# Patient Record
Sex: Female | Born: 1950 | Race: White | Hispanic: No | Marital: Married | Smoking: Never smoker
Health system: Southern US, Community
[De-identification: ages and names within clinical notes are randomized; demographics above are authoritative.]

## PROBLEM LIST (undated history)

## (undated) DIAGNOSIS — J189 Pneumonia, unspecified organism: Secondary | ICD-10-CM

## (undated) DIAGNOSIS — M8580 Other specified disorders of bone density and structure, unspecified site: Secondary | ICD-10-CM

## (undated) DIAGNOSIS — Z8719 Personal history of other diseases of the digestive system: Secondary | ICD-10-CM

## (undated) DIAGNOSIS — G43909 Migraine, unspecified, not intractable, without status migrainosus: Secondary | ICD-10-CM

## (undated) DIAGNOSIS — R569 Unspecified convulsions: Secondary | ICD-10-CM

## (undated) DIAGNOSIS — E785 Hyperlipidemia, unspecified: Secondary | ICD-10-CM

## (undated) DIAGNOSIS — J479 Bronchiectasis, uncomplicated: Secondary | ICD-10-CM

## (undated) DIAGNOSIS — J449 Chronic obstructive pulmonary disease, unspecified: Secondary | ICD-10-CM

## (undated) DIAGNOSIS — A310 Pulmonary mycobacterial infection: Secondary | ICD-10-CM

## (undated) DIAGNOSIS — T7840XA Allergy, unspecified, initial encounter: Secondary | ICD-10-CM

## (undated) HISTORY — DX: Bronchiectasis, uncomplicated: J47.9

## (undated) HISTORY — DX: Unspecified convulsions: R56.9

## (undated) HISTORY — DX: Personal history of other diseases of the digestive system: Z87.19

## (undated) HISTORY — DX: Migraine, unspecified, not intractable, without status migrainosus: G43.909

## (undated) HISTORY — DX: Pneumonia, unspecified organism: J18.9

## (undated) HISTORY — DX: Chronic obstructive pulmonary disease, unspecified: J44.9

## (undated) HISTORY — DX: Hyperlipidemia, unspecified: E78.5

## (undated) HISTORY — DX: Pulmonary mycobacterial infection: A31.0

## (undated) HISTORY — DX: Allergy, unspecified, initial encounter: T78.40XA

## (undated) HISTORY — PX: EYE SURGERY: SHX253

## (undated) HISTORY — PX: SPINE SURGERY: SHX786

## (undated) HISTORY — PX: BREAST SURGERY: SHX581

## (undated) HISTORY — DX: Other specified disorders of bone density and structure, unspecified site: M85.80

---

## 1981-03-21 HISTORY — PX: CARPAL TUNNEL RELEASE: SHX101

## 1995-03-22 HISTORY — PX: BREAST EXCISIONAL BIOPSY: SUR124

## 2001-05-09 ENCOUNTER — Inpatient Hospital Stay (HOSPITAL_COMMUNITY): Admission: RE | Admit: 2001-05-09 | Discharge: 2001-05-11 | Payer: Self-pay | Admitting: Obstetrics & Gynecology

## 2001-05-09 ENCOUNTER — Encounter (INDEPENDENT_AMBULATORY_CARE_PROVIDER_SITE_OTHER): Payer: Self-pay

## 2002-01-04 ENCOUNTER — Other Ambulatory Visit: Admission: RE | Admit: 2002-01-04 | Discharge: 2002-01-04 | Payer: Self-pay | Admitting: Obstetrics & Gynecology

## 2002-04-26 ENCOUNTER — Encounter: Payer: Self-pay | Admitting: General Surgery

## 2002-04-26 ENCOUNTER — Encounter: Admission: RE | Admit: 2002-04-26 | Discharge: 2002-04-26 | Payer: Self-pay | Admitting: General Surgery

## 2003-03-22 HISTORY — PX: BREAST BIOPSY: SHX20

## 2003-05-02 ENCOUNTER — Other Ambulatory Visit: Admission: RE | Admit: 2003-05-02 | Discharge: 2003-05-02 | Payer: Self-pay | Admitting: Obstetrics & Gynecology

## 2004-02-19 ENCOUNTER — Ambulatory Visit: Payer: Self-pay | Admitting: Family Medicine

## 2004-04-01 ENCOUNTER — Ambulatory Visit: Payer: Self-pay | Admitting: Family Medicine

## 2004-05-27 ENCOUNTER — Ambulatory Visit: Payer: Self-pay

## 2005-02-03 ENCOUNTER — Ambulatory Visit: Payer: Self-pay | Admitting: Family Medicine

## 2005-05-13 ENCOUNTER — Ambulatory Visit: Payer: Self-pay | Admitting: Family Medicine

## 2005-05-17 ENCOUNTER — Ambulatory Visit: Payer: Self-pay | Admitting: Family Medicine

## 2005-06-08 ENCOUNTER — Ambulatory Visit: Payer: Self-pay | Admitting: Family Medicine

## 2005-08-24 ENCOUNTER — Ambulatory Visit: Payer: Self-pay | Admitting: Family Medicine

## 2005-09-15 ENCOUNTER — Ambulatory Visit: Payer: Self-pay | Admitting: Family Medicine

## 2005-09-22 ENCOUNTER — Ambulatory Visit: Payer: Self-pay | Admitting: Family Medicine

## 2005-10-05 ENCOUNTER — Ambulatory Visit: Payer: Self-pay | Admitting: Family Medicine

## 2005-11-03 ENCOUNTER — Ambulatory Visit: Payer: Self-pay

## 2005-11-25 ENCOUNTER — Ambulatory Visit: Payer: Self-pay | Admitting: Family Medicine

## 2006-06-23 ENCOUNTER — Ambulatory Visit: Payer: Self-pay | Admitting: Family Medicine

## 2006-07-28 ENCOUNTER — Telehealth (INDEPENDENT_AMBULATORY_CARE_PROVIDER_SITE_OTHER): Payer: Self-pay | Admitting: *Deleted

## 2006-11-02 ENCOUNTER — Ambulatory Visit: Payer: Self-pay

## 2006-11-30 ENCOUNTER — Ambulatory Visit: Payer: Self-pay | Admitting: Neurosurgery

## 2006-12-13 ENCOUNTER — Telehealth: Payer: Self-pay | Admitting: Family Medicine

## 2006-12-14 ENCOUNTER — Encounter (INDEPENDENT_AMBULATORY_CARE_PROVIDER_SITE_OTHER): Payer: Self-pay | Admitting: *Deleted

## 2007-03-01 DIAGNOSIS — K5730 Diverticulosis of large intestine without perforation or abscess without bleeding: Secondary | ICD-10-CM | POA: Insufficient documentation

## 2007-03-01 DIAGNOSIS — R569 Unspecified convulsions: Secondary | ICD-10-CM | POA: Insufficient documentation

## 2007-03-01 DIAGNOSIS — Z87898 Personal history of other specified conditions: Secondary | ICD-10-CM | POA: Insufficient documentation

## 2007-03-01 DIAGNOSIS — J309 Allergic rhinitis, unspecified: Secondary | ICD-10-CM | POA: Insufficient documentation

## 2007-03-02 ENCOUNTER — Ambulatory Visit: Payer: Self-pay | Admitting: Family Medicine

## 2007-03-02 DIAGNOSIS — J019 Acute sinusitis, unspecified: Secondary | ICD-10-CM | POA: Insufficient documentation

## 2007-03-02 DIAGNOSIS — M5030 Other cervical disc degeneration, unspecified cervical region: Secondary | ICD-10-CM | POA: Insufficient documentation

## 2007-03-02 DIAGNOSIS — M949 Disorder of cartilage, unspecified: Secondary | ICD-10-CM

## 2007-03-02 DIAGNOSIS — M899 Disorder of bone, unspecified: Secondary | ICD-10-CM | POA: Insufficient documentation

## 2007-03-16 ENCOUNTER — Telehealth: Payer: Self-pay | Admitting: Family Medicine

## 2007-05-02 ENCOUNTER — Telehealth: Payer: Self-pay | Admitting: Family Medicine

## 2007-05-10 ENCOUNTER — Ambulatory Visit: Payer: Self-pay | Admitting: Family Medicine

## 2007-05-10 DIAGNOSIS — J441 Chronic obstructive pulmonary disease with (acute) exacerbation: Secondary | ICD-10-CM | POA: Insufficient documentation

## 2007-05-10 DIAGNOSIS — J45901 Unspecified asthma with (acute) exacerbation: Secondary | ICD-10-CM

## 2007-05-14 ENCOUNTER — Telehealth: Payer: Self-pay | Admitting: Family Medicine

## 2007-06-13 ENCOUNTER — Telehealth: Payer: Self-pay | Admitting: Family Medicine

## 2007-06-18 ENCOUNTER — Telehealth: Payer: Self-pay | Admitting: Family Medicine

## 2007-06-21 ENCOUNTER — Telehealth: Payer: Self-pay | Admitting: Family Medicine

## 2007-09-05 ENCOUNTER — Telehealth: Payer: Self-pay | Admitting: Family Medicine

## 2007-11-29 ENCOUNTER — Ambulatory Visit: Payer: Self-pay | Admitting: Obstetrics & Gynecology

## 2008-05-01 ENCOUNTER — Ambulatory Visit: Payer: Self-pay

## 2008-07-24 ENCOUNTER — Ambulatory Visit: Payer: Self-pay

## 2008-12-25 ENCOUNTER — Ambulatory Visit: Payer: Self-pay

## 2009-03-19 ENCOUNTER — Other Ambulatory Visit: Payer: Self-pay | Admitting: Internal Medicine

## 2010-01-07 ENCOUNTER — Ambulatory Visit: Payer: Self-pay | Admitting: Obstetrics & Gynecology

## 2010-01-28 ENCOUNTER — Ambulatory Visit: Payer: Self-pay

## 2010-01-29 ENCOUNTER — Encounter: Admission: RE | Admit: 2010-01-29 | Discharge: 2010-01-29 | Payer: Self-pay | Admitting: Neurosurgery

## 2010-02-25 ENCOUNTER — Other Ambulatory Visit: Payer: Self-pay | Admitting: Internal Medicine

## 2010-03-05 ENCOUNTER — Ambulatory Visit: Payer: Self-pay | Admitting: Internal Medicine

## 2010-07-02 ENCOUNTER — Other Ambulatory Visit: Payer: Self-pay | Admitting: Neurosurgery

## 2010-07-02 DIAGNOSIS — M542 Cervicalgia: Secondary | ICD-10-CM

## 2010-07-02 DIAGNOSIS — IMO0002 Reserved for concepts with insufficient information to code with codable children: Secondary | ICD-10-CM

## 2010-07-09 ENCOUNTER — Other Ambulatory Visit: Payer: Self-pay

## 2010-07-20 ENCOUNTER — Ambulatory Visit
Admission: RE | Admit: 2010-07-20 | Discharge: 2010-07-20 | Disposition: A | Discharge status: Home / Self Care | Payer: BC Managed Care – PPO | Source: Ambulatory Visit | Attending: Neurosurgery | Admitting: Neurosurgery

## 2010-07-20 DIAGNOSIS — M542 Cervicalgia: Secondary | ICD-10-CM

## 2010-07-20 DIAGNOSIS — IMO0002 Reserved for concepts with insufficient information to code with codable children: Secondary | ICD-10-CM

## 2010-08-06 NOTE — Op Note (Signed)
[facility]  Patient:    Barbara Odom, MODE Visit Number: [id number] MRN: [id number]          Service Type: Attending:  Genia Del, M.D. Dictated by:   Genia Del, M.D. Proc. 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  INTERVENTION:                 Uterine myomectomy with cauterization of pelvic endometriosis.  SURGEON:                      Genia Del, M.D.  ASSISTANT:                    Cordelia Pen A. Rosalio Macadamia, M.D.  ANESTHESIOLOGIST:             Belva Agee, M.D.  PROCEDURE:                    Under general anesthesia the patient is in dorsal decubitus position.  She is prepped with Betadine on the abdominal, suprapubic, vulvar, and vaginal areas.  A bladder catheter is inserted and the patient is draped as usual.  We make a Pfannenstiel incision with a scalpel. We then open the aponeurosis transversely with electrocautery and Mayo scissors.  We release the aponeurosis from the recti muscle on the midline. We open the parietoperitoneum longitudinally with the Metzenbaum scissors.  An abdominal, pelvic exploration is done.  The liver surfaces are regular and smooth.  The kidneys are normal.  No periaortic lymph node is palpable.  No pathology is felt in the abdominal area.  In the pelvic area a large anterior myoma is present pushing the uterus in a retroverted position.  Both tubes are normal.  Both ovaries are normal except for a small endometrioma on the right side and superficial endometriosis on the left side.  Small lesions of endometriosis are also present superficially at the level of the right cornua and on the posterior aspect  of the uterus.  After retracting the bowels upward with laps and putting in place the Balfour retractor with the bladder blade, we inject the anterior aspect of the uterus with Vasopressin 20 and 30.  We then follow a vertical line from mid anterior to fundal area creating a vertical hysterotomy going through the myometrium down to the myoma. Inferiorly we are far away from the bladder.  We insert the corkscrew at the level of the myoma and started dissecting with the finger and Metzenbaum scissors to free the myoma.  At the deepest area the myoma is adherent to the endometrium.  We find a good plane freeing the myoma from the endometrium. The myoma is removed in one piece and sent to pathology after taking a picture.  We then evaluate the integrity of the endometrium.  It was perforated at the location which was adherent to the myoma.  This was closed with a running locked suture with Vicryl 2-0.  We then fix the endometrium  in its normal anatomic position.  Before closing the endometrium, the integrity of the uterine cavity and the communication to both tubes was verified and adequate.  We then made a few figure-of-eight stitches on the inside of the myometrium to reduce the space there and hopefully reduce the risk of hematoma formation.  We then close the myometrium with figure-of-eight with Vicryl 0. Hemostasis was adequate at that level.  We then made a baseball stitch with Monocryl 2-0 to close the serosa.  Hemostasis was adequate at that level. Cauterization of endometriosis as described at the beginning of this protocol was achieved.  Irrigation and suction of the pelvic cavity was done.  The laps were removed as well as the retractor.  We then close the aponeurosis by two half running suture of Vicryl 0.  Hemostasis was completed at the level of the adipose tissue.  Marcaine 0.25% plain a total of 21 cc was injected subcutaneously.  About half of it was injected at the beginning of  the surgery and the rest at the end of the surgery.  We then close the skin with subcuticular suture with Monocryl 4-0.  The estimated blood loss was 100 cc. The count of sponges and instruments was complete x2.  No complications occurred.  The patient was transferred to recovery room in good status. Dictated by:   Genia Del, M.D. Attending:  Genia Del, M.D. DD:  05/09/01 TD:  05/09/01 Job: [id number]

## 2010-08-06 NOTE — Discharge Summary (Signed)
[facility]  Patient:    Barbara Odom, Barbara Odom Visit Number: [id number] MRN: [id number]          Service Type: GYN Location: 910A [id number] Attending Physician:  Genia Del Dictated by:   Genia Del, M.D. Admit Date:  05/09/2001 Discharge Date: 05/11/2001                             Discharge Summary  ADMITTING DIAGNOSIS:          Symptomatic uterine myoma.  DISCHARGE DIAGNOSES:          1. Symptomatic uterine myoma.                               2. Intramural uterine myoma.                               3. Mild pelvic endometriosis.  OPERATION:                    Uterine myomectomy with cauterization of                               pelvic endometriosis on May 09, 2001.  HOSPITAL COURSE:              The patient was admitted May 09, 2001 for uterine myomectomy by laparotomy. She had an anterior intramural myoma causing pressure and pain in the suprapubic area and menorrhagia. The surgery was performed May 09, 2001. A Pfannenstiel incision was done. An intramural myoma was present measuring about 6 to 7 cm. It was excised by a vertical incision using vasopressin. Both tubes were normal. The ovary on the right side was showing a small endometrioma which was opened and cauterized. Superficial endometriosis was cauterized on the left ovary. She also had a superficial endometriotic lesion on the right cornua and on the posterior aspect of the uterus which were all cauterized. Estimated blood loss was 100 cc. No complication occurred.  On postoperative day #1, the patient was hemodynamically stable but feeling quite nauseated and was vomiting. On exam, no evidence of ileus was present. Her symptoms were therefore attributed most likely to her PCA Dilaudid pump which was stopped.  Her preoperative hemoglobin was 9.8. Her postoperative hemoglobin was 7.6 and a control done about eight hours later was 8.2. The patient was  discharged home on postoperative day #2 in good status. She was discharged home with advice.  DISCHARGE MEDICATIONS:        Iron sulfate, Tylox, and Ibuprofen p.r.n.  DISCHARGE FOLLOWUP:           The patient will follow up at the office in three to four weeks. Dictated by:   Genia Del, M.D. Attending Physician:  Genia Del DD:  05/25/01 TD:  05/28/01 Job: [id number]

## 2010-09-24 ENCOUNTER — Ambulatory Visit: Payer: Self-pay | Admitting: Neurosurgery

## 2010-10-20 HISTORY — PX: CERVICAL DISCECTOMY: SHX98

## 2010-11-13 LAB — HM COLONOSCOPY: HM COLON: NORMAL

## 2010-12-07 ENCOUNTER — Other Ambulatory Visit: Payer: Self-pay | Admitting: *Deleted

## 2010-12-07 NOTE — Telephone Encounter (Signed)
Patient has not been seen by this practice in quite some time.  I checked [facility].  Last entry was 2009.

## 2010-12-07 NOTE — Telephone Encounter (Signed)
That is too long ago - I would deny it - would need to re establish

## 2010-12-16 ENCOUNTER — Other Ambulatory Visit: Payer: Self-pay | Admitting: *Deleted

## 2010-12-20 ENCOUNTER — Other Ambulatory Visit: Payer: Self-pay | Admitting: Internal Medicine

## 2010-12-20 MED ORDER — FLUTICASONE-SALMETEROL 100-50 MCG/DOSE IN AEPB: 1.0000 | INHALATION_SPRAY | Freq: Two times a day (BID) | RESPIRATORY_TRACT | Status: DC

## 2011-01-12 ENCOUNTER — Other Ambulatory Visit: Payer: Self-pay | Admitting: Family Medicine

## 2011-01-12 MED ORDER — RIZATRIPTAN BENZOATE 10 MG PO TABS: 10.0000 mg | ORAL_TABLET | Freq: Once | ORAL | Status: DC | PRN

## 2011-01-12 NOTE — Telephone Encounter (Signed)
Call patient when the medication is sent so she can pay for it . Please send RX to [facility]) fax # [phone].

## 2011-01-21 ENCOUNTER — Other Ambulatory Visit: Payer: Self-pay | Admitting: Internal Medicine

## 2011-01-21 NOTE — Telephone Encounter (Signed)
[phone]   Pt needs refill on phenobarb 60mg  1 day last refill 10/11/10 90 day supply [facility]   Fax #[phone] Pt has appointment for cpx 12/5  Pt only has 12 pills left

## 2011-01-26 MED ORDER — PHENOBARBITAL 60 MG PO TABS: 60.0000 mg | ORAL_TABLET | Freq: Every day | ORAL | Status: DC

## 2011-01-26 NOTE — Telephone Encounter (Signed)
Pt will be out of med before mail order will arrive, can 30 day supply be called into local pharm? [facility] [address]

## 2011-01-28 ENCOUNTER — Telehealth: Payer: Self-pay | Admitting: Internal Medicine

## 2011-01-28 MED ORDER — PHENOBARBITAL 60 MG PO TABS: 60.0000 mg | ORAL_TABLET | Freq: Every day | ORAL | Status: DC

## 2011-01-28 NOTE — Telephone Encounter (Signed)
[facility]

## 2011-01-28 NOTE — Telephone Encounter (Signed)
Patient called in today to let us know she is down to 5 pills of her phenobarb 60mg , she states that she has spoken with Zella Ball on 2 different occasions asking this medication to be called into [facility] their number is [phone].  I did see documentation that the medication had been printed, but not sure if it was called/faxed in to pharmacy.  Patient is requesting 15 pills [facility].  I apologize for the delay, I have asked Maralyn Sago to call into pharmacy for patient.

## 2011-01-28 NOTE — Telephone Encounter (Signed)
I faxed her 90 day Rx to [facility] this morning.

## 2011-01-28 NOTE — Telephone Encounter (Signed)
I advised patient that her medication had been called in.  And that her 90 day supply had been faxed into [facility].

## 2011-02-23 ENCOUNTER — Encounter: Payer: Self-pay | Admitting: Internal Medicine

## 2011-02-23 ENCOUNTER — Ambulatory Visit (INDEPENDENT_AMBULATORY_CARE_PROVIDER_SITE_OTHER): Payer: BC Managed Care – PPO | Admitting: Internal Medicine

## 2011-02-23 DIAGNOSIS — G4700 Insomnia, unspecified: Secondary | ICD-10-CM

## 2011-02-23 DIAGNOSIS — Z124 Encounter for screening for malignant neoplasm of cervix: Secondary | ICD-10-CM | POA: Insufficient documentation

## 2011-02-23 DIAGNOSIS — R197 Diarrhea, unspecified: Secondary | ICD-10-CM

## 2011-02-23 DIAGNOSIS — M545 Low back pain, unspecified: Secondary | ICD-10-CM

## 2011-02-23 DIAGNOSIS — Z1211 Encounter for screening for malignant neoplasm of colon: Secondary | ICD-10-CM | POA: Insufficient documentation

## 2011-02-23 DIAGNOSIS — R569 Unspecified convulsions: Secondary | ICD-10-CM

## 2011-02-23 DIAGNOSIS — J069 Acute upper respiratory infection, unspecified: Secondary | ICD-10-CM

## 2011-02-23 DIAGNOSIS — N390 Urinary tract infection, site not specified: Secondary | ICD-10-CM

## 2011-02-23 DIAGNOSIS — F419 Anxiety disorder, unspecified: Secondary | ICD-10-CM

## 2011-02-23 DIAGNOSIS — M5030 Other cervical disc degeneration, unspecified cervical region: Secondary | ICD-10-CM

## 2011-02-23 MED ORDER — PROPRANOLOL HCL 20 MG PO TABS: 20.0000 mg | ORAL_TABLET | Freq: Three times a day (TID) | ORAL | Status: DC

## 2011-02-23 MED ORDER — ZOLPIDEM TARTRATE 10 MG PO TABS: 10.0000 mg | ORAL_TABLET | Freq: Every evening | ORAL | Status: DC | PRN

## 2011-02-23 MED ORDER — AZITHROMYCIN 500 MG PO TABS: 500.0000 mg | ORAL_TABLET | Freq: Every day | ORAL | Status: AC

## 2011-02-23 MED ORDER — HYOSCYAMINE SULFATE 0.125 MG SL SUBL: 0.1250 mg | SUBLINGUAL_TABLET | Freq: Every day | SUBLINGUAL | Status: DC | PRN

## 2011-02-23 MED ORDER — CIPROFLOXACIN HCL 250 MG PO TABS: 250.0000 mg | ORAL_TABLET | Freq: Two times a day (BID) | ORAL | Status: AC

## 2011-02-23 MED ORDER — METRONIDAZOLE 500 MG PO TABS: 500.0000 mg | ORAL_TABLET | Freq: Three times a day (TID) | ORAL | Status: AC

## 2011-02-23 NOTE — Assessment & Plan Note (Addendum)
S/p successful surgery Summer 2012 by Ernest Botero.    

## 2011-02-26 ENCOUNTER — Encounter: Payer: Self-pay | Admitting: Internal Medicine

## 2011-02-26 DIAGNOSIS — M8580 Other specified disorders of bone density and structure, unspecified site: Secondary | ICD-10-CM | POA: Insufficient documentation

## 2011-02-26 DIAGNOSIS — G43909 Migraine, unspecified, not intractable, without status migrainosus: Secondary | ICD-10-CM | POA: Insufficient documentation

## 2011-02-26 DIAGNOSIS — G40909 Epilepsy, unspecified, not intractable, without status epilepticus: Secondary | ICD-10-CM | POA: Insufficient documentation

## 2011-02-26 DIAGNOSIS — M810 Age-related osteoporosis without current pathological fracture: Secondary | ICD-10-CM | POA: Insufficient documentation

## 2011-02-26 DIAGNOSIS — M545 Low back pain, unspecified: Secondary | ICD-10-CM | POA: Insufficient documentation

## 2011-02-26 DIAGNOSIS — Z8719 Personal history of other diseases of the digestive system: Secondary | ICD-10-CM | POA: Insufficient documentation

## 2011-02-26 NOTE — Assessment & Plan Note (Signed)
She continues to take low dose phenarbital for history of seizure disorder as an adolescent.

## 2011-02-26 NOTE — Assessment & Plan Note (Signed)
Her pain is moderate and radiates to buttocks but not below.  Exam is normal.  She is requesting a Medrol dose pack since she has had no relief with analgesics.

## 2011-02-26 NOTE — Progress Notes (Signed)
  Subjective:    Patient ID: Barbara Odom, female    DOB: May 22, 1950, 60 y.o.   MRN: [id number]  HPI  Ms. Moder is a healthy 60 yo white female with a history of migraine headaches and cervicalgia with bilateral radiculopathy who presents for follow up of medical conditions.  She underwent cervical diskectomy in August by Hilda Lias with complete resolution of pain and radiculopathy.  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  She has been pain free until a few days ago when she developed low back pain after doing some heavy labor in the yard,  She has had persistent and decreased ROM for the past week. No radiculopathy.  Past Medical History  Diagnosis Date  . 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 seasonal rhinitis   . Migraine headache   . History of diverticulitis of colon   . Osteopenia     Current Outpatient Prescriptions on File Prior to Visit  Medication Sig Dispense Refill  . Fluticasone-Salmeterol (ADVAIR DISKUS) 100-50 MCG/DOSE AEPB Inhale 1 puff into the lungs 2 (two) times daily.  60 each  3  . PHENObarbital (LUMINAL) 60 MG tablet Take 1 tablet (60 mg total) by mouth daily.  30 tablet  0  . rizatriptan (MAXALT) 10 MG tablet Take 1 tablet (10 mg total) by mouth once as needed for migraine. May repeat in 2 hours if needed  30 tablet  2     Review of Systems  Constitutional: Negative for fever, chills and unexpected weight change.  HENT: Negative for hearing loss, ear pain, nosebleeds, congestion, sore throat, facial swelling, rhinorrhea, sneezing, mouth sores, trouble swallowing, neck pain, neck stiffness, voice change, postnasal drip, sinus pressure, tinnitus and ear discharge.   Eyes: Negative for pain, discharge, redness and visual disturbance.  Respiratory: Negative for cough, chest tightness, shortness of breath, wheezing and stridor.   Cardiovascular: Negative for chest pain, palpitations and leg swelling.  Musculoskeletal:  Negative for myalgias and arthralgias.  Skin: Negative for color change and rash.  Neurological: Negative for dizziness, weakness, light-headedness and headaches.  Hematological: Negative for adenopathy.       Objective:   Physical Exam  Constitutional: She is oriented to person, place, and time. She appears well-developed and well-nourished.  HENT:  Mouth/Throat: Oropharynx is clear and moist.  Eyes: EOM are normal. Pupils are equal, round, and reactive to light. No scleral icterus.  Neck: Normal range of motion. Neck supple. No JVD present. No thyromegaly present.  Cardiovascular: Normal rate, regular rhythm, normal heart sounds and intact distal pulses.   Pulmonary/Chest: Effort normal and breath sounds normal.  Abdominal: Soft. Bowel sounds are normal. She exhibits no mass. There is no tenderness.  Musculoskeletal: Normal range of motion. She exhibits no edema.  Lymphadenopathy:    She has no cervical adenopathy.  Neurological: She is alert and oriented to person, place, and time.  Skin: Skin is warm and dry.  Psychiatric: She has a normal mood and affect.          Assessment & Plan:

## 2011-02-28 ENCOUNTER — Telehealth: Payer: Self-pay | Admitting: Internal Medicine

## 2011-02-28 ENCOUNTER — Other Ambulatory Visit: Payer: Self-pay | Admitting: Internal Medicine

## 2011-02-28 DIAGNOSIS — M549 Dorsalgia, unspecified: Secondary | ICD-10-CM

## 2011-02-28 MED ORDER — PREDNISONE (PAK) 10 MG PO TABS: ORAL_TABLET | ORAL | Status: DC

## 2011-02-28 NOTE — Telephone Encounter (Signed)
Patient called and stated at her office visit she thought [facility]  She stated she went over the weekend to pick it up and it wasn't there.  Please advise.

## 2011-03-01 MED ORDER — ZOLPIDEM TARTRATE 5 MG PO TABS: 5.0000 mg | ORAL_TABLET | Freq: Every evening | ORAL | Status: DC | PRN

## 2011-03-01 NOTE — Progress Notes (Signed)
Addended by: Duncan Dull on: 03/01/2011 12:23 PM   Modules accepted: Orders

## 2011-03-07 ENCOUNTER — Other Ambulatory Visit: Payer: Self-pay | Admitting: Internal Medicine

## 2011-03-07 DIAGNOSIS — M549 Dorsalgia, unspecified: Secondary | ICD-10-CM

## 2011-03-07 MED ORDER — PREDNISONE (PAK) 10 MG PO TABS: ORAL_TABLET | ORAL | Status: AC

## 2011-03-17 ENCOUNTER — Encounter: Payer: Self-pay | Admitting: Internal Medicine

## 2011-05-12 ENCOUNTER — Other Ambulatory Visit: Payer: Self-pay | Admitting: Internal Medicine

## 2011-05-12 LAB — COMPREHENSIVE METABOLIC PANEL
Albumin: 4.1 g/dL (ref 3.4–5.0)
Alkaline Phosphatase: 44 U/L — ABNORMAL LOW (ref 50–136)
BUN: 17 mg/dL (ref 7–18)
Bilirubin,Total: 0.4 mg/dL (ref 0.2–1.0)
Calcium, Total: 8.8 mg/dL (ref 8.5–10.1)
Chloride: 103 mmol/L (ref 98–107)
Creatinine: 0.76 mg/dL (ref 0.60–1.30)
EGFR (Non-African Amer.): >60
Osmolality: 285 (ref 275–301)
SGPT (ALT): 21 U/L
Sodium: 143 mmol/L (ref 136–145)

## 2011-05-12 LAB — LIPID PANEL
HDL Cholesterol: 81 mg/dL — ABNORMAL HIGH (ref 40–60)
Ldl Cholesterol, Calc: 103 mg/dL — ABNORMAL HIGH (ref 0–100)
Triglycerides: 40 mg/dL (ref 0–200)

## 2011-05-16 ENCOUNTER — Telehealth: Payer: Self-pay | Admitting: Internal Medicine

## 2011-05-16 NOTE — Telephone Encounter (Signed)
Patient notified of results.

## 2011-05-16 NOTE — Telephone Encounter (Signed)
Cholesterol is fine,  Potassium is tiny bit low at 3.4 .  Recommend increase her sweet potato and orange juice for a few days.

## 2011-05-30 ENCOUNTER — Encounter: Payer: Self-pay | Admitting: Internal Medicine

## 2011-06-10 ENCOUNTER — Encounter: Payer: Self-pay | Admitting: Internal Medicine

## 2011-07-01 ENCOUNTER — Telehealth: Payer: Self-pay | Admitting: Internal Medicine

## 2011-07-01 ENCOUNTER — Other Ambulatory Visit: Payer: Self-pay | Admitting: Internal Medicine

## 2011-07-01 DIAGNOSIS — B3731 Acute candidiasis of vulva and vagina: Secondary | ICD-10-CM

## 2011-07-01 DIAGNOSIS — B373 Candidiasis of vulva and vagina: Secondary | ICD-10-CM

## 2011-07-01 DIAGNOSIS — J40 Bronchitis, not specified as acute or chronic: Secondary | ICD-10-CM

## 2011-07-01 MED ORDER — PREDNISONE (PAK) 10 MG PO TABS: ORAL_TABLET | ORAL | Status: DC

## 2011-07-01 MED ORDER — FLUCONAZOLE 150 MG PO TABS: 150.0000 mg | ORAL_TABLET | Freq: Every day | ORAL | Status: AC

## 2011-07-01 NOTE — Telephone Encounter (Signed)
Taken care of

## 2011-07-01 NOTE — Telephone Encounter (Signed)
Triage Record Num: [id number] Operator: Lesli Albee Patient Name: Barbara Odom Call Date & Time: 07/01/2011 10:27:49AM Patient Phone: [phone] PCP: Duncan Dull Patient Gender: Female PCP Fax : [phone] Patient DOB: Aug 12, 1950 Practice Name: [facility] Reason for Call: Caller: [name]/Patient; PCP: Duncan Dull; CB#: [phone]; ; ; Call regarding Getting Yeast Infection From Antibiotics, Can Diflucan Be Called In?; Pt started with a zpac yesterday for bronchitis . Pt had a script on hand by Dr. Darrick Huntsman to start the next time she got bronchitis. Pt started it yesterday. She always gets a yeast infection so wanted to go ahead and be proactive and start the Diflucan now/or have it on hand. 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 Rn sent request to the office. [facility] [phone]. OFFICE PLEASE NOTE REQUEST FOR MEDICATION FOR PT Protocol(s) Used: Medication Questions - Adult Recommended Outcome per Protocol: Speak with Provider or Pharmacist within 24 hours Reason for Outcome: Requests refill of prescribed medication with valid refills; lack of medications does not put patient at clinical risk Care Advice: ~ 04/    Per Dr. Darrick Huntsman this has been taken care of.

## 2011-07-01 NOTE — Telephone Encounter (Signed)
Caller: Barbara Odom/Patient; PCP: Duncan Dull; CB#: [phone]; ; ; Call regarding Getting Yeast Infection From Antibiotics, Can Diflucan Be Called In?;  Pt started with a zpac yesterday for bronchitis . Pt had a script on hand  by Dr. Darrick Huntsman to start the next time she got bronchitis. Pt started it yesterday. She always gets a yeast infection so wanted to go ahead and be proactive and start the Diflucan now/or have it on hand. 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 Rn sent request to the office. [facility] [phone].

## 2011-07-08 ENCOUNTER — Ambulatory Visit: Payer: Self-pay | Admitting: Internal Medicine

## 2011-07-08 ENCOUNTER — Ambulatory Visit (INDEPENDENT_AMBULATORY_CARE_PROVIDER_SITE_OTHER): Payer: BC Managed Care – PPO | Admitting: Internal Medicine

## 2011-07-08 ENCOUNTER — Encounter: Payer: Self-pay | Admitting: Internal Medicine

## 2011-07-08 VITALS — BP 136/70 | HR 92 | Temp 98.9°F | Resp 18 | Wt 134.0 lb

## 2011-07-08 DIAGNOSIS — R059 Cough, unspecified: Secondary | ICD-10-CM

## 2011-07-08 DIAGNOSIS — J209 Acute bronchitis, unspecified: Secondary | ICD-10-CM

## 2011-07-08 DIAGNOSIS — R05 Cough: Secondary | ICD-10-CM

## 2011-07-08 MED ORDER — HYDROCOD POLST-CHLORPHEN POLST 10-8 MG/5ML PO LQCR: 5.0000 mL | Freq: Two times a day (BID) | ORAL | Status: DC | PRN

## 2011-07-08 MED ORDER — DOXYCYCLINE HYCLATE 100 MG PO TABS: 100.0000 mg | ORAL_TABLET | Freq: Two times a day (BID) | ORAL | Status: DC

## 2011-07-08 NOTE — Progress Notes (Signed)
Patient ID: Barbara Odom, female   DOB: 1950-10-23, 61 y.o.   MRN: [id number]    Patient Active Problem List  Diagnoses  . SINUSITIS- ACUTE-NOS  . ACUTE BRONCHITIS  . ALLERGIC RHINITIS  . DIVERTICULOSIS, COLON  . DISC DISEASE, CERVICAL  . OSTEOPENIA  . SEIZURE DISORDER  . MIGRAINES, HX OF  . Screening for colon cancer  . Screening for cervical cancer  . grand mal  . Migraine headache  . History of diverticulitis of colon  . Osteopenia  . Lumbago without sciatica    Subjective:  CC:   Chief Complaint  Patient presents with  . Bronchitis    x 3 weeks    HPI:   Barbara Odom a 61 y.o. female who presents with persistent cough.  She has had 3 weeks of coughing followed by wheezing and production of green sputum at which time she started started azithromycin.  She has had  had 6 doses thus far with no improvement in symptoms, and finished a prednisone taper 3 days ago .  Chest tightness and discomfort in the right upper lobe.  She continues to have purulent sputum.  No sick contacts.     Past Medical History  Diagnosis Date  . 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 seasonal rhinitis   . Migraine headache   . History of diverticulitis of colon   . Osteopenia     Past Surgical History  Procedure Date  . Carpal tunnel release 1983    bilateral   . Breast surgery     lumpectomy, benign   . Cervical discectomy August 2012    Botero         The following portions of the patient's history were reviewed and updated as appropriate: Allergies, current medications, and problem list.    Review of Systems:   12 Pt  review of systems was negative except those addressed in the HPI,     History   Social History  . Marital Status: Married    Spouse Name: N/A    Number of Children: N/A  . Years of Education: N/A   Occupational History  . Not on file.   Social History Main Topics  . Smoking status: Never Smoker   . Smokeless  tobacco: Never Used  . Alcohol Use: Yes  . Drug Use: No  . Sexually Active: Not on file   Other Topics Concern  . Not on file   Social History Narrative  . No narrative on file    Objective:  BP 136/70  Pulse 92  Temp(Src) 98.9 F (37.2 C) (Oral)  Resp 18  Wt 134 lb (60.782 kg)  SpO2 99%  General appearance: alert, cooperative and appears stated age Ears: normal TM's and external ear canals both ears Throat: lips, mucosa, and tongue normal; teeth and gums normal Neck: no adenopathy, no carotid bruit, supple, symmetrical, trachea midline and thyroid not enlarged, symmetric, no tenderness/mass/nodules Back: symmetric, no curvature. ROM normal. No CVA tenderness. Lungs: clear to auscultation bilaterally Heart: regular rate and rhythm, S1, S2 normal, no murmur, click, rub or gallop Abdomen: soft, non-tender; bowel sounds normal; no masses,  no organomegaly Pulses: 2+ and symmetric Skin: Skin color, texture, turgor normal. No rashes or lesions Lymph nodes: Cervical, supraclavicular, and axillary nodes normal.  Assessment and Plan:  ACUTE BRONCHITIS Given her persistent symptoms and RUL discomfort, will send for chest x ray to rule out pneumonia.  Rx doxycycline, Symbicort  for wheezing and chest tightness.     Updated Medication List Outpatient Encounter Prescriptions as of 07/08/2011  Medication Sig Dispense Refill  . b complex vitamins tablet Take 1 tablet by mouth daily.        Marland Kitchen BLACK COHOSH PO Take by mouth.        . cetirizine (ZYRTEC) 10 MG tablet Take 10 mg by mouth daily.        . Cholecalciferol (VITAMIN D3) 1000 UNITS CAPS Take by mouth.      . citalopram (CELEXA) 10 MG tablet Take 10 mg by mouth daily.        . cyclobenzaprine (FLEXERIL) 5 MG tablet Take 5 mg by mouth 3 (three) times daily as needed.        . Fluticasone-Salmeterol (ADVAIR DISKUS) 100-50 MCG/DOSE AEPB Inhale 1 puff into the lungs 2 (two) times daily.  60 each  3  . hyoscyamine (LEVSIN SL) 0.125  MG SL tablet Place 1 tablet (0.125 mg total) under the tongue daily as needed.  90 tablet  2  . Multiple Vitamin (MULTIVITAMIN) tablet Take 1 tablet by mouth daily.        Marland Kitchen PHENObarbital (LUMINAL) 60 MG tablet Take 1 tablet (60 mg total) by mouth daily.  30 tablet  0  . propranolol (INDERAL) 20 MG tablet Take 1 tablet (20 mg total) by mouth 3 (three) times daily.  10 tablet  0  . rizatriptan (MAXALT) 10 MG tablet Take 1 tablet (10 mg total) by mouth once as needed for migraine. May repeat in 2 hours if needed  30 tablet  2  . zolpidem (AMBIEN) 5 MG tablet Take 1 tablet (5 mg total) by mouth at bedtime as needed.  30 tablet  1  . chlorpheniramine-HYDROcodone (TUSSIONEX) 10-8 MG/5ML LQCR Take 5 mLs by mouth every 12 (twelve) hours as needed.  200 mL  1  . doxycycline (VIBRA-TABS) 100 MG tablet Take 1 tablet (100 mg total) by mouth 2 (two) times daily.  14 tablet  0  . DISCONTD: predniSONE (STERAPRED UNI-PAK) 10 MG tablet 6 tablets on Day 1 , then reduce by 1 tablet daily until gone  21 tablet  0     Orders Placed This Encounter  Procedures  . DG Chest 2 View    No Follow-up on file.

## 2011-07-08 NOTE — Patient Instructions (Signed)
Doxycycline 100 mg twice daily for 7 days  Change advair to symbicort 2 puffs twice daily for 1 month  Use saline lavage twice daily

## 2011-07-10 ENCOUNTER — Encounter: Payer: Self-pay | Admitting: Internal Medicine

## 2011-07-10 NOTE — Assessment & Plan Note (Addendum)
Given her persistent symptoms and RUL discomfort, will send for chest x ray to rule out pneumonia.  Rx doxycycline, Symbicort for wheezing and chest tightness.

## 2011-07-14 ENCOUNTER — Encounter: Payer: Self-pay | Admitting: Internal Medicine

## 2011-07-15 ENCOUNTER — Ambulatory Visit (INDEPENDENT_AMBULATORY_CARE_PROVIDER_SITE_OTHER): Payer: BC Managed Care – PPO | Admitting: Internal Medicine

## 2011-07-15 ENCOUNTER — Encounter: Payer: Self-pay | Admitting: Internal Medicine

## 2011-07-15 VITALS — BP 137/81 | HR 92 | Ht 66.5 in | Wt 131.0 lb

## 2011-07-15 DIAGNOSIS — J40 Bronchitis, not specified as acute or chronic: Secondary | ICD-10-CM

## 2011-07-15 MED ORDER — DOXYCYCLINE HYCLATE 100 MG PO TABS: 100.0000 mg | ORAL_TABLET | Freq: Two times a day (BID) | ORAL | Status: AC

## 2011-07-15 MED ORDER — PREDNISONE (PAK) 10 MG PO TABS: ORAL_TABLET | ORAL | Status: AC

## 2011-07-15 NOTE — Assessment & Plan Note (Signed)
Symptoms have improved somewhat, however exam remarkable for prolonged expiratory phase with diffuse expiratory wheezes. Will add prednisone taper and continue doxycycline. Will continue albuterol as needed and scheduled Symbicort. Followup in 2 weeks.

## 2011-07-15 NOTE — Progress Notes (Signed)
Subjective:    Patient ID: Barbara Odom, female    DOB: 1951/01/22, 61 y.o.   MRN: [id number]  HPI 61 year old female presents to followup recent episode of bronchitis. She was treated with doxycycline, inhaled albuterol, and inhaled Symbicort. She reports gradual improvement in her shortness of breath and cough. She continues to have some shortness of breath with minimal exertion. She finds this to be exacerbated by exposure to outdoor allergens. She is not a smoker. Notably, she had chest x-ray after her last visit which showed hyperinflation but no infiltrate. She denies any recent fever or chills.  Outpatient Encounter Prescriptions as of 07/15/2011  Medication Sig Dispense Refill  . budesonide-formoterol (SYMBICORT) 160-4.5 MCG/ACT inhaler Inhale 2 puffs into the lungs 2 (two) times daily.      Marland Kitchen b complex vitamins tablet Take 1 tablet by mouth daily.        Marland Kitchen BLACK COHOSH PO Take by mouth.        . cetirizine (ZYRTEC) 10 MG tablet Take 10 mg by mouth daily.        . chlorpheniramine-HYDROcodone (TUSSIONEX) 10-8 MG/5ML LQCR Take 5 mLs by mouth every 12 (twelve) hours as needed.  200 mL  1  . Cholecalciferol (VITAMIN D3) 1000 UNITS CAPS Take by mouth.      . citalopram (CELEXA) 10 MG tablet Take 10 mg by mouth daily.        Marland Kitchen doxycycline (VIBRA-TABS) 100 MG tablet Take 1 tablet (100 mg total) by mouth 2 (two) times daily.  14 tablet  0  . Fluticasone-Salmeterol (ADVAIR DISKUS) 100-50 MCG/DOSE AEPB Inhale 1 puff into the lungs 2 (two) times daily.  60 each  3  . hyoscyamine (LEVSIN SL) 0.125 MG SL tablet Place 1 tablet (0.125 mg total) under the tongue daily as needed.  90 tablet  2  . Multiple Vitamin (MULTIVITAMIN) tablet Take 1 tablet by mouth daily.        Marland Kitchen PHENObarbital (LUMINAL) 60 MG tablet Take 1 tablet (60 mg total) by mouth daily.  30 tablet  0  . predniSONE (STERAPRED UNI-PAK) 10 MG tablet Take 60mg  day 1 then taper by 10mg  daily  21 tablet  0  . rizatriptan (MAXALT) 10 MG  tablet Take 1 tablet (10 mg total) by mouth once as needed for migraine. May repeat in 2 hours if needed  30 tablet  2  . zolpidem (AMBIEN) 5 MG tablet Take 1 tablet (5 mg total) by mouth at bedtime as needed.  30 tablet  1   BP 137/81  Pulse 92  Ht 5' 6.5" (1.689 m)  Wt 131 lb (59.421 kg)  BMI 20.83 kg/m2  SpO2 97%  Review of Systems  Constitutional: Negative for fever, chills, appetite change, fatigue and unexpected weight change.  HENT: Negative for ear pain, congestion, sore throat, trouble swallowing, neck pain, voice change and sinus pressure.   Eyes: Negative for visual disturbance.  Respiratory: Positive for cough, shortness of breath and wheezing. Negative for stridor.   Cardiovascular: Negative for chest pain, palpitations and leg swelling.  Gastrointestinal: Negative for nausea, vomiting, abdominal pain, diarrhea, constipation, blood in stool, abdominal distention and anal bleeding.  Genitourinary: Negative for dysuria and flank pain.  Musculoskeletal: Negative for myalgias, arthralgias and gait problem.  Skin: Negative for color change and rash.  Neurological: Negative for dizziness and headaches.  Hematological: Negative for adenopathy. Does not bruise/bleed easily.  Psychiatric/Behavioral: Negative for suicidal ideas, sleep disturbance and dysphoric mood. The patient is not  nervous/anxious.        Objective:   Physical Exam  Constitutional: She is oriented to person, place, and time. She appears well-developed and well-nourished. No distress.  HENT:  Head: Normocephalic and atraumatic.  Right Ear: External ear normal.  Left Ear: External ear normal.  Nose: Nose normal.  Mouth/Throat: Oropharynx is clear and moist. No oropharyngeal exudate.  Eyes: Conjunctivae are normal. Pupils are equal, round, and reactive to light. Right eye exhibits no discharge. Left eye exhibits no discharge. No scleral icterus.  Neck: Normal range of motion. Neck supple. No tracheal deviation  present. No thyromegaly present.  Cardiovascular: Normal rate, regular rhythm, normal heart sounds and intact distal pulses.  Exam reveals no gallop and no friction rub.   No murmur heard. Pulmonary/Chest: Effort normal. No accessory muscle usage. Not tachypneic. No respiratory distress. She has decreased breath sounds (prolonged expiration). She has wheezes. She has no rales. She exhibits no tenderness.  Musculoskeletal: Normal range of motion. She exhibits no edema and no tenderness.  Lymphadenopathy:    She has no cervical adenopathy.  Neurological: She is alert and oriented to person, place, and time. No cranial nerve deficit. She exhibits normal muscle tone. Coordination normal.  Skin: Skin is warm and dry. No rash noted. She is not diaphoretic. No erythema. No pallor.  Psychiatric: She has a normal mood and affect. Her behavior is normal. Judgment and thought content normal.          Assessment & Plan:

## 2011-08-03 ENCOUNTER — Encounter: Payer: Self-pay | Admitting: Internal Medicine

## 2011-08-03 ENCOUNTER — Ambulatory Visit (INDEPENDENT_AMBULATORY_CARE_PROVIDER_SITE_OTHER): Payer: BC Managed Care – PPO | Admitting: Internal Medicine

## 2011-08-03 VITALS — BP 122/60 | HR 90 | Temp 98.3°F | Resp 16 | Wt 133.2 lb

## 2011-08-03 DIAGNOSIS — H6981 Other specified disorders of Eustachian tube, right ear: Secondary | ICD-10-CM

## 2011-08-03 DIAGNOSIS — R569 Unspecified convulsions: Secondary | ICD-10-CM

## 2011-08-03 DIAGNOSIS — H6980 Other specified disorders of Eustachian tube, unspecified ear: Secondary | ICD-10-CM

## 2011-08-03 MED ORDER — CICLESONIDE 50 MCG/ACT NA SUSP: 2.0000 | Freq: Every day | NASAL | Status: DC

## 2011-08-03 MED ORDER — PHENOBARBITAL 60 MG PO TABS: 60.0000 mg | ORAL_TABLET | Freq: Every day | ORAL | Status: DC

## 2011-08-03 MED ORDER — RIZATRIPTAN BENZOATE 10 MG PO TABS: 10.0000 mg | ORAL_TABLET | Freq: Once | ORAL | Status: DC | PRN

## 2011-08-03 MED ORDER — AMOXICILLIN-POT CLAVULANATE 875-125 MG PO TABS: 1.0000 | ORAL_TABLET | Freq: Two times a day (BID) | ORAL | Status: AC

## 2011-08-05 ENCOUNTER — Encounter: Payer: Self-pay | Admitting: Internal Medicine

## 2011-08-05 NOTE — Progress Notes (Addendum)
Patient ID: Barbara Odom, female   DOB: 1951-01-21, 61 y.o.   MRN: [id number] Patient Active Problem List  Diagnoses  . ALLERGIC RHINITIS  . DIVERTICULOSIS, COLON  . DISC DISEASE, CERVICAL  . OSTEOPENIA  . Screening for colon cancer  . Screening for cervical cancer  . Seizure disorder  . Migraine headache  . History of diverticulitis of colon  . Lumbago without sciatica  . Bronchitis  . Eustachian tube dysfunction, right    Subjective:  CC:   No chief complaint on file.   HPI:   Barbara Odom a 61 y.o. female who presents for her 2nd follow up visit for  recent prolonged episode of bronchitis.  She was initially treated with doxycycline for 7 days on April 26, after self treating with azithromycin and steroid taper with failure to resolve.   CXR was normal except for hyperinflation.  She saw Dr. Dan Humphreys 2 weeks ago for persistent productive cough.  Dr. Dan Humphreys continued  the doxycycline and inhaled bronchodilator/steroids and added a prednisone taper   Her wheezing and cough have resolved.  Her right ear however has had a feeling of fullness with occasional pain.  She denies fevers,vertigo, swollen LN, sore throat and malaise.     Past Medical History  Diagnosis Date  . 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 seasonal rhinitis   . Migraine headache   . History of diverticulitis of colon   . Osteopenia     Past Surgical History  Procedure Date  . Carpal tunnel release 1983    bilateral   . Breast surgery     lumpectomy, benign   . Cervical discectomy August 2012    [place]         The following portions of the patient's history were reviewed and updated as appropriate: Allergies, current medications, and problem list.    Review of Systems:   12 Pt  review of systems was negative except those addressed in the HPI,     History   Social History  . Marital Status: Married    Spouse Name: N/A    Number of Children: N/A  . Years of  Education: N/A   Occupational History  . Not on file.   Social History Main Topics  . Smoking status: Never Smoker   . Smokeless tobacco: Never Used  . Alcohol Use: Yes  . Drug Use: No  . Sexually Active: Not on file   Other Topics Concern  . Not on file   Social History Narrative  . No narrative on file    Objective:  BP 122/60  Pulse 90  Temp(Src) 98.3 F (36.8 C) (Oral)  Resp 16  Wt 133 lb 4 oz (60.442 kg)  SpO2 97%  General appearance: alert, cooperative and appears stated age Ears: normal Left TM, Riht TM slightly injected,  Not bulging.  No effusion . Throat: lips, mucosa, and tongue normal; teeth and gums normal Neck: no adenopathy, no carotid bruit, supple, symmetrical, trachea midline and thyroid not enlarged, symmetric, no tenderness/mass/nodules Back: symmetric, no curvature. ROM normal. No CVA tenderness. Lungs: clear to auscultation bilaterally Heart: regular rate and rhythm, S1, S2 normal, no murmur, click, rub or gallop Abdomen: soft, non-tender; bowel sounds normal; no masses,  no organomegaly Pulses: 2+ and symmetric Skin: Skin color, texture, turgor normal. No rashes or lesions Lymph nodes: Cervical, supraclavicular, and axillary nodes normal.  Assessment and Plan:  Eustachian tube dysfunction, right Her right  TM is no erythematous but is full.  No serous effusion noted.  Will treat with steroid inhaler and oral decongestants.    Seizure disorder Since adolescence.  Has been seizure free on low dose phenobarbital with no adverse effects.     Updated Medication List Outpatient Encounter Prescriptions as of 08/03/2011  Medication Sig Dispense Refill  . b complex vitamins tablet Take 1 tablet by mouth daily.        Marland Kitchen BLACK COHOSH PO Take by mouth.        . budesonide-formoterol (SYMBICORT) 160-4.5 MCG/ACT inhaler Inhale 2 puffs into the lungs 2 (two) times daily.      . cetirizine (ZYRTEC) 10 MG tablet Take 10 mg by mouth daily.        .  Cholecalciferol (VITAMIN D3) 1000 UNITS CAPS Take by mouth.      . citalopram (CELEXA) 10 MG tablet Take 10 mg by mouth daily.        . hyoscyamine (LEVSIN SL) 0.125 MG SL tablet Place 1 tablet (0.125 mg total) under the tongue daily as needed.  90 tablet  2  . Multiple Vitamin (MULTIVITAMIN) tablet Take 1 tablet by mouth daily.        Marland Kitchen PHENObarbital (LUMINAL) 60 MG tablet Take 1 tablet (60 mg total) by mouth daily.  90 tablet  3  . rizatriptan (MAXALT) 10 MG tablet Take 1 tablet (10 mg total) by mouth once as needed for migraine. May repeat in 2 hours if needed  30 tablet  2  . zolpidem (AMBIEN) 5 MG tablet Take 1 tablet (5 mg total) by mouth at bedtime as needed.  30 tablet  1  . DISCONTD: PHENObarbital (LUMINAL) 60 MG tablet Take 1 tablet (60 mg total) by mouth daily.  30 tablet  0  . DISCONTD: rizatriptan (MAXALT) 10 MG tablet Take 1 tablet (10 mg total) by mouth once as needed for migraine. May repeat in 2 hours if needed  30 tablet  2  . amoxicillin-clavulanate (AUGMENTIN) 875-125 MG per tablet Take 1 tablet by mouth 2 (two) times daily.  14 tablet  0  . ciclesonide (OMNARIS) 50 MCG/ACT nasal spray Place 2 sprays into both nostrils daily.  12.5 g  0  . DISCONTD: chlorpheniramine-HYDROcodone (TUSSIONEX) 10-8 MG/5ML LQCR Take 5 mLs by mouth every 12 (twelve) hours as needed.  200 mL  1  . DISCONTD: Fluticasone-Salmeterol (ADVAIR DISKUS) 100-50 MCG/DOSE AEPB Inhale 1 puff into the lungs 2 (two) times daily.  60 each  3     No orders of the defined types were placed in this encounter.    No Follow-up on file.

## 2011-08-07 ENCOUNTER — Encounter: Payer: Self-pay | Admitting: Internal Medicine

## 2011-08-07 DIAGNOSIS — H6981 Other specified disorders of Eustachian tube, right ear: Secondary | ICD-10-CM | POA: Insufficient documentation

## 2011-08-07 NOTE — Assessment & Plan Note (Signed)
Since adolescence.  Has been seizure free on low dose phenobarbital with no adverse effects.  

## 2011-08-07 NOTE — Assessment & Plan Note (Signed)
Her right TM is no erythematous but is full.  No serous effusion noted.  Will treat with steroid inhaler and oral decongestants.

## 2011-08-31 ENCOUNTER — Ambulatory Visit: Payer: Self-pay | Admitting: Internal Medicine

## 2011-08-31 ENCOUNTER — Telehealth: Payer: Self-pay | Admitting: Internal Medicine

## 2011-08-31 DIAGNOSIS — H6691 Otitis media, unspecified, right ear: Secondary | ICD-10-CM

## 2011-08-31 DIAGNOSIS — J40 Bronchitis, not specified as acute or chronic: Secondary | ICD-10-CM

## 2011-08-31 MED ORDER — BUDESONIDE-FORMOTEROL FUMARATE 160-4.5 MCG/ACT IN AERO: 2.0000 | INHALATION_SPRAY | Freq: Two times a day (BID) | RESPIRATORY_TRACT | Status: DC

## 2011-08-31 MED ORDER — CICLESONIDE 50 MCG/ACT NA SUSP: 2.0000 | Freq: Every day | NASAL | Status: DC

## 2011-08-31 NOTE — Progress Notes (Deleted)
Patient ID: Barbara Odom, female   DOB: 11/16/1950, 60 y.o.   MRN: [id number] 2nd Follow up visit for  recent prolonged episode of bronchitis.  She was initially treated with doxycycline for 7 days on April 26, after self treating with azithromycin and steroid taper with failure to resolve.   CXR was normal except for hyperinflation.  She saw Dr. Charo 2 weeks ago for persistent productive cough.  Dr. Bugge continued  the doxycycline and inhaled bronchodilator/steroids and added a prednisone taper   Her wheezing and cough have resolved.  Her right ear however has had a feeling of fullness with occasional pain.  She denies fevers,vertigo, wollen LN, sore throat and malaise.   

## 2011-08-31 NOTE — Telephone Encounter (Signed)
Caller: Barbara Odom/Patient; PCP: Duncan Dull; CB#: [phone]; ; Call regarding Already Spoke With Morrie Sheldon in Office and is going to [facility] to be seen; Calling back to Request (new) RX be called now after use of 2 Recent Medication Samples;  Would like RX for Symbicort inhaler and Omnaris [facility] [address] [phone].  Info noted and sent to MD for adult requesting new RX per Med Question Call Guideline.

## 2011-08-31 NOTE — Telephone Encounter (Signed)
That last note should have been routed to you, sorry

## 2011-08-31 NOTE — Progress Notes (Deleted)
Patient ID: Barbara Odom, female   DOB: 1950/07/20, 61 y.o.   MRN: [id number] 2nd Follow up visit for  recent prolonged episode of bronchitis.  She was initially treated with doxycycline for 7 days on April 26, after self treating with azithromycin and steroid taper with failure to resolve.   CXR was normal except for hyperinflation.  She saw Dr. Dan Humphreys 2 weeks ago for persistent productive cough.  Dr. Dan Humphreys continued  the doxycycline and inhaled bronchodilator/steroids and added a prednisone taper   Her wheezing and cough have resolved.  Her right ear however has had a feeling of fullness with occasional pain.  She denies fevers,vertigo, wollen LN, sore throat and malaise.

## 2011-08-31 NOTE — Telephone Encounter (Signed)
Refills authorized and e prescribed.  I think she needs to see ENT n,  Not another urgent Care,  Because her right ear has been bothering her for over a month now.  i will put the referral in EPic

## 2011-09-02 NOTE — Telephone Encounter (Signed)
Patient has been notified. ENT appt has already been scheduled.

## 2011-09-12 ENCOUNTER — Encounter: Payer: Self-pay | Admitting: Internal Medicine

## 2011-12-01 ENCOUNTER — Other Ambulatory Visit: Payer: Self-pay | Admitting: Internal Medicine

## 2011-12-01 DIAGNOSIS — K5792 Diverticulitis of intestine, part unspecified, without perforation or abscess without bleeding: Secondary | ICD-10-CM | POA: Insufficient documentation

## 2011-12-01 MED ORDER — METRONIDAZOLE 500 MG PO TABS: 500.0000 mg | ORAL_TABLET | Freq: Three times a day (TID) | ORAL | Status: AC

## 2011-12-01 MED ORDER — CIPROFLOXACIN HCL 500 MG PO TABS: 500.0000 mg | ORAL_TABLET | Freq: Two times a day (BID) | ORAL | Status: DC

## 2011-12-01 NOTE — Assessment & Plan Note (Addendum)
Cipro/flagyl and clear liquid diet recommended for patient who is having RLQ pain and diarrhea with history of diverticulosis.  Follow up apt to ensure resolution recommended.

## 2011-12-07 ENCOUNTER — Encounter: Payer: Self-pay | Admitting: Internal Medicine

## 2011-12-07 ENCOUNTER — Ambulatory Visit (INDEPENDENT_AMBULATORY_CARE_PROVIDER_SITE_OTHER): Payer: BC Managed Care – PPO | Admitting: Internal Medicine

## 2011-12-07 VITALS — BP 140/74 | HR 78 | Temp 98.3°F | Resp 16 | Wt 133.2 lb

## 2011-12-07 DIAGNOSIS — K529 Noninfective gastroenteritis and colitis, unspecified: Secondary | ICD-10-CM

## 2011-12-07 DIAGNOSIS — R1013 Epigastric pain: Secondary | ICD-10-CM

## 2011-12-07 DIAGNOSIS — R197 Diarrhea, unspecified: Secondary | ICD-10-CM

## 2011-12-07 DIAGNOSIS — K2990 Gastroduodenitis, unspecified, without bleeding: Secondary | ICD-10-CM

## 2011-12-07 DIAGNOSIS — K2970 Gastritis, unspecified, without bleeding: Secondary | ICD-10-CM

## 2011-12-07 NOTE — Patient Instructions (Addendum)
Continue the flagyl  But stop the cipro.  Substitute dexilant for omeprazole for the gastritis  We are sending your stool for cultures and C dificile testing

## 2011-12-07 NOTE — Progress Notes (Signed)
Patient ID: Barbara Odom, female   DOB: 27-Aug-1950, 61 y.o.   MRN: [id number]  Patient Active Problem List  Diagnosis  . ALLERGIC RHINITIS  . DIVERTICULOSIS, COLON  . DISC DISEASE, CERVICAL  . OSTEOPENIA  . Screening for colon cancer  . Screening for cervical cancer  . Seizure disorder  . Migraine headache  . History of diverticulitis of colon  . Lumbago without sciatica  . Bronchitis  . Eustachian tube dysfunction, right  . Diverticulitis  . Colitis - presumed infectious origin    Subjective:  CC:   Chief Complaint  Patient presents with  . Abdominal Pain  . Diarrhea    HPI:   Barbara Odom a 61 y.o. female who presents Diarrhea x 5 days.  Patient developed LLQ pain accompanied by increased flatulence 10 days ago with constipation.  symtoms were similar to last attack of diverticulitis.  She self started cipro/flagyl88 days ago and changed diet to clear liquids and has now been on both antibioitics since then .  She has had watery  diarrhea for the last 5 days.  Having 10 small nonbloody runny stools daily , green, but  not foul smelling . No fevers or nausea, but has intermittent  Relived by stooling, aggravated by eating.  No nausea but having some  epigastric pain and started taking a PPI. Denies excessive alcohol use,  None since staring flagyl,  And no history of NSAID use,    Past Medical History  Diagnosis Date  . 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 seasonal rhinitis   . Migraine headache   . History of diverticulitis of colon   . Osteopenia     Past Surgical History  Procedure Date  . Carpal tunnel release 1983    bilateral   . Breast surgery     lumpectomy, benign   . Cervical discectomy August 2012    [place]         The following portions of the patient's history were reviewed and updated as appropriate: Allergies, current medications, and problem list.    Review of Systems:   12 Pt  review of systems was  negative except those addressed in the HPI,     History   Social History  . Marital Status: Married    Spouse Name: N/A    Number of Children: N/A  . Years of Education: N/A   Occupational History  . Not on file.   Social History Main Topics  . Smoking status: Never Smoker   . Smokeless tobacco: Never Used  . Alcohol Use: Yes  . Drug Use: No  . Sexually Active: Not on file   Other Topics Concern  . Not on file   Social History Narrative  . No narrative on file    Objective:  BP 140/74  Pulse 78  Temp 98.3 F (36.8 C) (Oral)  Resp 16  Wt 133 lb 4 oz (60.442 kg)  SpO2 97%  General appearance: alert, cooperative and appears stated age.  Nontoxic appearing,  Well hydrated Neck: no adenopathy, no carotid bruit, supple, symmetrical, trachea midline and thyroid not enlarged, symmetric, no tenderness/mass/nodules Back: symmetric, no curvature. ROM normal. No CVA tenderness. Lungs: clear to auscultation bilaterally Heart: regular rate and rhythm, S1, S2 normal, no murmur, click, rub or gallop Abdomen: soft, tender in mid epigastric region and LLQ without guarding; bowel sounds normal; no masses,  no organomegaly Pulses: 2+ and symmetric Skin: Skin color, texture,  turgor normal. No rashes or lesions Rectal: nontender, no blood,  No stool in vault. External hemorrhoid, noninflamed.  Lymph nodes: Cervical, supraclavicular, and axillary nodes normal.  Assessment and Plan:  Colitis - presumed infectious origin Unclear whether current symptoms are due to diverticulitis or community acquired infectious colitis vs C dicile colitis.  Patient works in hospital.  Stool studies/cultures ordered .  Patient has been on clear liquids for over 5 days with no improvement.  Trial of bland diet requested.   Gastritis Fecal testing was attempted but there was no stool in the vault.  Continue PPI   Updated Medication List Outpatient Encounter Prescriptions as of 12/07/2011  Medication  Sig Dispense Refill  . b complex vitamins tablet Take 1 tablet by mouth daily.        Marland Kitchen BLACK COHOSH PO Take by mouth.        . budesonide-formoterol (SYMBICORT) 160-4.5 MCG/ACT inhaler Inhale 2 puffs into the lungs 2 (two) times daily.  1 Inhaler  1  . cetirizine (ZYRTEC) 10 MG tablet Take 10 mg by mouth daily.        . Cholecalciferol (VITAMIN D3) 1000 UNITS CAPS Take by mouth.      . ciclesonide (OMNARIS) 50 MCG/ACT nasal spray Place 2 sprays into both nostrils daily.  12.5 g  0  . ciclesonide (OMNARIS) 50 MCG/ACT nasal spray Place 2 sprays into both nostrils daily.  12.5 g  0  . ciprofloxacin (CIPRO) 500 MG tablet Take 1 tablet (500 mg total) by mouth 2 (two) times daily.  20 tablet  0  . citalopram (CELEXA) 10 MG tablet Take 10 mg by mouth daily.        . hyoscyamine (LEVSIN SL) 0.125 MG SL tablet Place 1 tablet (0.125 mg total) under the tongue daily as needed.  90 tablet  2  . Multiple Vitamin (MULTIVITAMIN) tablet Take 1 tablet by mouth daily.        Marland Kitchen PHENObarbital (LUMINAL) 60 MG tablet Take 1 tablet (60 mg total) by mouth daily.  90 tablet  3  . rizatriptan (MAXALT) 10 MG tablet Take 1 tablet (10 mg total) by mouth once as needed for migraine. May repeat in 2 hours if needed  30 tablet  2  . zolpidem (AMBIEN) 5 MG tablet Take 1 tablet (5 mg total) by mouth at bedtime as needed.  30 tablet  1  . metroNIDAZOLE (FLAGYL) 500 MG tablet Take 1 tablet (500 mg total) by mouth 3 (three) times daily.  30 tablet  0

## 2011-12-09 ENCOUNTER — Encounter: Payer: Self-pay | Admitting: Internal Medicine

## 2011-12-09 DIAGNOSIS — K2970 Gastritis, unspecified, without bleeding: Secondary | ICD-10-CM | POA: Insufficient documentation

## 2011-12-09 DIAGNOSIS — K529 Noninfective gastroenteritis and colitis, unspecified: Secondary | ICD-10-CM | POA: Insufficient documentation

## 2011-12-09 LAB — CLOSTRIDIUM DIFFICILE EIA: CDIFTX: NEGATIVE

## 2011-12-09 NOTE — Assessment & Plan Note (Signed)
Unclear whether current symptoms are due to diverticulitis or community acquired infectious colitis vs C dicile colitis.  Patient works in hospital.  Stool studies/cultures ordered .  Patient has been on clear liquids for over 5 days with no improvement.  Trial of bland diet requested.

## 2011-12-09 NOTE — Assessment & Plan Note (Signed)
Fecal testing was attempted but there was no stool in the vault.  Continue PPI

## 2011-12-28 ENCOUNTER — Other Ambulatory Visit: Payer: Self-pay | Admitting: Internal Medicine

## 2011-12-30 ENCOUNTER — Other Ambulatory Visit: Payer: Self-pay

## 2011-12-30 DIAGNOSIS — J40 Bronchitis, not specified as acute or chronic: Secondary | ICD-10-CM

## 2011-12-30 MED ORDER — CICLESONIDE 50 MCG/ACT NA SUSP: 2.0000 | Freq: Every day | NASAL | Status: DC

## 2011-12-30 NOTE — Telephone Encounter (Signed)
Refill request for omnaris 50 MCG/ ACT nasal spray. Ok to refill?

## 2011-12-30 NOTE — Telephone Encounter (Signed)
Refill sent.

## 2012-02-02 ENCOUNTER — Other Ambulatory Visit: Payer: Self-pay

## 2012-02-02 DIAGNOSIS — G4700 Insomnia, unspecified: Secondary | ICD-10-CM

## 2012-02-09 ENCOUNTER — Other Ambulatory Visit: Payer: Self-pay | Admitting: Internal Medicine

## 2012-02-10 NOTE — Telephone Encounter (Signed)
Refilled script for Zofran per epic

## 2012-02-13 ENCOUNTER — Other Ambulatory Visit: Payer: Self-pay

## 2012-02-13 NOTE — Telephone Encounter (Signed)
Entered in error

## 2012-03-06 ENCOUNTER — Other Ambulatory Visit: Payer: Self-pay | Admitting: Internal Medicine

## 2012-03-06 NOTE — Telephone Encounter (Addendum)
Drug Name- Rizatriptan 10 mg tab Directions- Take 1 10 mg by mouth once as needed for migraine . May repeat in 2 hours if needed Quantity- 90 day

## 2012-03-07 MED ORDER — RIZATRIPTAN BENZOATE 10 MG PO TABS: 10.0000 mg | ORAL_TABLET | Freq: Once | ORAL | Status: DC | PRN

## 2012-03-08 ENCOUNTER — Other Ambulatory Visit: Payer: Self-pay

## 2012-03-08 NOTE — Telephone Encounter (Signed)
maxalt 10 [facility]

## 2012-03-09 ENCOUNTER — Other Ambulatory Visit: Payer: Self-pay | Admitting: Internal Medicine

## 2012-03-09 MED ORDER — PREDNISONE (PAK) 10 MG PO TABS: ORAL_TABLET | ORAL | Status: DC

## 2012-03-17 ENCOUNTER — Other Ambulatory Visit: Payer: Self-pay | Admitting: Internal Medicine

## 2012-03-17 MED ORDER — PHENOBARBITAL 64.8 MG PO TABS: 64.8000 mg | ORAL_TABLET | Freq: Two times a day (BID) | ORAL | Status: DC

## 2012-05-07 ENCOUNTER — Other Ambulatory Visit: Payer: Self-pay | Admitting: Internal Medicine

## 2012-05-07 DIAGNOSIS — J40 Bronchitis, not specified as acute or chronic: Secondary | ICD-10-CM

## 2012-05-07 MED ORDER — PREDNISONE (PAK) 10 MG PO TABS: ORAL_TABLET | ORAL | Status: DC

## 2012-05-07 MED ORDER — LEVOFLOXACIN 500 MG PO TABS: 500.0000 mg | ORAL_TABLET | Freq: Every day | ORAL | Status: DC

## 2012-05-07 NOTE — Assessment & Plan Note (Signed)
With mild otitis,  Started as viral syndrome. Progression despite use of otc meds and symbicort. Levaquin. Sent to pharmacy.

## 2012-05-25 ENCOUNTER — Ambulatory Visit: Payer: BC Managed Care – PPO | Admitting: Internal Medicine

## 2012-06-28 ENCOUNTER — Other Ambulatory Visit: Payer: Self-pay | Admitting: Internal Medicine

## 2012-06-28 ENCOUNTER — Telehealth: Payer: Self-pay | Admitting: *Deleted

## 2012-06-28 MED ORDER — CIPROFLOXACIN HCL 500 MG PO TABS: 500.0000 mg | ORAL_TABLET | Freq: Two times a day (BID) | ORAL | Status: DC

## 2012-06-28 NOTE — Telephone Encounter (Signed)
Refill Request  Ciprofloxacin 500 mg tab  #20 Take one tablet by mouth twice a daily

## 2012-06-29 NOTE — Telephone Encounter (Signed)
Med filled.  

## 2012-08-09 ENCOUNTER — Encounter: Payer: Self-pay | Admitting: Adult Health

## 2012-08-09 ENCOUNTER — Ambulatory Visit (INDEPENDENT_AMBULATORY_CARE_PROVIDER_SITE_OTHER): Payer: BC Managed Care – PPO | Admitting: Adult Health

## 2012-08-09 VITALS — BP 110/62 | HR 75 | Resp 12 | Wt 141.5 lb

## 2012-08-09 DIAGNOSIS — M545 Low back pain, unspecified: Secondary | ICD-10-CM

## 2012-08-09 MED ORDER — HYDROCODONE-ACETAMINOPHEN 5-325 MG PO TABS: 1.0000 | ORAL_TABLET | Freq: Four times a day (QID) | ORAL | Status: DC | PRN

## 2012-08-09 MED ORDER — PREDNISONE 10 MG PO TABS: 10.0000 mg | ORAL_TABLET | Freq: Every day | ORAL | Status: DC

## 2012-08-09 MED ORDER — DIAZEPAM 5 MG PO TABS: 5.0000 mg | ORAL_TABLET | Freq: Every day | ORAL | Status: DC | PRN

## 2012-08-09 NOTE — Patient Instructions (Addendum)
Prednisone taper:   Take 60 mg on the first day (6 tablets) then taper by 1/2 tablet daily until done:   6.0  5.5  5.0  4.5  4.0  3.5  3.0  2.5  2.0  1.5  1.0  0.5

## 2012-08-09 NOTE — Assessment & Plan Note (Signed)
Prednisone taper - 60 mg taper by 5 mg daily until done. Continue TENS unit and stretching as tolerated. Norco q6 hours as needed for pain.

## 2012-08-09 NOTE — Progress Notes (Signed)
Subjective:    Patient ID: Barbara Odom, female    DOB: 08/22/1950, 62 y.o.   MRN: [id number]  HPI  Patient is a 62 y/o female who presents to clinic with c/o low back pain. 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 Patient has been very proactive with conservative management and has been doing very well. She has opted to not have surgery. She exercises to keep her abdominal muscles strong, she stretches and does yoga. She also has a TENS unit at home which she uses when needed.   On April 26 she hurt her back lifting chair while working in her yard. She took a steroid dose pack that she had left over from 2 years ago given to her for poison ivy. She has been using her TENS unit and taking motrin. Her symptoms have slightly improved but she is still in considerable amount of pain. No radiculopathy.   Current Outpatient Prescriptions on File Prior to Visit  Medication Sig Dispense Refill  . b complex vitamins tablet Take 1 tablet by mouth daily.        Marland Kitchen BLACK COHOSH PO Take by mouth.        . budesonide-formoterol (SYMBICORT) 160-4.5 MCG/ACT inhaler Inhale 2 puffs into the lungs 2 (two) times daily.  1 Inhaler  1  . cetirizine (ZYRTEC) 10 MG tablet Take 10 mg by mouth daily.        . Cholecalciferol (VITAMIN D3) 1000 UNITS CAPS Take by mouth.      . citalopram (CELEXA) 10 MG tablet Take 10 mg by mouth daily.        . Multiple Vitamin (MULTIVITAMIN) tablet Take 1 tablet by mouth daily.        Marland Kitchen PHENObarbital (LUMINAL) 60 MG tablet Take 1 tablet (60 mg total) by mouth daily.  90 tablet  3  . hyoscyamine (LEVSIN SL) 0.125 MG SL tablet Place 1 tablet (0.125 mg total) under the tongue daily as needed.  90 tablet  2  . ondansetron (ZOFRAN-ODT) 8 MG disintegrating tablet TAKE 1 TABLET EVERY 6 HOURS AS NEEDED FOR NAUSEA AND VOMITING  30 tablet  2  . rizatriptan (MAXALT) 10 MG tablet Take 1 tablet (10 mg total) by mouth once as  needed for migraine. May repeat in 2 hours if needed  90 tablet  2  . zolpidem (AMBIEN) 5 MG tablet Take 1 tablet (5 mg total) by mouth at bedtime as needed.  30 tablet  1   No current facility-administered medications on file prior to visit.     Review of Systems  Constitutional: Negative.   Respiratory: Negative.   Cardiovascular: Negative.   Gastrointestinal: Negative.   Musculoskeletal: Positive for back pain.  Psychiatric/Behavioral: Negative.     BP 110/62  Pulse 75  Resp 12  Wt 141 lb 8 oz (64.184 kg)  BMI 22.5 kg/m2  SpO2 98%    Objective:   Physical Exam  Constitutional: She is oriented to person, place, and time. She appears well-developed and well-nourished. No distress.  Cardiovascular: Normal rate and regular rhythm.   Pulmonary/Chest: Effort normal. No respiratory distress.  Musculoskeletal:  Guarded gait noted when she first raises to a standing position.  Neurological: She is alert and oriented to person, place, and time.  Skin: Skin is warm and dry.  Psychiatric: She has a normal mood and affect. Her behavior is normal. Judgment and thought content normal.  Assessment & Plan:

## 2012-08-14 ENCOUNTER — Other Ambulatory Visit: Payer: Self-pay | Admitting: *Deleted

## 2012-08-21 ENCOUNTER — Other Ambulatory Visit: Payer: Self-pay | Admitting: *Deleted

## 2012-08-21 NOTE — Telephone Encounter (Signed)
Please Advise

## 2012-08-22 ENCOUNTER — Telehealth: Payer: Self-pay | Admitting: *Deleted

## 2012-08-22 MED ORDER — PHENOBARBITAL 60 MG PO TABS: 60.0000 mg | ORAL_TABLET | Freq: Every day | ORAL | Status: DC

## 2012-08-22 NOTE — Telephone Encounter (Signed)
Ok to refill,  Refill sent  

## 2012-08-22 NOTE — Telephone Encounter (Signed)
Last OV with you 9/13 saw Barbara Rey NP on 08/09/12 for acute visit please advise as to fill on phenobarbital 60 mg. Send to long term pharmacy. [facility].

## 2012-08-22 NOTE — Telephone Encounter (Signed)
Script faxed, patient notified

## 2012-09-13 ENCOUNTER — Ambulatory Visit: Payer: Self-pay | Admitting: Internal Medicine

## 2012-10-07 ENCOUNTER — Other Ambulatory Visit: Payer: Self-pay | Admitting: Internal Medicine

## 2012-10-16 ENCOUNTER — Encounter: Payer: Self-pay | Admitting: Internal Medicine

## 2012-11-20 ENCOUNTER — Other Ambulatory Visit: Payer: Self-pay | Admitting: *Deleted

## 2012-11-20 DIAGNOSIS — G4700 Insomnia, unspecified: Secondary | ICD-10-CM

## 2012-11-20 MED ORDER — ZOLPIDEM TARTRATE 5 MG PO TABS: 5.0000 mg | ORAL_TABLET | Freq: Every evening | ORAL | Status: DC | PRN

## 2012-11-21 LAB — HM MAMMOGRAPHY: HM Mammogram: NORMAL

## 2012-12-07 MED ORDER — ZOLPIDEM TARTRATE 5 MG PO TABS: 5.0000 mg | ORAL_TABLET | Freq: Every evening | ORAL | Status: DC | PRN

## 2012-12-07 NOTE — Telephone Encounter (Signed)
Have printed  New script for patient to fax to pharmacy as soon as signed.

## 2012-12-07 NOTE — Addendum Note (Signed)
Addended by: Dennie Bible on: 12/07/2012 11:51 AM   Modules accepted: Orders

## 2012-12-12 ENCOUNTER — Other Ambulatory Visit: Payer: Self-pay | Admitting: Internal Medicine

## 2013-02-20 ENCOUNTER — Ambulatory Visit (INDEPENDENT_AMBULATORY_CARE_PROVIDER_SITE_OTHER): Payer: BC Managed Care – PPO | Admitting: Internal Medicine

## 2013-02-20 ENCOUNTER — Encounter: Payer: Self-pay | Admitting: Internal Medicine

## 2013-02-20 VITALS — BP 120/64 | HR 86 | Temp 98.8°F | Resp 12 | Ht 66.5 in | Wt 140.2 lb

## 2013-02-20 DIAGNOSIS — M545 Low back pain, unspecified: Secondary | ICD-10-CM

## 2013-02-20 DIAGNOSIS — G4700 Insomnia, unspecified: Secondary | ICD-10-CM

## 2013-02-20 DIAGNOSIS — N390 Urinary tract infection, site not specified: Secondary | ICD-10-CM

## 2013-02-20 DIAGNOSIS — M5030 Other cervical disc degeneration, unspecified cervical region: Secondary | ICD-10-CM

## 2013-02-20 DIAGNOSIS — H6980 Other specified disorders of Eustachian tube, unspecified ear: Secondary | ICD-10-CM

## 2013-02-20 DIAGNOSIS — H6981 Other specified disorders of Eustachian tube, right ear: Secondary | ICD-10-CM

## 2013-02-20 DIAGNOSIS — N3010 Interstitial cystitis (chronic) without hematuria: Secondary | ICD-10-CM

## 2013-02-20 LAB — POCT URINALYSIS DIPSTICK
Bilirubin, UA: NEGATIVE
Leukocytes, UA: NEGATIVE
Nitrite, UA: POSITIVE
pH, UA: 6

## 2013-02-20 MED ORDER — CIPROFLOXACIN HCL 500 MG PO TABS: 500.0000 mg | ORAL_TABLET | Freq: Two times a day (BID) | ORAL | Status: DC

## 2013-02-20 MED ORDER — METRONIDAZOLE 500 MG PO TABS: 500.0000 mg | ORAL_TABLET | Freq: Three times a day (TID) | ORAL | Status: DC

## 2013-02-20 MED ORDER — ZOLPIDEM TARTRATE 10 MG PO TABS: 5.0000 mg | ORAL_TABLET | Freq: Every evening | ORAL | Status: DC | PRN

## 2013-02-20 MED ORDER — CYANOCOBALAMIN 1000 MCG/ML IJ SOLN: 1000.0000 ug | Freq: Once | INTRAMUSCULAR | Status: DC

## 2013-02-20 MED ORDER — PHENOBARBITAL 60 MG PO TABS: 60.0000 mg | ORAL_TABLET | Freq: Every day | ORAL | Status: DC

## 2013-02-20 MED ORDER — SYRINGE (DISPOSABLE) 1 ML MISC: Status: DC

## 2013-02-20 MED ORDER — UROGESIC-BLUE 81.6 MG PO TABS: 1.0000 | ORAL_TABLET | Freq: Three times a day (TID) | ORAL | Status: DC

## 2013-02-20 NOTE — Progress Notes (Signed)
Patient ID: Barbara Odom, female   DOB: 03/06/1951, 62 y.o.   MRN: [id number]   Patient Active Problem List   Diagnosis Date Noted  . Cystitis, interstitial 02/21/2013  . Eustachian tube dysfunction, right 08/07/2011  . Lumbago without sciatica 02/26/2011  . Seizure disorder   . Migraine headache   . History of diverticulitis of colon   . Screening for colon cancer 02/23/2011  . Screening for cervical cancer 02/23/2011  . DISC DISEASE, CERVICAL 03/02/2007  . OSTEOPENIA 03/02/2007  . ALLERGIC RHINITIS 03/01/2007  . DIVERTICULOSIS, COLON 03/01/2007    Subjective:  CC:   Chief Complaint  Patient presents with  . Follow-up    med refill / had blood in urine on november 5   . Urinary Tract Infection    cipro on the 6 started on macrodantin 11/11 x 10 days    HPI:   Barbara Odom a 62 y.o. female who presents for follow up on chronic and acute problems. She has been having altered sensation in the right ear,    Hears a rustling sound,  Nor recent flights or siniusitis but is flying to  [place] in 3 weeks for her annual winter vacation.   Has been having Bilateral hip pain , r > L worse in the AM which improves with stretching and activity  Abut is aggravated by  Daily walking > 3 -4 miles in a row daily  She is up to date on GYN exams.  Saw OB GYN on nov 5th UA showed blood and ketones. .  Treated with cipro x 5 days then changed to macrodantin on nov 11th for 7 days .  Back on nov 21st. With recurrent bladder spasms. Burning sensation in suprapubic area.         Past Medical History  Diagnosis Date  . 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 seasonal rhinitis   . Migraine headache   . History of diverticulitis of colon   . Osteopenia     Past Surgical History  Procedure Laterality Date  . Carpal tunnel release  1983    bilateral   . Breast surgery      lumpectomy, benign   . Cervical discectomy  August 2012    [place]       The  following portions of the patient's history were reviewed and updated as appropriate: Allergies, current medications, and problem list.    Review of Systems:   12 Pt  review of systems was negative except those addressed in the HPI,     History   Social History  . Marital Status: Married    Spouse Name: N/A    Number of Children: N/A  . Years of Education: N/A   Occupational History  . Not on file.   Social History Main Topics  . Smoking status: Never Smoker   . Smokeless tobacco: Never Used  . Alcohol Use: Yes  . Drug Use: No  . Sexual Activity: Yes   Other Topics Concern  . Not on file   Social History Narrative  . No narrative on file    Objective:  Filed Vitals:   02/20/13 1455  BP: 120/64  Pulse: 86  Temp: 98.8 F (37.1 C)  Resp: 12     General appearance: alert, cooperative and appears stated age Ears: right stapes enflamed without bulging or effusion.  normal left TMs Throat: lips, mucosa, and tongue normal; teeth and gums normal Neck: no  adenopathy, no carotid bruit, supple, symmetrical, trachea midline and thyroid not enlarged, symmetric, no tenderness/mass/nodules Back: symmetric, no curvature. ROM normal. No CVA tenderness. Lungs: clear to auscultation bilaterally Heart: regular rate and rhythm, S1, S2 normal, no murmur, click, rub or gallop Abdomen: soft, non-tender; bowel sounds normal; no masses,  no organomegaly Pulses: 2+ and symmetric Skin: Skin color, texture, turgor normal. No rashes or lesions Lymph nodes: Cervical, supraclavicular, and axillary nodes normal.  Assessment and Plan:  Lumbago without sciatica discusse current symptoms which are minimal and aggravated by rest , supine position and yard work  Eustachian tube dysfunction, right Recurrent,  Recommend using sudafed PE and prednisone tapers when necessary,  avoiding abx   Cystitis, interstitial Suspected by history .  Repeat urine and culture today   DISC DISEASE,  CERVICAL S/p successful surgery Summer 2012 by Eliane Decree.      Updated Medication List Outpatient Encounter Prescriptions as of 02/20/2013  Medication Sig  . BLACK COHOSH PO Take by mouth.    . budesonide-formoterol (SYMBICORT) 160-4.5 MCG/ACT inhaler Inhale 2 puffs into the lungs 2 (two) times daily. PLEASE CALL FOR APPT FOR FUTHER REFILLS  . cetirizine (ZYRTEC) 10 MG tablet Take 10 mg by mouth daily.    . Cholecalciferol (VITAMIN D3) 1000 UNITS CAPS Take by mouth.  . citalopram (CELEXA) 10 MG tablet Take 10 mg by mouth daily.    . hyoscyamine (LEVSIN SL) 0.125 MG SL tablet Place 1 tablet (0.125 mg total) under the tongue daily as needed.  . Multiple Vitamin (MULTIVITAMIN) tablet Take 1 tablet by mouth daily.    Marland Kitchen omeprazole (PRILOSEC) 40 MG capsule Take 1 capsule by mouth daily.  . ondansetron (ZOFRAN-ODT) 8 MG disintegrating tablet TAKE 1 TABLET EVERY 6 HOURS AS NEEDED FOR NAUSEA AND VOMITING  . PHENObarbital (LUMINAL) 60 MG tablet Take 1 tablet (60 mg total) by mouth daily.  . rizatriptan (MAXALT) 10 MG tablet Take 1 tablet (10 mg total) by mouth once as needed for migraine. May repeat in 2 hours if needed  . tretinoin (RETIN-A) 0.025 % cream Apply 1 application topically as needed.  . zolpidem (AMBIEN) 10 MG tablet Take 0.5 tablets (5 mg total) by mouth at bedtime as needed.  . [DISCONTINUED] PHENObarbital (LUMINAL) 60 MG tablet Take 1 tablet (60 mg total) by mouth daily.  . [DISCONTINUED] zolpidem (AMBIEN) 5 MG tablet Take 1 tablet (5 mg total) by mouth at bedtime as needed.  Marland Kitchen b complex vitamins tablet Take 1 tablet by mouth daily.    . ciprofloxacin (CIPRO) 500 MG tablet Take 1 tablet (500 mg total) by mouth 2 (two) times daily.  . cyanocobalamin (,VITAMIN B-12,) 1000 MCG/ML injection Inject 1 mL (1,000 mcg total) into the muscle once. I ml IM injection monthly  . diazepam (VALIUM) 5 MG tablet Take 1 tablet (5 mg total) by mouth daily as needed for anxiety.  Marland Kitchen  HYDROcodone-acetaminophen (NORCO/VICODIN) 5-325 MG per tablet Take 1 tablet by mouth every 6 (six) hours as needed for pain.  . Methen-Hyosc-Meth Blue-Na Phos (UROGESIC-BLUE) 81.6 MG TABS Take 1 tablet (81.6 mg total) by mouth 3 (three) times daily.  . metroNIDAZOLE (FLAGYL) 500 MG tablet Take 1 tablet (500 mg total) by mouth 3 (three) times daily.  . predniSONE (DELTASONE) 10 MG tablet Take 1 tablet (10 mg total) by mouth daily.  . Syringe, Disposable, 1 ML MISC For user with B12 injections     Orders Placed This Encounter  Procedures  . Urine Culture  .  HM MAMMOGRAPHY  . Urine Microscopic Only  . HM PAP SMEAR  . POCT urinalysis dipstick    No Follow-up on file.

## 2013-02-21 ENCOUNTER — Encounter: Payer: Self-pay | Admitting: Internal Medicine

## 2013-02-21 DIAGNOSIS — N3010 Interstitial cystitis (chronic) without hematuria: Secondary | ICD-10-CM | POA: Insufficient documentation

## 2013-02-21 LAB — URINE CULTURE
Colony Count: NO GROWTH
Organism ID, Bacteria: NO GROWTH

## 2013-02-21 LAB — URINALYSIS, MICROSCOPIC ONLY

## 2013-02-21 NOTE — Assessment & Plan Note (Signed)
Suspected by history .  Repeat urine and culture today

## 2013-02-21 NOTE — Assessment & Plan Note (Signed)
discusse current symptoms which are minimal and aggravated by rest , supine position and yard work

## 2013-02-21 NOTE — Assessment & Plan Note (Signed)
S/p successful surgery Summer 2012 by Eliane Decree.

## 2013-02-21 NOTE — Assessment & Plan Note (Signed)
Recurrent,  Recommend using sudafed PE and prednisone tapers when necessary,  avoiding abx

## 2013-02-25 ENCOUNTER — Telehealth: Payer: Self-pay | Admitting: Internal Medicine

## 2013-02-25 NOTE — Telephone Encounter (Signed)
Pt left vm.  States she had appt Wednesday, nurse called her Friday.  She is returning call.  ?UA results.

## 2013-02-26 NOTE — Telephone Encounter (Signed)
Left message for to return call to office for lab results.

## 2013-02-27 NOTE — Telephone Encounter (Signed)
Reached patient and she will make an appointment to see urology.

## 2013-05-01 ENCOUNTER — Telehealth: Payer: Self-pay | Admitting: Internal Medicine

## 2013-05-01 NOTE — Telephone Encounter (Signed)
The patient is wanting to be seen by Dr. Derrel Nip on Friday 2.13.15  to follow up from going to employee health at [facility] for her bronchitis and pneumonia. Please advise.

## 2013-05-01 NOTE — Telephone Encounter (Signed)
Patient scheduled for 430 Friday.

## 2013-05-02 ENCOUNTER — Other Ambulatory Visit: Payer: Self-pay | Admitting: *Deleted

## 2013-05-02 MED ORDER — BUDESONIDE-FORMOTEROL FUMARATE 160-4.5 MCG/ACT IN AERO: 2.0000 | INHALATION_SPRAY | Freq: Two times a day (BID) | RESPIRATORY_TRACT | Status: DC

## 2013-05-03 ENCOUNTER — Encounter: Payer: Self-pay | Admitting: Internal Medicine

## 2013-05-03 ENCOUNTER — Ambulatory Visit (INDEPENDENT_AMBULATORY_CARE_PROVIDER_SITE_OTHER): Payer: BC Managed Care – PPO | Admitting: Internal Medicine

## 2013-05-03 VITALS — BP 122/74 | HR 75 | Temp 98.6°F | Resp 16 | Wt 138.0 lb

## 2013-05-03 DIAGNOSIS — J45901 Unspecified asthma with (acute) exacerbation: Principal | ICD-10-CM

## 2013-05-03 DIAGNOSIS — J441 Chronic obstructive pulmonary disease with (acute) exacerbation: Secondary | ICD-10-CM

## 2013-05-03 MED ORDER — PREDNISONE 10 MG PO TABS: 10.0000 mg | ORAL_TABLET | Freq: Every day | ORAL | Status: DC

## 2013-05-03 MED ORDER — PREDNISONE 50 MG PO TABS: ORAL_TABLET | ORAL | Status: DC

## 2013-05-03 MED ORDER — PREDNISONE (PAK) 10 MG PO TABS: ORAL_TABLET | ORAL | Status: DC

## 2013-05-03 NOTE — Progress Notes (Signed)
Patient ID: Barbara Odom, female   DOB: February 03, 1951, 63 y.o.   MRN: [id number]  Patient Active Problem List   Diagnosis Date Noted  . Cystitis, interstitial 02/21/2013  . Eustachian tube dysfunction, right 08/07/2011  . Lumbago without sciatica 02/26/2011  . Seizure disorder   . Migraine headache   . History of diverticulitis of colon   . Screening for colon cancer 02/23/2011  . Screening for cervical cancer 02/23/2011  . [place] DISEASE, CERVICAL 03/02/2007  . OSTEOPENIA 03/02/2007  . ALLERGIC RHINITIS 03/01/2007  . DIVERTICULOSIS, COLON 03/01/2007    Subjective:  CC:   Chief Complaint  Patient presents with  . Acute Visit    1/14 omnicef bronchitits 04/10/13 levaquin 750 mg for 10 days.6 days of medrol    HPI:   Barbara Odom is a 63 y.o. female who presents for  Persistent wheezing since January 14.  Patient reports initial symptoms started after she returned from [place] in early January.  She was treated with  omnicef and steroid taper which she started on jan 14, On  Jan 2t0th had fevers to 22 F  And was evaluated on Jan 21 by Employee Health.  Flu test was negative on jan 21 and she was prescribed levaquin x 10 days and a medrol dose pack.  On Jan 28th she was treated with another dose pack and duo neb in the office, then feb 4th again steroid taper for 7 days  .    Past Medical History  Diagnosis Date  . 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 seasonal rhinitis   . Migraine headache   . History of diverticulitis of colon   . Osteopenia     Past Surgical History  Procedure Laterality Date  . Carpal tunnel release  1983    bilateral   . Breast surgery      lumpectomy, benign   . Cervical discectomy  August 2012    [place]       The following portions of the patient's history were reviewed and updated as appropriate: Allergies, current medications, and problem list.    Review of Systems:   Patient denies headache, fevers,  malaise, unintentional weight loss, skin rash, eye pain, sinus congestion and sinus pain, sore throat, dysphagia,  hemoptysis , cough, dyspnea, wheezing, chest pain, palpitations, orthopnea, edema, abdominal pain, nausea, melena, diarrhea, constipation, flank pain, dysuria, hematuria, urinary  Frequency, nocturia, numbness, tingling, seizures,  Focal weakness, Loss of consciousness,  Tremor, insomnia, depression, anxiety, and suicidal ideation.     History   Social History  . Marital Status: Married    Spouse Name: N/A    Number of Children: N/A  . Years of Education: N/A   Occupational History  . Not on file.   Social History Main Topics  . Smoking status: Never Smoker   . Smokeless tobacco: Never Used  . Alcohol Use: Yes  . Drug Use: No  . Sexual Activity: Yes   Other Topics Concern  . Not on file   Social History Narrative  . No narrative on file    Objective:  Filed Vitals:   05/03/13 1651  BP: 122/74  Pulse: 75  Temp: 98.6 F (37 C)  Resp: 16     General appearance: alert, cooperative and appears stated age Ears: normal TM's and external ear canals both ears Throat: lips, mucosa, and tongue normal; teeth and gums normal Neck: no adenopathy, no carotid bruit, supple, symmetrical, trachea midline  and thyroid not enlarged, symmetric, no tenderness/mass/nodules Back: symmetric, no curvature. ROM normal. No CVA tenderness. Lungs: clear to auscultation bilaterally Heart: regular rate and rhythm, S1, S2 normal, no murmur, click, rub or gallop Abdomen: soft, non-tender; bowel sounds normal; no masses,  no organomegaly Pulses: 2+ and symmetric Skin: Skin color, texture, turgor normal. No rashes or lesions Lymph nodes: Cervical, supraclavicular, and axillary nodes normal.  Assessment and Plan:  Asthma, chronic obstructive, with acute exacerbation Vs hypersensitivity pneumonitis or COP.  Patient has not had a chest x ray during this episoes but has had hyperinflated  lungs on films done prevsiouly .  Will prescribed inhaled steroids and p2 week steroid taper, if no improvement, CXR/CT and pulmonary consult needed.    Updated Medication List Outpatient Encounter Prescriptions as of 05/03/2013  Medication Sig  . BLACK COHOSH PO Take by mouth.    . budesonide-formoterol (SYMBICORT) 160-4.5 MCG/ACT inhaler Inhale 2 puffs into the lungs 2 (two) times daily.  . cetirizine (ZYRTEC) 10 MG tablet Take 10 mg by mouth daily.    . Cholecalciferol (VITAMIN D3) 1000 UNITS CAPS Take by mouth.  . citalopram (CELEXA) 10 MG tablet Take 10 mg by mouth daily.    . cyanocobalamin (,VITAMIN B-12,) 1000 MCG/ML injection Inject 1 mL (1,000 mcg total) into the muscle once. I ml IM injection monthly  . diazepam (VALIUM) 5 MG tablet Take 1 tablet (5 mg total) by mouth daily as needed for anxiety.  Marland Kitchen HYDROcodone-acetaminophen (NORCO/VICODIN) 5-325 MG per tablet Take 1 tablet by mouth every 6 (six) hours as needed for pain.  . hyoscyamine (LEVSIN SL) 0.125 MG SL tablet Place 1 tablet (0.125 mg total) under the tongue daily as needed.  Marland Kitchen ipratropium (ATROVENT HFA) 17 MCG/ACT inhaler Inhale 2 puffs into the lungs every 6 (six) hours.  . Methen-Hyosc-Meth Blue-Na Phos (UROGESIC-BLUE) 81.6 MG TABS Take 1 tablet (81.6 mg total) by mouth 3 (three) times daily.  . Multiple Vitamin (MULTIVITAMIN) tablet Take 1 tablet by mouth daily.    Marland Kitchen omeprazole (PRILOSEC) 40 MG capsule Take 1 capsule by mouth daily.  Marland Kitchen PHENObarbital (LUMINAL) 60 MG tablet Take 1 tablet (60 mg total) by mouth daily.  . predniSONE (DELTASONE) 10 MG tablet Take 1 tablet (10 mg total) by mouth daily.  . Syringe, Disposable, 1 ML MISC For user with B12 injections  . tretinoin (RETIN-A) 0.025 % cream Apply 1 application topically as needed.  . zolpidem (AMBIEN) 10 MG tablet Take 0.5 tablets (5 mg total) by mouth at bedtime as needed.  . [DISCONTINUED] predniSONE (DELTASONE) 10 MG tablet Take 1 tablet (10 mg total) by mouth  daily.  . [DISCONTINUED] predniSONE (DELTASONE) 10 MG tablet Take 1 tablet (10 mg total) by mouth daily.  Marland Kitchen b complex vitamins tablet Take 1 tablet by mouth daily.    . ciprofloxacin (CIPRO) 500 MG tablet Take 1 tablet (500 mg total) by mouth 2 (two) times daily.  . fluconazole (DIFLUCAN) 150 MG tablet   . HYDROcodone-homatropine (HYCODAN) 5-1.5 MG/5ML syrup   . levofloxacin (LEVAQUIN) 750 MG tablet   . metroNIDAZOLE (FLAGYL) 500 MG tablet Take 1 tablet (500 mg total) by mouth 3 (three) times daily.  . ondansetron (ZOFRAN-ODT) 8 MG disintegrating tablet TAKE 1 TABLET EVERY 6 HOURS AS NEEDED FOR NAUSEA AND VOMITING  . predniSONE (DELTASONE) 50 MG tablet 1 tablet daily for 7 days  . predniSONE (STERAPRED UNI-PAK) 10 MG tablet 6 tablets on Day 1 , then reduce by 1 tablet daily until  gone (start Week 2)  . rizatriptan (MAXALT) 10 MG tablet Take 1 tablet (10 mg total) by mouth once as needed for migraine. May repeat in 2 hours if needed

## 2013-05-05 ENCOUNTER — Encounter: Payer: Self-pay | Admitting: Internal Medicine

## 2013-05-05 NOTE — Assessment & Plan Note (Addendum)
Vs hypersensitivity pneumonitis or COP.  Patient has not had a chest x ray during this episoes but has had hyperinflated lungs on films done prevsiouly .  Will prescribed inhaled steroids and p2 week steroid taper, if no improvement, CXR/CT and pulmonary consult needed.

## 2013-05-29 ENCOUNTER — Ambulatory Visit: Payer: BC Managed Care – PPO | Admitting: Internal Medicine

## 2013-06-05 ENCOUNTER — Encounter (INDEPENDENT_AMBULATORY_CARE_PROVIDER_SITE_OTHER): Payer: Self-pay

## 2013-06-05 ENCOUNTER — Ambulatory Visit (INDEPENDENT_AMBULATORY_CARE_PROVIDER_SITE_OTHER): Payer: BC Managed Care – PPO | Admitting: Internal Medicine

## 2013-06-05 ENCOUNTER — Encounter: Payer: Self-pay | Admitting: Internal Medicine

## 2013-06-05 VITALS — BP 132/72 | HR 75 | Temp 98.6°F | Resp 16 | Wt 141.0 lb

## 2013-06-05 DIAGNOSIS — J440 Chronic obstructive pulmonary disease with acute lower respiratory infection: Secondary | ICD-10-CM

## 2013-06-05 MED ORDER — SOLIFENACIN SUCCINATE 5 MG PO TABS: 5.0000 mg | ORAL_TABLET | Freq: Every day | ORAL | Status: DC

## 2013-06-05 MED ORDER — RIZATRIPTAN BENZOATE 10 MG PO TABS: 10.0000 mg | ORAL_TABLET | Freq: Once | ORAL | Status: DC | PRN

## 2013-06-05 MED ORDER — HYOSCYAMINE SULFATE 0.125 MG SL SUBL: 0.1250 mg | SUBLINGUAL_TABLET | Freq: Every day | SUBLINGUAL | Status: DC | PRN

## 2013-06-05 NOTE — Progress Notes (Signed)
Pre-visit discussion using our clinic review tool. No additional management support is needed unless otherwise documented below in the visit note.  

## 2013-06-05 NOTE — Progress Notes (Signed)
Patient ID: Barbara Odom, female   DOB: September 20, 1950, 63 y.o.   MRN: [id number]  \ Patient Active Problem List   Diagnosis Date Noted  . Bronchitis, chronic obstructive w acute bronchitis 06/08/2013  . Cystitis, interstitial 02/21/2013  . Eustachian tube dysfunction, right 08/07/2011  . Lumbago without sciatica 02/26/2011  . Seizure disorder   . Migraine headache   . History of diverticulitis of colon   . Screening for colon cancer 02/23/2011  . Screening for cervical cancer 02/23/2011  . [place] DISEASE, CERVICAL 03/02/2007  . OSTEOPENIA 03/02/2007  . ALLERGIC RHINITIS 03/01/2007  . DIVERTICULOSIS, COLON 03/01/2007    Subjective:  CC:   Chief Complaint  Patient presents with  . Follow-up    HPI:   Barbara Odom is a 63 y.o. female who presents forFollow up on recent prolonged episode of bronchitis with bronchospasm and fevers requiring multiple rounds of antibioticx and prolonged prednisone . Her symptoms have finally resolved.    Follow up on hematuria.  She has had a Urology eval for hematuria.  No cystoscopy done.  KUB was negative  for stones. She is  Using vesicare prn .       Past Medical History  Diagnosis Date  . 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 seasonal rhinitis   . Migraine headache   . History of diverticulitis of colon   . Osteopenia     Past Surgical History  Procedure Laterality Date  . Carpal tunnel release  1983    bilateral   . Breast surgery      lumpectomy, benign   . Cervical discectomy  August 2012    [place]       The following portions of the patient's history were reviewed and updated as appropriate: Allergies, current medications, and problem list.    Review of Systems:   Patient denies headache, fevers, malaise, unintentional weight loss, skin rash, eye pain, sinus congestion and sinus pain, sore throat, dysphagia,  hemoptysis , cough, dyspnea, wheezing, chest pain, palpitations, orthopnea, edema,  abdominal pain, nausea, melena, diarrhea, constipation, flank pain, dysuria, hematuria, urinary  Frequency, nocturia, numbness, tingling, seizures,  Focal weakness, Loss of consciousness,  Tremor, insomnia, depression, anxiety, and suicidal ideation.     History   Social History  . Marital Status: Married    Spouse Name: N/A    Number of Children: N/A  . Years of Education: N/A   Occupational History  . Not on file.   Social History Main Topics  . Smoking status: Never Smoker   . Smokeless tobacco: Never Used  . Alcohol Use: Yes  . Drug Use: No  . Sexual Activity: Yes   Other Topics Concern  . Not on file   Social History Narrative  . No narrative on file    Objective:  Filed Vitals:   06/05/13 1624  BP: 132/72  Pulse: 75  Temp: 98.6 F (37 C)  Resp: 16     General appearance: alert, cooperative and appears stated age Ears: normal TM's and external ear canals both ears Throat: lips, mucosa, and tongue normal; teeth and gums normal Neck: no adenopathy, no carotid bruit, supple, symmetrical, trachea midline and thyroid not enlarged, symmetric, no tenderness/mass/nodules Back: symmetric, no curvature. ROM normal. No CVA tenderness. Lungs: clear to auscultation bilaterally Heart: regular rate and rhythm, S1, S2 normal, no murmur, click, rub or gallop Abdomen: soft, non-tender; bowel sounds normal; no masses,  no organomegaly Pulses: 2+ and  symmetric Skin: Skin color, texture, turgor normal. No rashes or lesions Lymph nodes: Cervical, supraclavicular, and axillary nodes normal.  Assessment and Plan:  Bronchitis, chronic obstructive w acute bronchitis Her symptoms have finally resolved ater a prolonged prednisone taper .  Discussed obtaining PFTS  To evaluate fron chronic airway disease but she wants to defer testing until after her daughter's wedding    Updated Medication List Outpatient Encounter Prescriptions as of 06/05/2013  Medication Sig  . BLACK COHOSH PO  Take by mouth.    . budesonide-formoterol (SYMBICORT) 160-4.5 MCG/ACT inhaler Inhale 2 puffs into the lungs 2 (two) times daily.  . cetirizine (ZYRTEC) 10 MG tablet Take 10 mg by mouth daily.    . Cholecalciferol (VITAMIN D3) 1000 UNITS CAPS Take by mouth.  . citalopram (CELEXA) 10 MG tablet Take 10 mg by mouth daily.    . cyanocobalamin (,VITAMIN B-12,) 1000 MCG/ML injection Inject 1 mL (1,000 mcg total) into the muscle once. I ml IM injection monthly  . diazepam (VALIUM) 5 MG tablet Take 1 tablet (5 mg total) by mouth daily as needed for anxiety.  Marland Kitchen HYDROcodone-acetaminophen (NORCO/VICODIN) 5-325 MG per tablet Take 1 tablet by mouth every 6 (six) hours as needed for pain.  . hyoscyamine (LEVSIN SL) 0.125 MG SL tablet Place 1 tablet (0.125 mg total) under the tongue daily as needed.  Marland Kitchen ipratropium (ATROVENT HFA) 17 MCG/ACT inhaler Inhale 2 puffs into the lungs every 6 (six) hours.  . Multiple Vitamin (MULTIVITAMIN) tablet Take 1 tablet by mouth daily.    Marland Kitchen omeprazole (PRILOSEC) 40 MG capsule Take 1 capsule by mouth daily.  . ondansetron (ZOFRAN-ODT) 8 MG disintegrating tablet TAKE 1 TABLET EVERY 6 HOURS AS NEEDED FOR NAUSEA AND VOMITING  . PHENObarbital (LUMINAL) 60 MG tablet Take 1 tablet (60 mg total) by mouth daily.  . Syringe, Disposable, 1 ML MISC For user with B12 injections  . tretinoin (RETIN-A) 0.025 % cream Apply 1 application topically as needed.  . zolpidem (AMBIEN) 10 MG tablet Take 0.5 tablets (5 mg total) by mouth at bedtime as needed.  . [DISCONTINUED] hyoscyamine (LEVSIN SL) 0.125 MG SL tablet Place 1 tablet (0.125 mg total) under the tongue daily as needed.  Marland Kitchen HYDROcodone-homatropine (HYCODAN) 5-1.5 MG/5ML syrup   . rizatriptan (MAXALT) 10 MG tablet Take 1 tablet (10 mg total) by mouth once as needed for migraine. May repeat in 2 hours if needed  . solifenacin (VESICARE) 5 MG tablet Take 1 tablet (5 mg total) by mouth daily.  . [DISCONTINUED] b complex vitamins tablet Take 1  tablet by mouth daily.    . [DISCONTINUED] ciprofloxacin (CIPRO) 500 MG tablet Take 1 tablet (500 mg total) by mouth 2 (two) times daily.  . [DISCONTINUED] fluconazole (DIFLUCAN) 150 MG tablet   . [DISCONTINUED] levofloxacin (LEVAQUIN) 750 MG tablet   . [DISCONTINUED] Methen-Hyosc-Meth Blue-Na Phos (UROGESIC-BLUE) 81.6 MG TABS Take 1 tablet (81.6 mg total) by mouth 3 (three) times daily.  . [DISCONTINUED] metroNIDAZOLE (FLAGYL) 500 MG tablet Take 1 tablet (500 mg total) by mouth 3 (three) times daily.  . [DISCONTINUED] predniSONE (DELTASONE) 10 MG tablet Take 1 tablet (10 mg total) by mouth daily.  . [DISCONTINUED] predniSONE (DELTASONE) 50 MG tablet 1 tablet daily for 7 days  . [DISCONTINUED] predniSONE (STERAPRED UNI-PAK) 10 MG tablet 6 tablets on Day 1 , then reduce by 1 tablet daily until gone (start Week 2)  . [DISCONTINUED] rizatriptan (MAXALT) 10 MG tablet Take 1 tablet (10 mg total) by mouth once  as needed for migraine. May repeat in 2 hours if needed     No orders of the defined types were placed in this encounter.    No Follow-up on file.

## 2013-06-08 ENCOUNTER — Encounter: Payer: Self-pay | Admitting: Internal Medicine

## 2013-06-08 DIAGNOSIS — J440 Chronic obstructive pulmonary disease with acute lower respiratory infection: Secondary | ICD-10-CM | POA: Insufficient documentation

## 2013-06-08 NOTE — Assessment & Plan Note (Signed)
Her symptoms have finally resolved ater a prolonged prednisone taper .  Discussed obtaining PFTS  To evaluate fron chronic airway disease but she wants to defer testing until after her daughter's wedding

## 2013-07-11 ENCOUNTER — Telehealth: Payer: Self-pay | Admitting: Internal Medicine

## 2013-07-11 MED ORDER — PREDNISONE (PAK) 10 MG PO TABS: ORAL_TABLET | ORAL | Status: DC

## 2013-09-09 ENCOUNTER — Other Ambulatory Visit: Payer: Self-pay | Admitting: *Deleted

## 2013-09-09 MED ORDER — PHENOBARBITAL 60 MG PO TABS: 60.0000 mg | ORAL_TABLET | Freq: Every day | ORAL | Status: DC

## 2013-09-23 ENCOUNTER — Encounter: Payer: Self-pay | Admitting: Internal Medicine

## 2013-09-23 ENCOUNTER — Ambulatory Visit (INDEPENDENT_AMBULATORY_CARE_PROVIDER_SITE_OTHER): Payer: BC Managed Care – PPO | Admitting: Internal Medicine

## 2013-09-23 VITALS — BP 144/78 | HR 85 | Temp 99.0°F | Resp 16 | Wt 138.5 lb

## 2013-09-23 DIAGNOSIS — R0789 Other chest pain: Secondary | ICD-10-CM

## 2013-09-23 DIAGNOSIS — Z1239 Encounter for other screening for malignant neoplasm of breast: Secondary | ICD-10-CM

## 2013-09-23 DIAGNOSIS — G4700 Insomnia, unspecified: Secondary | ICD-10-CM

## 2013-09-23 DIAGNOSIS — R079 Chest pain, unspecified: Secondary | ICD-10-CM

## 2013-09-23 MED ORDER — ZOLPIDEM TARTRATE 10 MG PO TABS: 5.0000 mg | ORAL_TABLET | Freq: Every evening | ORAL | Status: DC | PRN

## 2013-09-23 NOTE — Progress Notes (Signed)
Pre-visit discussion using our clinic review tool. No additional management support is needed unless otherwise documented below in the visit note.  

## 2013-09-23 NOTE — Progress Notes (Signed)
Patient ID: Barbara Odom, female   DOB: [birth year], 63 y.o.   MRN: [id number]  Patient Active Problem List   Diagnosis Date Noted  . Other chest pain 09/25/2013  . Bronchitis, chronic obstructive w acute bronchitis 06/08/2013  . Cystitis, interstitial 02/21/2013  . Eustachian tube dysfunction, right 08/07/2011  . Lumbago without sciatica 02/26/2011  . Seizure disorder   . Migraine headache   . History of diverticulitis of colon   . Screening for colon cancer 02/23/2011  . Screening for cervical cancer 02/23/2011  . [place] DISEASE, CERVICAL 03/02/2007  . OSTEOPENIA 03/02/2007  . ALLERGIC RHINITIS 03/01/2007  . DIVERTICULOSIS, COLON 03/01/2007    Subjective:  CC:   Chief Complaint  Patient presents with  . Chest Pain    Family HX of heart disease, patient having pain left side above breast,    HPI:   Barbara Odom is a 63 y.o. female who presents for Recurrent chest pressure occurring both at rest and with activity.  Last episode was Saturday evening, lasted 20-30 minutes,  Often with nausea.  . Never with diaphoresis or dyspnea. .  No recent travel ,  Surgery or immoblization .  Increased stress at work.,  Does not want cardiology referral at this time    Past Medical History  Diagnosis Date  . 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 seasonal rhinitis   . Migraine headache   . History of diverticulitis of colon   . Osteopenia     Past Surgical History  Procedure Laterality Date  . Carpal tunnel release  1983    bilateral   . Breast surgery      lumpectomy, benign   . Cervical discectomy  August 2012    [place]       The following portions of the patient's history were reviewed and updated as appropriate: Allergies, current medications, and problem list.    Review of Systems:   Patient denies headache, fevers, malaise, unintentional weight loss, skin rash, eye pain, sinus congestion and sinus pain, sore throat, dysphagia,   hemoptysis , cough, dyspnea, wheezing, chest pain, palpitations, orthopnea, edema, abdominal pain, nausea, melena, diarrhea, constipation, flank pain, dysuria, hematuria, urinary  Frequency, nocturia, numbness, tingling, seizures,  Focal weakness, Loss of consciousness,  Tremor, insomnia, depression, anxiety, and suicidal ideation.     History   Social History  . Marital Status: Married    Spouse Name: N/A    Number of Children: N/A  . Years of Education: N/A   Occupational History  . Not on file.   Social History Main Topics  . Smoking status: Never Smoker   . Smokeless tobacco: Never Used  . Alcohol Use: Yes  . Drug Use: No  . Sexual Activity: Yes   Other Topics Concern  . Not on file   Social History Narrative  . No narrative on file    Objective:  Filed Vitals:   09/23/13 1837  BP: 144/78  Pulse: 85  Temp: 99 F (37.2 C)  Resp: 16     General appearance: alert, cooperative and appears stated age Ears: normal TM's and external ear canals both ears Throat: lips, mucosa, and tongue normal; teeth and gums normal Neck: no adenopathy, no carotid bruit, supple, symmetrical, trachea midline and thyroid not enlarged, symmetric, no tenderness/mass/nodules Back: symmetric, no curvature. ROM normal. No CVA tenderness. Lungs: clear to auscultation bilaterally Heart: regular rate and rhythm, S1, S2 normal, no murmur, click, rub or gallop  Abdomen: soft, non-tender; bowel sounds normal; no masses,  no organomegaly Pulses: 2+ and symmetric Skin: Skin color, texture, turgor normal. No rashes or lesions Lymph nodes: Cervical, supraclavicular, and axillary nodes normal.  Assessment and Plan:  Other chest pain Atypical, but given her FH, I am recommending referral to cardiology for myoview. Baseline EKG is normal   Updated Medication List Outpatient Encounter Prescriptions as of 09/23/2013  Medication Sig  . BLACK COHOSH PO Take by mouth.    . budesonide-formoterol  (SYMBICORT) 160-4.5 MCG/ACT inhaler Inhale 2 puffs into the lungs 2 (two) times daily.  . cetirizine (ZYRTEC) 10 MG tablet Take 10 mg by mouth daily.    . Cholecalciferol (VITAMIN D3) 1000 UNITS CAPS Take by mouth.  . citalopram (CELEXA) 10 MG tablet Take 10 mg by mouth daily.    . cyanocobalamin (,VITAMIN B-12,) 1000 MCG/ML injection Inject 1 mL (1,000 mcg total) into the muscle once. I ml IM injection monthly  . diazepam (VALIUM) 5 MG tablet Take 1 tablet (5 mg total) by mouth daily as needed for anxiety.  Marland Kitchen HYDROcodone-acetaminophen (NORCO/VICODIN) 5-325 MG per tablet Take 1 tablet by mouth every 6 (six) hours as needed for pain.  Marland Kitchen HYDROcodone-homatropine (HYCODAN) 5-1.5 MG/5ML syrup   . hyoscyamine (LEVSIN SL) 0.125 MG SL tablet Place 1 tablet (0.125 mg total) under the tongue daily as needed.  Marland Kitchen ipratropium (ATROVENT HFA) 17 MCG/ACT inhaler Inhale 2 puffs into the lungs every 6 (six) hours.  . Multiple Vitamin (MULTIVITAMIN) tablet Take 1 tablet by mouth daily.    Marland Kitchen omeprazole (PRILOSEC) 40 MG capsule Take 1 capsule by mouth daily.  . ondansetron (ZOFRAN-ODT) 8 MG disintegrating tablet TAKE 1 TABLET EVERY 6 HOURS AS NEEDED FOR NAUSEA AND VOMITING  . PHENObarbital (LUMINAL) 60 MG tablet Take 1 tablet (60 mg total) by mouth daily.  . predniSONE (STERAPRED UNI-PAK) 10 MG tablet 6 tablets on Day 1 , then reduce by 1 tablet daily until gone  . rizatriptan (MAXALT) 10 MG tablet Take 1 tablet (10 mg total) by mouth once as needed for migraine. May repeat in 2 hours if needed  . solifenacin (VESICARE) 5 MG tablet Take 1 tablet (5 mg total) by mouth daily.  . Syringe, Disposable, 1 ML MISC For user with B12 injections  . tretinoin (RETIN-A) 0.025 % cream Apply 1 application topically as needed.  . zolpidem (AMBIEN) 10 MG tablet Take 0.5 tablets (5 mg total) by mouth at bedtime as needed.  . [DISCONTINUED] zolpidem (AMBIEN) 10 MG tablet Take 0.5 tablets (5 mg total) by mouth at bedtime as needed.      Orders Placed This Encounter  Procedures  . MM DIGITAL SCREENING BILATERAL  . EKG 12-Lead    No Follow-up on file.

## 2013-09-23 NOTE — Patient Instructions (Signed)
Your chest pain may be from esophageal spasm or from angina   Try the Levsin the next time you have an episode  Of chest discomfort

## 2013-09-25 ENCOUNTER — Telehealth: Payer: Self-pay | Admitting: Internal Medicine

## 2013-09-25 DIAGNOSIS — R0789 Other chest pain: Secondary | ICD-10-CM | POA: Insufficient documentation

## 2013-09-25 NOTE — Assessment & Plan Note (Signed)
Atypical, but given her FH, I am recommending referral to cardiology for myoview. Baseline EKG is normal

## 2013-10-17 ENCOUNTER — Other Ambulatory Visit: Payer: Self-pay | Admitting: Internal Medicine

## 2013-10-17 MED ORDER — CELECOXIB 200 MG PO CAPS: 200.0000 mg | ORAL_CAPSULE | Freq: Two times a day (BID) | ORAL | Status: DC

## 2013-10-21 ENCOUNTER — Other Ambulatory Visit: Payer: Self-pay | Admitting: Internal Medicine

## 2013-10-21 MED ORDER — PREDNISONE (PAK) 10 MG PO TABS: ORAL_TABLET | ORAL | Status: DC

## 2013-11-13 ENCOUNTER — Ambulatory Visit: Payer: BC Managed Care – PPO

## 2013-11-13 ENCOUNTER — Telehealth: Payer: Self-pay | Admitting: Internal Medicine

## 2013-11-13 MED ORDER — PHENOBARBITAL 60 MG PO TABS: 60.0000 mg | ORAL_TABLET | Freq: Every day | ORAL | Status: DC

## 2013-11-13 NOTE — Telephone Encounter (Signed)
Ok to refill,  printed rx  

## 2013-11-13 NOTE — Telephone Encounter (Signed)
Pt called in and stated left a vm as well but wanted me to send a message back stating she needs a refill on her phenobarbital (LUMINAL) 60mg . She stated she has lost her other prescription and needs another refill.

## 2013-11-13 NOTE — Telephone Encounter (Signed)
Ok to fill 

## 2013-11-14 NOTE — Telephone Encounter (Signed)
Spoke with pt and pharmacist.  Pharmacist states pt has 2-90 day refills remaining on original refill and insurance will pay for refill on 8.30.15.  No need for Rx to be written.  Pt aware

## 2013-12-07 ENCOUNTER — Other Ambulatory Visit: Payer: Self-pay | Admitting: Internal Medicine

## 2013-12-12 ENCOUNTER — Ambulatory Visit
Admission: RE | Admit: 2013-12-12 | Discharge: 2013-12-12 | Disposition: A | Discharge status: Home / Self Care | Payer: BC Managed Care – PPO | Source: Ambulatory Visit | Attending: Internal Medicine | Admitting: Internal Medicine

## 2013-12-12 ENCOUNTER — Ambulatory Visit: Payer: BC Managed Care – PPO

## 2013-12-12 ENCOUNTER — Encounter (INDEPENDENT_AMBULATORY_CARE_PROVIDER_SITE_OTHER): Payer: Self-pay

## 2013-12-12 ENCOUNTER — Encounter: Payer: Self-pay | Admitting: Internal Medicine

## 2013-12-12 DIAGNOSIS — Z1239 Encounter for other screening for malignant neoplasm of breast: Secondary | ICD-10-CM

## 2013-12-23 ENCOUNTER — Other Ambulatory Visit: Payer: Self-pay | Admitting: Neurosurgery

## 2013-12-23 DIAGNOSIS — M5136 Other intervertebral disc degeneration, lumbar region: Secondary | ICD-10-CM

## 2014-01-01 ENCOUNTER — Other Ambulatory Visit: Payer: Self-pay | Admitting: *Deleted

## 2014-01-01 DIAGNOSIS — F419 Anxiety disorder, unspecified: Secondary | ICD-10-CM

## 2014-01-01 MED ORDER — PROPRANOLOL HCL 20 MG PO TABS: 20.0000 mg | ORAL_TABLET | Freq: Three times a day (TID) | ORAL | Status: DC

## 2014-01-01 NOTE — Progress Notes (Signed)
Patient called and ask for refill on propranolol ok to fill last fill 2012, sent Escript

## 2014-01-01 NOTE — Addendum Note (Signed)
Addended by: Crecencio Mc on: 01/01/2014 04:41 PM   Modules accepted: Orders

## 2014-01-03 ENCOUNTER — Ambulatory Visit
Admission: RE | Admit: 2014-01-03 | Discharge: 2014-01-03 | Disposition: A | Discharge status: Home / Self Care | Payer: BC Managed Care – PPO | Source: Ambulatory Visit | Attending: Neurosurgery | Admitting: Neurosurgery

## 2014-01-03 VITALS — BP 138/62 | HR 68

## 2014-01-03 DIAGNOSIS — M5136 Other intervertebral disc degeneration, lumbar region: Secondary | ICD-10-CM

## 2014-01-03 DIAGNOSIS — M545 Low back pain: Secondary | ICD-10-CM

## 2014-01-03 MED ORDER — DIAZEPAM 5 MG PO TABS
10.0000 mg | ORAL_TABLET | Freq: Once | ORAL | Status: AC
  Administered 2014-01-03: 10 mg via ORAL

## 2014-01-03 MED ORDER — IOHEXOL 180 MG/ML  SOLN
20.0000 mL | Freq: Once | INTRAMUSCULAR | Status: AC | PRN
Start: 2014-01-03 — End: 2014-01-03
  Administered 2014-01-03: 20 mL via INTRATHECAL

## 2014-01-03 NOTE — Discharge Instructions (Signed)
Myelogram Discharge Instructions  1. Go home and rest quietly for the next 24 hours.  It is important to lie flat for the next 24 hours.  Get up only to go to the restroom.  You may lie in the bed or on a couch on your back, your stomach, your left side or your right side.  You may have one pillow under your head.  You may have pillows between your knees while you are on your side or under your knees while you are on your back.  2. DO NOT drive today.  Recline the seat as far back as it will go, while still wearing your seat belt, on the way home.  3. You may get up to go to the bathroom as needed.  You may sit up for 10 minutes to eat.  You may resume your normal diet and medications unless otherwise indicated.  Drink lots of extra fluids today and tomorrow.  4. The incidence of headache, nausea, or vomiting is about 5% (one in 20 patients).  If you develop a headache, lie flat and drink plenty of fluids until the headache goes away.  Caffeinated beverages may be helpful.  If you develop severe nausea and vomiting or a headache that does not go away with flat bed rest, call [phone].  5. You may resume normal activities after your 24 hours of bed rest is over; however, do not exert yourself strongly or do any heavy lifting tomorrow. If when you get up you have a headache when standing, go back to bed and force fluids for another 24 hours.  6. Call your physician for a follow-up appointment.  The results of your myelogram will be sent directly to your physician by the following day.  7. If you have any questions or if complications develop after you arrive home, please call [phone].  Discharge instructions have been explained to the patient.  The patient, or the person responsible for the patient, fully understands these instructions.      May resume Celexa and Maxolt/Rizatripten on Oct. 17, 2015, after 8:30 am.

## 2014-01-03 NOTE — Progress Notes (Signed)
Patient states she has been off Celexa and Maxalt for at least the past two days.

## 2014-03-06 ENCOUNTER — Other Ambulatory Visit: Payer: Self-pay | Admitting: *Deleted

## 2014-03-06 MED ORDER — RIZATRIPTAN BENZOATE 10 MG PO TABS: 10.0000 mg | ORAL_TABLET | Freq: Once | ORAL | Status: DC | PRN

## 2014-03-06 NOTE — Telephone Encounter (Signed)
Pt left VM, needing refill Maxalt to [facility]. Rx sent to pharmacy by escript

## 2014-03-11 ENCOUNTER — Other Ambulatory Visit: Payer: Self-pay | Admitting: Internal Medicine

## 2014-03-11 MED ORDER — LACTULOSE 20 GM/30ML PO SOLN: ORAL | Status: DC

## 2014-03-24 ENCOUNTER — Ambulatory Visit (INDEPENDENT_AMBULATORY_CARE_PROVIDER_SITE_OTHER): Payer: BLUE CROSS/BLUE SHIELD | Admitting: Internal Medicine

## 2014-03-24 ENCOUNTER — Encounter: Payer: Self-pay | Admitting: Internal Medicine

## 2014-03-24 VITALS — BP 142/76 | HR 73 | Temp 98.2°F | Resp 14 | Ht 67.0 in | Wt 137.2 lb

## 2014-03-24 DIAGNOSIS — R110 Nausea: Secondary | ICD-10-CM

## 2014-03-24 DIAGNOSIS — K2970 Gastritis, unspecified, without bleeding: Secondary | ICD-10-CM

## 2014-03-24 MED ORDER — ONDANSETRON 8 MG PO TBDP: ORAL_TABLET | ORAL | Status: DC

## 2014-03-24 NOTE — Progress Notes (Signed)
Pre-visit discussion using our clinic review tool. No additional management support is needed unless otherwise documented below in the visit note.  

## 2014-03-24 NOTE — Progress Notes (Signed)
Patient ID: Barbara Odom, female   DOB: 02/24/1951, 64 y.o.   MRN: [id number]   Patient Active Problem List   Diagnosis Date Noted  . Gastritis 03/25/2014  . Other chest pain 09/25/2013  . Bronchitis, chronic obstructive w acute bronchitis 06/08/2013  . Cystitis, interstitial 02/21/2013  . Eustachian tube dysfunction, right 08/07/2011  . Lumbago without sciatica 02/26/2011  . Seizure disorder   . Migraine headache   . History of diverticulitis of colon   . Screening for colon cancer 02/23/2011  . Screening for cervical cancer 02/23/2011  . [place] DISEASE, CERVICAL 03/02/2007  . OSTEOPENIA 03/02/2007  . ALLERGIC RHINITIS 03/01/2007  . DIVERTICULOSIS, COLON 03/01/2007    Subjective:  CC:   Chief Complaint  Patient presents with  . Abdominal Pain    nausea, since 03/09/14, mid upper gastric, patient thinks maybe contributed to taking aleve and advil.    HPI:   Barbara Odom is a 64 y.o. female who presents for evaluation of Gnawing abdominal pain, accompanied by nausea and increased gas and bloating .  She has been taking NSAIDs twice daily for months for management of back pain from herniated disks.  She stopped NSAIDs a few days ago and started taking omeprzole regularly.  She denies black stools and weight loss.      Past Medical History  Diagnosis Date  . 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 seasonal rhinitis   . Migraine headache   . History of diverticulitis of colon   . Osteopenia     Past Surgical History  Procedure Laterality Date  . Carpal tunnel release  1983    bilateral   . Breast surgery      lumpectomy, benign   . Cervical discectomy  August 2012    [place]       The following portions of the patient's history were reviewed and updated as appropriate: Allergies, current medications, and problem list.    Review of Systems:   Patient denies headache, fevers, malaise, unintentional weight loss, skin rash, eye pain,  sinus congestion and sinus pain, sore throat, dysphagia,  hemoptysis , cough, dyspnea, wheezing, chest pain, palpitations, orthopnea, edema, abdominal pain, nausea, melena, diarrhea, constipation, flank pain, dysuria, hematuria, urinary  Frequency, nocturia, numbness, tingling, seizures,  Focal weakness, Loss of consciousness,  Tremor, insomnia, depression, anxiety, and suicidal ideation.     History   Social History  . Marital Status: Married    Spouse Name: N/A    Number of Children: N/A  . Years of Education: N/A   Occupational History  . Not on file.   Social History Main Topics  . Smoking status: Never Smoker   . Smokeless tobacco: Never Used  . Alcohol Use: Yes  . Drug Use: No  . Sexual Activity: Yes   Other Topics Concern  . Not on file   Social History Narrative    Objective:  Filed Vitals:   03/24/14 1405  BP: 142/76  Pulse: 73  Temp: 98.2 F (36.8 C)  Resp: 14     General appearance: alert, cooperative and appears stated age Ears: normal TM's and external ear canals both ears Throat: lips, mucosa, and tongue normal; teeth and gums normal Neck: no adenopathy, no carotid bruit, supple, symmetrical, trachea midline and thyroid not enlarged, symmetric, no tenderness/mass/nodules Back: symmetric, no curvature. ROM normal. No CVA tenderness. Lungs: clear to auscultation bilaterally Heart: regular rate and rhythm, S1, S2 normal, no murmur, click, rub   or gallop Abdomen: soft, non-tender; bowel sounds normal; no masses,  no organomegaly Pulses: 2+ and symmetric Skin: Skin color, texture, turgor normal. No rashes or lesions Lymph nodes: Cervical, supraclavicular, and axillary nodes normal.  Assessment and Plan:  Gastritis NSAID induced  .   Increase omeprazole to twice daily .  H pylori is negative. Home stool test is pending, CBC is normal.  i   Updated Medication List Outpatient Encounter Prescriptions as of 03/24/2014  Medication Sig  . BLACK COHOSH PO  Take by mouth.    . budesonide-formoterol (SYMBICORT) 160-4.5 MCG/ACT inhaler Inhale 2 puffs into the lungs 2 (two) times daily.  . citalopram (CELEXA) 10 MG tablet Take 10 mg by mouth daily.    . cyanocobalamin (,VITAMIN B-12,) 1000 MCG/ML injection Inject 1 mL (1,000 mcg total) into the muscle once. I ml IM injection monthly  . diazepam (VALIUM) 5 MG tablet Take 1 tablet (5 mg total) by mouth daily as needed for anxiety.  Marland Kitchen HYDROcodone-acetaminophen (NORCO/VICODIN) 5-325 MG per tablet Take 1 tablet by mouth every 6 (six) hours as needed for pain.  . hyoscyamine (LEVSIN SL) 0.125 MG SL tablet Place 1 tablet (0.125 mg total) under the tongue daily as needed.  Marland Kitchen ipratropium (ATROVENT HFA) 17 MCG/ACT inhaler Inhale 2 puffs into the lungs every 6 (six) hours.  . Multiple Vitamin (MULTIVITAMIN) tablet Take 1 tablet by mouth daily.    Marland Kitchen omeprazole (PRILOSEC) 40 MG capsule Take 1 capsule by mouth daily.  . ondansetron (ZOFRAN-ODT) 8 MG disintegrating tablet TAKE 1 TABLET EVERY 6 HOURS AS NEEDED FOR NAUSEA AND VOMITING  . PHENObarbital (LUMINAL) 60 MG tablet Take 1 tablet (60 mg total) by mouth daily.  . Probiotic Product (SOLUBLE FIBER/PROBIOTICS PO) Take 1 capsule by mouth daily.  . rizatriptan (MAXALT) 10 MG tablet Take 1 tablet (10 mg total) by mouth once as needed for migraine. May repeat in 2 hours if needed  . Syringe, Disposable, 1 ML MISC For user with B12 injections  . tretinoin (RETIN-A) 0.025 % cream Apply 1 application topically as needed.  . zolpidem (AMBIEN) 10 MG tablet Take 0.5 tablets (5 mg total) by mouth at bedtime as needed.  . [DISCONTINUED] ondansetron (ZOFRAN-ODT) 8 MG disintegrating tablet TAKE 1 TABLET EVERY 6 HOURS AS NEEDED FOR NAUSEA AND VOMITING  . cetirizine (ZYRTEC) 10 MG tablet Take 10 mg by mouth daily.    . Cholecalciferol (VITAMIN D3) 1000 UNITS CAPS Take by mouth.  Marland Kitchen HYDROcodone-homatropine (HYCODAN) 5-1.5 MG/5ML syrup   . Lactulose 20 GM/30ML SOLN 30 ml every 4  hours until constipation is relieved (Patient not taking: Reported on 03/24/2014)  . predniSONE (STERAPRED UNI-PAK) 10 MG tablet 6 tablets on Day 1 , then reduce by 1 tablet daily until gone (Patient not taking: Reported on 03/24/2014)  . predniSONE (STERAPRED UNI-PAK) 10 MG tablet 6 tablets on Day 1 , then reduce by 1 tablet daily until gone (Patient not taking: Reported on 03/24/2014)  . [DISCONTINUED] celecoxib (CELEBREX) 200 MG capsule TAKE ONE CAPSULE BY MOUTH TWICE A DAY (Patient not taking: Reported on 03/24/2014)  . [DISCONTINUED] propranolol (INDERAL) 20 MG tablet Take 1 tablet (20 mg total) by mouth 3 (three) times daily. (Patient not taking: Reported on 03/24/2014)  . [DISCONTINUED] solifenacin (VESICARE) 5 MG tablet Take 1 tablet (5 mg total) by mouth daily. (Patient not taking: Reported on 03/24/2014)     Orders Placed This Encounter  Procedures  . Fecal occult blood, imunochemical  . H. pylori antibody,  IgG  . CBC with Differential  . Comp Met (CMET)    No Follow-up on file.       

## 2014-03-24 NOTE — Patient Instructions (Signed)
You can increase the omeprazole to twice daily  Ok to add mylanta GAS as needed for gas.  Black stools may suggest recent bleeding ulcer and should be reported  NO aleve, Motrin, or alcohol.  Tylenol is OK   Gastritis, Adult Gastritis is soreness and swelling (inflammation) of the lining of the stomach. Gastritis can develop as a sudden onset (acute) or long-term (chronic) condition. If gastritis is not treated, it can lead to stomach bleeding and ulcers. CAUSES  Gastritis occurs when the stomach lining is weak or damaged. Digestive juices from the stomach then inflame the weakened stomach lining. The stomach lining may be weak or damaged due to viral or bacterial infections. One common bacterial infection is the Helicobacter pylori infection. Gastritis can also result from excessive alcohol consumption, taking certain medicines, or having too much acid in the stomach.  SYMPTOMS  In some cases, there are no symptoms. When symptoms are present, they may include:  Pain or a burning sensation in the upper abdomen.  Nausea.  Vomiting.  An uncomfortable feeling of fullness after eating. DIAGNOSIS  Your caregiver may suspect you have gastritis based on your symptoms and a physical exam. To determine the cause of your gastritis, your caregiver may perform the following:  Blood or stool tests to check for the H pylori bacterium.  Gastroscopy. A thin, flexible tube (endoscope) is passed down the esophagus and into the stomach. The endoscope has a light and camera on the end. Your caregiver uses the endoscope to view the inside of the stomach.  Taking a tissue sample (biopsy) from the stomach to examine under a microscope. TREATMENT  Depending on the cause of your gastritis, medicines may be prescribed. If you have a bacterial infection, such as an H pylori infection, antibiotics may be given. If your gastritis is caused by too much acid in the stomach, H2 blockers or antacids may be given. Your  caregiver may recommend that you stop taking aspirin, ibuprofen, or other nonsteroidal anti-inflammatory drugs (NSAIDs). HOME CARE INSTRUCTIONS  Only take over-the-counter or prescription medicines as directed by your caregiver.  If you were given antibiotic medicines, take them as directed. Finish them even if you start to feel better.  Drink enough fluids to keep your urine clear or pale yellow.  Avoid foods and drinks that make your symptoms worse, such as:  Caffeine or alcoholic drinks.  Chocolate.  Peppermint or mint flavorings.  Garlic and onions.  Spicy foods.  Citrus fruits, such as oranges, lemons, or limes.  Tomato-based foods such as sauce, chili, salsa, and pizza.  Fried and fatty foods.  Eat small, frequent meals instead of large meals. SEEK IMMEDIATE MEDICAL CARE IF:   You have black or dark red stools.  You vomit blood or material that looks like coffee grounds.  You are unable to keep fluids down.  Your abdominal pain gets worse.  You have a fever.  You do not feel better after 1 week.  You have any other questions or concerns. MAKE SURE YOU:  Understand these instructions.  Will watch your condition.  Will get help right away if you are not doing well or get worse. Document Released: 03/01/2001 Document Revised: 09/06/2011 Document Reviewed: 04/20/2011 [facility] Patient Information 2015 [place], [place]. This information is not intended to replace advice given to you by your health care provider. Make sure you discuss any questions you have with your health care provider.

## 2014-03-25 ENCOUNTER — Encounter: Payer: Self-pay | Admitting: Internal Medicine

## 2014-03-25 DIAGNOSIS — K2970 Gastritis, unspecified, without bleeding: Secondary | ICD-10-CM | POA: Insufficient documentation

## 2014-03-25 LAB — CBC WITH DIFFERENTIAL/PLATELET
BASOS ABS: 0 10*3/uL (ref 0.0–0.1)
Basophils Relative: 0.4 % (ref 0.0–3.0)
Eosinophils Absolute: 0.1 10*3/uL (ref 0.0–0.7)
Eosinophils Relative: 1.5 % (ref 0.0–5.0)
HCT: 42.6 % (ref 36.0–46.0)
Hemoglobin: 14.1 g/dL (ref 12.0–15.0)
LYMPHS PCT: 33.8 % (ref 12.0–46.0)
Lymphs Abs: 1.9 10*3/uL (ref 0.7–4.0)
MCHC: 33.1 g/dL (ref 30.0–36.0)
MCV: 94.3 fl (ref 78.0–100.0)
MONOS PCT: 5.3 % (ref 3.0–12.0)
Monocytes Absolute: 0.3 10*3/uL (ref 0.1–1.0)
NEUTROS PCT: 59 % (ref 43.0–77.0)
Neutro Abs: 3.3 10*3/uL (ref 1.4–7.7)
PLATELETS: 283 10*3/uL (ref 150.0–400.0)
RBC: 4.52 Mil/uL (ref 3.87–5.11)
RDW: 14.4 % (ref 11.5–15.5)
WBC: 5.6 10*3/uL (ref 4.0–10.5)

## 2014-03-25 LAB — COMPREHENSIVE METABOLIC PANEL
ALBUMIN: 4.6 g/dL (ref 3.5–5.2)
ALT: 18 U/L (ref 0–35)
AST: 27 U/L (ref 0–37)
Alkaline Phosphatase: 44 U/L (ref 39–117)
BUN: 19 mg/dL (ref 6–23)
CO2: 30 mEq/L (ref 19–32)
Calcium: 9.8 mg/dL (ref 8.4–10.5)
Chloride: 100 mEq/L (ref 96–112)
Creatinine, Ser: 0.7 mg/dL (ref 0.4–1.2)
GFR: 98.33 mL/min (ref >60.00)
Glucose, Bld: 82 mg/dL (ref 70–99)
Potassium: 3.8 mEq/L (ref 3.5–5.1)
SODIUM: 139 meq/L (ref 135–145)
Total Bilirubin: 0.7 mg/dL (ref 0.2–1.2)
Total Protein: 8.1 g/dL (ref 6.0–8.3)

## 2014-03-25 LAB — H. PYLORI ANTIBODY, IGG: H Pylori IgG: NEGATIVE

## 2014-03-25 NOTE — Assessment & Plan Note (Addendum)
NSAID induced  .   Increase omeprazole to twice daily .  H pylori is negative. Home stool test is pending, CBC is normal.  i

## 2014-03-26 ENCOUNTER — Other Ambulatory Visit: Payer: Self-pay | Admitting: Internal Medicine

## 2014-03-28 ENCOUNTER — Other Ambulatory Visit (INDEPENDENT_AMBULATORY_CARE_PROVIDER_SITE_OTHER): Payer: BLUE CROSS/BLUE SHIELD

## 2014-03-28 DIAGNOSIS — R110 Nausea: Secondary | ICD-10-CM

## 2014-03-28 LAB — FECAL OCCULT BLOOD, IMMUNOCHEMICAL: Fecal Occult Bld: NEGATIVE

## 2014-03-31 ENCOUNTER — Encounter: Payer: Self-pay | Admitting: *Deleted

## 2014-04-10 ENCOUNTER — Encounter: Payer: Self-pay | Admitting: Neurosurgery

## 2014-04-21 ENCOUNTER — Other Ambulatory Visit: Payer: Self-pay | Admitting: Internal Medicine

## 2014-04-21 ENCOUNTER — Encounter: Payer: Self-pay | Admitting: Neurosurgery

## 2014-04-21 MED ORDER — AZITHROMYCIN 250 MG PO TABS: ORAL_TABLET | ORAL | Status: DC

## 2014-05-20 ENCOUNTER — Encounter
Admit: 2014-05-20 | Disposition: A | Discharge status: Home / Self Care | Payer: Self-pay | Attending: Neurosurgery | Admitting: Neurosurgery

## 2014-05-22 ENCOUNTER — Other Ambulatory Visit: Payer: Self-pay | Admitting: Internal Medicine

## 2014-05-22 DIAGNOSIS — G4700 Insomnia, unspecified: Secondary | ICD-10-CM

## 2014-05-22 MED ORDER — ZOLPIDEM TARTRATE 10 MG PO TABS: 5.0000 mg | ORAL_TABLET | Freq: Every evening | ORAL | Status: DC | PRN

## 2014-05-22 MED ORDER — PHENOBARBITAL 60 MG PO TABS: 60.0000 mg | ORAL_TABLET | Freq: Every day | ORAL | Status: DC

## 2014-05-22 NOTE — Telephone Encounter (Signed)
Patient called requested refill on phenobarbital and Ambien ok to fill?

## 2014-05-22 NOTE — Telephone Encounter (Signed)
Ok to refill,  Authorized in epic 

## 2014-05-22 NOTE — Telephone Encounter (Signed)
Faxed to pharmacy

## 2014-06-03 ENCOUNTER — Other Ambulatory Visit: Payer: Self-pay | Admitting: Internal Medicine

## 2014-06-03 MED ORDER — AZITHROMYCIN 500 MG PO TABS: 500.0000 mg | ORAL_TABLET | Freq: Every day | ORAL | Status: DC

## 2014-06-03 MED ORDER — AZITHROMYCIN 250 MG PO TABS: ORAL_TABLET | ORAL | Status: DC

## 2014-06-20 ENCOUNTER — Encounter
Admit: 2014-06-20 | Disposition: A | Discharge status: Home / Self Care | Payer: Self-pay | Attending: Neurosurgery | Admitting: Neurosurgery

## 2014-07-21 ENCOUNTER — Other Ambulatory Visit: Payer: Self-pay | Admitting: Internal Medicine

## 2014-10-13 ENCOUNTER — Other Ambulatory Visit: Payer: Self-pay | Admitting: Obstetrics & Gynecology

## 2014-10-13 DIAGNOSIS — Z1231 Encounter for screening mammogram for malignant neoplasm of breast: Secondary | ICD-10-CM

## 2014-10-16 ENCOUNTER — Ambulatory Visit
Admission: RE | Admit: 2014-10-16 | Discharge: 2014-10-16 | Disposition: A | Discharge status: Home / Self Care | Payer: BLUE CROSS/BLUE SHIELD | Source: Ambulatory Visit | Attending: Obstetrics & Gynecology | Admitting: Obstetrics & Gynecology

## 2014-10-16 ENCOUNTER — Other Ambulatory Visit: Payer: Self-pay | Admitting: Obstetrics & Gynecology

## 2014-10-16 DIAGNOSIS — Z1231 Encounter for screening mammogram for malignant neoplasm of breast: Secondary | ICD-10-CM

## 2014-11-04 ENCOUNTER — Other Ambulatory Visit: Payer: Self-pay | Admitting: Internal Medicine

## 2014-11-04 MED ORDER — PREDNISONE 10 MG PO TABS: ORAL_TABLET | ORAL | Status: DC

## 2014-11-04 MED ORDER — NYSTATIN 100000 UNIT/ML MT SUSP: 500000.0000 [IU] | Freq: Four times a day (QID) | OROMUCOSAL | Status: DC

## 2014-11-13 ENCOUNTER — Ambulatory Visit (INDEPENDENT_AMBULATORY_CARE_PROVIDER_SITE_OTHER): Payer: BLUE CROSS/BLUE SHIELD | Admitting: Internal Medicine

## 2014-11-13 ENCOUNTER — Encounter: Payer: Self-pay | Admitting: Internal Medicine

## 2014-11-13 ENCOUNTER — Encounter (INDEPENDENT_AMBULATORY_CARE_PROVIDER_SITE_OTHER): Payer: Self-pay

## 2014-11-13 ENCOUNTER — Ambulatory Visit
Admission: RE | Admit: 2014-11-13 | Discharge: 2014-11-13 | Disposition: A | Discharge status: Home / Self Care | Payer: BLUE CROSS/BLUE SHIELD | Source: Ambulatory Visit | Attending: Internal Medicine | Admitting: Internal Medicine

## 2014-11-13 VITALS — BP 126/78 | HR 75 | Temp 98.2°F | Resp 12 | Ht 67.0 in | Wt 139.0 lb

## 2014-11-13 DIAGNOSIS — Z0000 Encounter for general adult medical examination without abnormal findings: Secondary | ICD-10-CM

## 2014-11-13 DIAGNOSIS — R05 Cough: Secondary | ICD-10-CM

## 2014-11-13 DIAGNOSIS — J449 Chronic obstructive pulmonary disease, unspecified: Secondary | ICD-10-CM | POA: Insufficient documentation

## 2014-11-13 DIAGNOSIS — R091 Pleurisy: Secondary | ICD-10-CM

## 2014-11-13 DIAGNOSIS — R059 Cough, unspecified: Secondary | ICD-10-CM

## 2014-11-13 DIAGNOSIS — E785 Hyperlipidemia, unspecified: Secondary | ICD-10-CM

## 2014-11-13 DIAGNOSIS — Z113 Encounter for screening for infections with a predominantly sexual mode of transmission: Secondary | ICD-10-CM | POA: Diagnosis not present

## 2014-11-13 DIAGNOSIS — E559 Vitamin D deficiency, unspecified: Secondary | ICD-10-CM

## 2014-11-13 DIAGNOSIS — R5383 Other fatigue: Secondary | ICD-10-CM

## 2014-11-13 DIAGNOSIS — Z87448 Personal history of other diseases of urinary system: Secondary | ICD-10-CM | POA: Diagnosis not present

## 2014-11-13 DIAGNOSIS — J440 Chronic obstructive pulmonary disease with acute lower respiratory infection: Secondary | ICD-10-CM | POA: Diagnosis not present

## 2014-11-13 MED ORDER — TIOTROPIUM BROMIDE MONOHYDRATE 18 MCG IN CAPS: 18.0000 ug | ORAL_CAPSULE | Freq: Every day | RESPIRATORY_TRACT | Status: DC

## 2014-11-13 MED ORDER — AZITHROMYCIN 250 MG PO TABS: ORAL_TABLET | ORAL | Status: DC

## 2014-11-13 NOTE — Assessment & Plan Note (Addendum)
Recurrent ,  With one round of antibiotics,  Two rounds of steroids.  Using symbicort.  Adding spiriva for persistent productive cough , and chest x ray for abnormal lung sounds LUL and pleurisy in the anterior left upper lobe .  Chest x ray was normal except for hyperaerated lungs.

## 2014-11-13 NOTE — Assessment & Plan Note (Addendum)
With decreased phonation in the LUL .Marland Kitchen  Given recent prolonged episode of bronchitis,  PA lateral chest xray was ordered and normal except for hyper aerated lungs

## 2014-11-13 NOTE — Progress Notes (Addendum)
Patient ID: Barbara Odom, female    DOB: [birth year]  Age: 64 y.o. MRN: [id number]  The patient is here for annual  wellness examination and management of other chronic and acute problems.  Breast and pelvic exam are done regularly by her GYN.  PAP was negative at [facility]  3d mammogram normal at [facility]    Dermatology evaluation was done for evaluation of right thigh lesion possbile basal cellCa biopsy is pending.   Colonoscopy done 2012? By [facility]    The risk factors are reflected in the social history.  The roster of all physicians providing medical care to patient - is listed in the Snapshot section of the chart.    Activities of daily living:  The patient is 100% independent in all ADLs: dressing, toileting, feeding as well as independent mobility  Home safety : The patient has smoke detectors in the home. They wear seatbelts.  There are no firearms at home. There is no violence in the home.   There is no risks for hepatitis, STDs or HIV. There is no   history of blood transfusion. They have no travel history to infectious disease endemic areas of the world.  The patient has seen their dentist in the last six month. They have seen their eye doctor in the last year. They do not  have excessive sun exposure. Discussed the need for sun protection: hats, long sleeves and use of sunscreen if there is significant sun exposure.   Diet: the importance of a healthy diet is discussed. They do have a healthy diet.  The benefits of regular aerobic exercise were discussed. She walks 4 times per week ,  20 minutes.   Depression screen: there are no signs or vegative symptoms of depression- irritability, change in appetite, anhedonia, sadness/tearfullness.    The following portions of the patient's history were reviewed and updated as appropriate: allergies, current medications, past family history, past medical history,  past surgical history, past social history  and problem  list.  Visual acuity was not assessed per patient preference since she has regular follow up with her ophthalmologist. Hearing and body mass index were assessed and reviewed.   During the course of the visit the patient was educated and counseled about appropriate screening and preventive services including : fall prevention , diabetes screening, nutrition counseling, colorectal cancer screening, and recommended immunizations.    CC: The primary encounter diagnosis was Encounter for preventive health examination. Diagnoses of Screen for STD (sexually transmitted disease), Other fatigue, Vitamin D deficiency, Hyperlipidemia, Pleurisy, Cough, Bronchitis, chronic obstructive w acute bronchitis, and History of hematuria were also pertinent to this visit.   Hematuria:  Was referred to [facility] for hematuria.  Did not see Jeffie Pollock was seen initially by other provider who attributed her microscopic hematuria to anxiety  But ordered cysto.gram which was normal.  Bilateral renal cysts were noted .    Was treated in early August  for bronchitis with prolonged steroid taper and antibiotics.  Second round of antibiotics given for persistent symptoms.  Still Coughing up mucous that is light yellow .  Has an area on left side of anterior chest wall that is painful with deep breathing and coughing.     History Barbara Odom has a past medical history of grand mal; seasonal rhinitis; Migraine headache; History of diverticulitis of colon; and Osteopenia.   She has past surgical history that includes Carpal tunnel release (1983); Breast surgery; Cervical discectomy (August 2012); Breast biopsy (Left,  1997); and Breast biopsy (Right, 2005).   Her family history includes Cancer in her maternal grandfather and maternal grandmother; Heart disease in her mother.She reports that she has never smoked. She has never used smokeless tobacco. She reports that she drinks alcohol. She reports that she does not use illicit  drugs.  Outpatient Prescriptions Prior to Visit  Medication Sig Dispense Refill  . BLACK COHOSH PO Take by mouth.      . Cholecalciferol (VITAMIN D3) 1000 UNITS CAPS Take by mouth.    . cyanocobalamin (,VITAMIN B-12,) 1000 MCG/ML injection Inject 1 mL (1,000 mcg total) into the muscle once. I ml IM injection monthly 10 mL 2  . diazepam (VALIUM) 5 MG tablet Take 1 tablet (5 mg total) by mouth daily as needed for anxiety. 30 tablet 3  . HYDROcodone-acetaminophen (NORCO/VICODIN) 5-325 MG per tablet Take 1 tablet by mouth every 6 (six) hours as needed for pain. 30 tablet 3  . hyoscyamine (LEVSIN SL) 0.125 MG SL tablet Place 1 tablet (0.125 mg total) under the tongue daily as needed. 90 tablet 2  . ipratropium (ATROVENT HFA) 17 MCG/ACT inhaler Inhale 2 puffs into the lungs every 6 (six) hours.    . Multiple Vitamin (MULTIVITAMIN) tablet Take 1 tablet by mouth daily.      Marland Kitchen omeprazole (PRILOSEC) 40 MG capsule Take 1 capsule by mouth daily.    . ondansetron (ZOFRAN-ODT) 8 MG disintegrating tablet TAKE 1 TABLET EVERY 6 HOURS AS NEEDED FOR NAUSEA AND VOMITING 30 tablet 2  . PHENObarbital (LUMINAL) 60 MG tablet Take 1 tablet (60 mg total) by mouth daily. 90 tablet 3  . Probiotic Product (SOLUBLE FIBER/PROBIOTICS PO) Take 1 capsule by mouth daily.    . rizatriptan (MAXALT) 10 MG tablet Take 1 tablet (10 mg total) by mouth once as needed for migraine. May repeat in 2 hours if needed 30 tablet 2  . SYMBICORT 160-4.5 MCG/ACT inhaler INHALE 2 PUFFS INTO THE LUNGS 2 TIMES DAILY. 10.2 Inhaler 5  . Syringe, Disposable, 1 ML MISC For user with B12 injections 25 each 0  . tretinoin (RETIN-A) 0.025 % cream Apply 1 application topically as needed.    . zolpidem (AMBIEN) 10 MG tablet Take 0.5 tablets (5 mg total) by mouth at bedtime as needed. 30 tablet 5  . cetirizine (ZYRTEC) 10 MG tablet Take 10 mg by mouth daily.      . Lactulose 20 GM/30ML SOLN 30 ml every 4 hours until constipation is relieved 236 mL 0  .  citalopram (CELEXA) 10 MG tablet Take 10 mg by mouth daily.      Marland Kitchen azithromycin (ZITHROMAX) 250 MG tablet 2 tablets on Day, 1 tablet daily thereafter 6 tablet 0  . azithromycin (ZITHROMAX) 500 MG tablet Take 1 tablet (500 mg total) by mouth daily. 7 tablet 0  . HYDROcodone-homatropine (HYCODAN) 5-1.5 MG/5ML syrup     . nystatin (MYCOSTATIN) 100000 UNIT/ML suspension Take 5 mLs (500,000 Units total) by mouth 4 (four) times daily. 100 mL 0  . predniSONE (DELTASONE) 10 MG tablet 6 tablets daily for 3 days, then reduce by 1 tablet every 2 days until gone (Patient not taking: Reported on 11/13/2014) 60 tablet 0   No facility-administered medications prior to visit.    Review of Systems   Patient denies headache, fevers, malaise, unintentional weight loss, skin rash, eye pain, sinus congestion and sinus pain, sore throat, dysphagia,  hemoptysis , cough, dyspnea, wheezing, chest pain, palpitations, orthopnea, edema, abdominal pain, nausea, melena, diarrhea,  constipation, flank pain, dysuria, hematuria, urinary  Frequency, nocturia, numbness, tingling, seizures,  Focal weakness, Loss of consciousness,  Tremor, insomnia, depression, anxiety, and suicidal ideation.      Objective:  BP 126/78 mmHg  Pulse 75  Temp(Src) 98.2 F (36.8 C) (Oral)  Resp 12  Ht 5\' 7"  (1.702 m)  Wt 139 lb (63.05 kg)  BMI 21.77 kg/m2  SpO2 97%  Physical Exam   General appearance: alert, cooperative and appears stated age Head: Normocephalic, without obvious abnormality, atraumatic Eyes: conjunctivae/corneas clear. PERRL, EOM's intact. Fundi benign. Ears: normal TM's and external ear canals both ears Nose: Nares normal. Septum midline. Mucosa normal. No drainage or sinus tenderness. Throat: lips, mucosa, and tongue normal; teeth and gums normal Neck: no adenopathy, no carotid bruit, no JVD, supple, symmetrical, trachea midline and thyroid not enlarged, symmetric, no tenderness/mass/nodules Lungs: decreased tactile  fremitus and phonation noted LUL,  Otherwise clear to auscultation bilaterally Heart: regular rate and rhythm, S1, S2 normal, no murmur, click, rub or gallop Abdomen: soft, non-tender; bowel sounds normal; no masses,  no organomegaly Extremities: extremities normal, atraumatic, no cyanosis or edema Pulses: 2+ and symmetric Skin: Skin color, texture, turgor normal. No rashes or lesions Neurologic: Alert and oriented X 3, normal strength and tone. Normal symmetric reflexes. Normal coordination and gait.     Assessment & Plan:   Problem List Items Addressed This Visit      Unprioritized   Bronchitis, chronic obstructive w acute bronchitis    Recurrent ,  With one round of antibiotics,  Two rounds of steroids.  Using symbicort.  Adding spiriva for persistent productive cough , and chest x ray for abnormal lung sounds LUL and pleurisy in the anterior left upper lobe .  Chest x ray was normal except for hyperaerated lungs.      Relevant Medications   tiotropium (SPIRIVA HANDIHALER) 18 MCG inhalation capsule   azithromycin (ZITHROMAX) 250 MG tablet   Pleurisy    With decreased phonation in the LUL .Marland Kitchen  Given recent prolonged episode of bronchitis,  PA lateral chest xray was ordered and normal except for hyper aerated lungs      Relevant Orders   DG Chest 2 View (Completed)   History of hematuria   Encounter for preventive health examination - Primary    Annual comprehensive preventive exam was done as well as an evaluation and management of acute and chronic conditions .  During the course of the visit the patient was educated and counseled about appropriate screening and preventive services including :  diabetes screening, lipid analysis with projected  10 year  risk for CAD  colorectal cancer screening, and recommended immunizations.  Printed recommendations for health maintenance screenings was given.        Other Visit Diagnoses    Screen for STD (sexually transmitted disease)         Relevant Orders    Hepatitis C antibody    HIV antibody    Other fatigue        Relevant Orders    CBC with Differential/Platelet    Comprehensive metabolic panel    TSH    Iron and TIBC    Vitamin D deficiency        Relevant Orders    Vit D  25 hydroxy (rtn osteoporosis monitoring)    Hyperlipidemia        Relevant Orders    Lipid panel    Cough        Relevant Orders  DG Chest 2 View (Completed)       I have discontinued Ms. Scherr's cetirizine, HYDROcodone-homatropine, Lactulose, azithromycin, azithromycin, predniSONE, and nystatin. I am also having her start on tiotropium and azithromycin. Additionally, I am having her maintain her citalopram, multivitamin, BLACK COHOSH PO, Vitamin D3, HYDROcodone-acetaminophen, diazepam, omeprazole, tretinoin, cyanocobalamin, Syringe (Disposable), ipratropium, hyoscyamine, rizatriptan, Probiotic Product (SOLUBLE FIBER/PROBIOTICS PO), ondansetron, PHENObarbital, zolpidem, SYMBICORT, and fluocinonide ointment.  Meds ordered this encounter  Medications  . fluocinonide ointment (LIDEX) 0.05 %    Sig: Apply 1 application topically 2 (two) times daily.  Marland Kitchen tiotropium (SPIRIVA HANDIHALER) 18 MCG inhalation capsule    Sig: Place 1 capsule (18 mcg total) into inhaler and inhale daily.    Dispense:  30 capsule    Refill:  5  . azithromycin (ZITHROMAX) 250 MG tablet    Sig: 2 tablets on Day 1,   Then one tablet daily for 4 days    Dispense:  6 tablet    Refill:  0    Medications Discontinued During This Encounter  Medication Reason  . azithromycin (ZITHROMAX) 250 MG tablet Completed Course  . azithromycin (ZITHROMAX) 734 MG tablet Duplicate  . cetirizine (ZYRTEC) 10 MG tablet Completed Course  . HYDROcodone-homatropine (HYCODAN) 5-1.5 MG/5ML syrup Patient Preference  . Lactulose 20 GM/30ML SOLN Error  . nystatin (MYCOSTATIN) 100000 UNIT/ML suspension Patient Preference  . predniSONE (DELTASONE) 10 MG tablet Completed Course    Follow-up:  No Follow-up on file.   Crecencio Mc, MD

## 2014-11-13 NOTE — Progress Notes (Signed)
Pre-visit discussion using our clinic review tool. No additional management support is needed unless otherwise documented below in the visit note.  

## 2014-11-14 ENCOUNTER — Other Ambulatory Visit: Payer: Self-pay | Admitting: Internal Medicine

## 2014-11-14 ENCOUNTER — Encounter: Payer: Self-pay | Admitting: Internal Medicine

## 2014-11-14 ENCOUNTER — Other Ambulatory Visit: Payer: Self-pay | Admitting: *Deleted

## 2014-11-16 ENCOUNTER — Encounter: Payer: Self-pay | Admitting: Internal Medicine

## 2014-11-16 DIAGNOSIS — Z0000 Encounter for general adult medical examination without abnormal findings: Secondary | ICD-10-CM | POA: Insufficient documentation

## 2014-11-16 DIAGNOSIS — Z87448 Personal history of other diseases of urinary system: Secondary | ICD-10-CM | POA: Insufficient documentation

## 2014-11-16 NOTE — Patient Instructions (Signed)

## 2014-11-16 NOTE — Addendum Note (Signed)
Addended by: Crecencio Mc on: 11/16/2014 09:42 AM   Modules accepted: Miquel Dunn

## 2014-11-16 NOTE — Assessment & Plan Note (Addendum)
Annual comprehensive preventive exam was done as well as an evaluation and management of acute and chronic conditions .  During the course of the visit the patient was educated and counseled about appropriate screening and preventive services including :  diabetes screening, lipid analysis with projected  10 year  risk for CAD  colorectal cancer screening, and recommended immunizations.  Printed recommendations for health maintenance screenings was given.

## 2014-11-21 ENCOUNTER — Other Ambulatory Visit (INDEPENDENT_AMBULATORY_CARE_PROVIDER_SITE_OTHER): Payer: BLUE CROSS/BLUE SHIELD

## 2014-11-21 DIAGNOSIS — Z113 Encounter for screening for infections with a predominantly sexual mode of transmission: Secondary | ICD-10-CM

## 2014-11-21 DIAGNOSIS — E559 Vitamin D deficiency, unspecified: Secondary | ICD-10-CM

## 2014-11-21 DIAGNOSIS — E785 Hyperlipidemia, unspecified: Secondary | ICD-10-CM

## 2014-11-21 DIAGNOSIS — R5383 Other fatigue: Secondary | ICD-10-CM | POA: Diagnosis not present

## 2014-11-21 LAB — CBC WITH DIFFERENTIAL/PLATELET
BASOS ABS: 0 10*3/uL (ref 0.0–0.1)
Basophils Relative: 0.5 % (ref 0.0–3.0)
EOS PCT: 4 % (ref 0.0–5.0)
Eosinophils Absolute: 0.3 10*3/uL (ref 0.0–0.7)
HCT: 39.5 % (ref 36.0–46.0)
HEMOGLOBIN: 13.2 g/dL (ref 12.0–15.0)
LYMPHS ABS: 2.8 10*3/uL (ref 0.7–4.0)
Lymphocytes Relative: 41.6 % (ref 12.0–46.0)
MCHC: 33.4 g/dL (ref 30.0–36.0)
MCV: 96.5 fl (ref 78.0–100.0)
Monocytes Absolute: 0.5 10*3/uL (ref 0.1–1.0)
Monocytes Relative: 7.4 % (ref 3.0–12.0)
NEUTROS PCT: 46.5 % (ref 43.0–77.0)
Neutro Abs: 3.1 10*3/uL (ref 1.4–7.7)
Platelets: 247 10*3/uL (ref 150.0–400.0)
RBC: 4.09 Mil/uL (ref 3.87–5.11)
RDW: 14.5 % (ref 11.5–15.5)
WBC: 6.8 10*3/uL (ref 4.0–10.5)

## 2014-11-21 LAB — COMPREHENSIVE METABOLIC PANEL
ALBUMIN: 3.7 g/dL (ref 3.5–5.2)
ALK PHOS: 35 U/L — AB (ref 39–117)
ALT: 17 U/L (ref 0–35)
AST: 18 U/L (ref 0–37)
BILIRUBIN TOTAL: 0.4 mg/dL (ref 0.2–1.2)
BUN: 21 mg/dL (ref 6–23)
CO2: 34 mEq/L — ABNORMAL HIGH (ref 19–32)
Calcium: 9.4 mg/dL (ref 8.4–10.5)
Chloride: 104 mEq/L (ref 96–112)
Creatinine, Ser: 0.85 mg/dL (ref 0.40–1.20)
GFR: 71.99 mL/min (ref >60.00)
Glucose, Bld: 83 mg/dL (ref 70–99)
Potassium: 4.4 mEq/L (ref 3.5–5.1)
Sodium: 143 mEq/L (ref 135–145)
TOTAL PROTEIN: 6.6 g/dL (ref 6.0–8.3)

## 2014-11-21 LAB — IRON AND TIBC
%SAT: 21 % (ref 11–50)
IRON: 59 ug/dL (ref 45–160)
TIBC: 286 ug/dL (ref 250–450)
UIBC: 227 ug/dL (ref 125–400)

## 2014-11-21 LAB — LIPID PANEL
CHOL/HDL RATIO: 2
Cholesterol: 261 mg/dL — ABNORMAL HIGH (ref 0–200)
HDL: 105 mg/dL (ref >39.00)
LDL Cholesterol: 143 mg/dL — ABNORMAL HIGH (ref 0–99)
NONHDL: 155.64
Triglycerides: 61 mg/dL (ref 0.0–149.0)
VLDL: 12.2 mg/dL (ref 0.0–40.0)

## 2014-11-21 LAB — VITAMIN D 25 HYDROXY (VIT D DEFICIENCY, FRACTURES): VITD: 48.11 ng/mL (ref 30.00–100.00)

## 2014-11-21 LAB — TSH: TSH: 0.78 u[IU]/mL (ref 0.35–4.50)

## 2014-11-22 LAB — HEPATITIS C ANTIBODY: HCV Ab: NEGATIVE

## 2014-11-22 LAB — HIV ANTIBODY (ROUTINE TESTING W REFLEX): HIV 1&2 Ab, 4th Generation: NONREACTIVE

## 2015-01-30 ENCOUNTER — Other Ambulatory Visit: Payer: Self-pay | Admitting: Internal Medicine

## 2015-01-30 MED ORDER — PREDNISONE 10 MG PO TABS: ORAL_TABLET | ORAL | Status: DC

## 2015-02-19 ENCOUNTER — Ambulatory Visit: Payer: Self-pay | Admitting: Physician Assistant

## 2015-02-19 ENCOUNTER — Encounter: Payer: Self-pay | Admitting: Physician Assistant

## 2015-02-19 VITALS — BP 134/80 | HR 80 | Temp 97.9°F

## 2015-02-19 DIAGNOSIS — J018 Other acute sinusitis: Secondary | ICD-10-CM

## 2015-02-19 MED ORDER — AZITHROMYCIN 250 MG PO TABS: ORAL_TABLET | ORAL | Status: DC

## 2015-02-19 NOTE — Progress Notes (Signed)
S: C/o runny nose and congestion for 3 days, no fever, chills, cp/sob, v/d; mucus is yellow and thick, cough is sporadic, c/o of facial and dental pain.   Using otc meds:   O: PE: vitals wnl, nad, perrl eomi, normocephalic, tms dull, nasal mucosa red and swollen, throat injected, neck supple no lymph, lungs c t a, cv rrr, neuro intact  A:  Acute sinusitis   P: zpack, drink fluids, continue regular meds , use otc meds of choice, return if not improving in 5 days, return earlier if worsening

## 2015-02-24 ENCOUNTER — Telehealth: Payer: Self-pay | Admitting: Internal Medicine

## 2015-02-24 NOTE — Telephone Encounter (Signed)
Pt lvm asking for a refill of her Maxalt 10 mg prn.

## 2015-02-25 ENCOUNTER — Other Ambulatory Visit: Payer: Self-pay

## 2015-02-25 MED ORDER — RIZATRIPTAN BENZOATE 5 MG PO TABS: 5.0000 mg | ORAL_TABLET | ORAL | Status: DC | PRN

## 2015-02-25 NOTE — Telephone Encounter (Signed)
Please advise refill? 

## 2015-02-25 NOTE — Telephone Encounter (Signed)
Ok to refill,  Refill sent  

## 2015-02-25 NOTE — Telephone Encounter (Signed)
Sent to provider to review as it hasn't been refilled in a short period.

## 2015-02-25 NOTE — Telephone Encounter (Signed)
Refill sent and completed.

## 2015-02-26 ENCOUNTER — Other Ambulatory Visit: Payer: Self-pay

## 2015-02-26 ENCOUNTER — Other Ambulatory Visit: Payer: Self-pay | Admitting: Internal Medicine

## 2015-02-26 NOTE — Telephone Encounter (Signed)
Patient states it for 10 mg and it was written for 5 mg. Please advise if okay to refill for 10 mg?

## 2015-02-27 MED ORDER — RIZATRIPTAN BENZOATE 10 MG PO TABS: 10.0000 mg | ORAL_TABLET | ORAL | Status: DC | PRN

## 2015-02-27 NOTE — Telephone Encounter (Signed)
Yes,  Changed to 10 mg and sent but first time sent for 30 day supply (10 pills)  , then re sent for  90 day supply as well,(30 pills).

## 2015-03-04 ENCOUNTER — Other Ambulatory Visit: Payer: Self-pay | Admitting: Internal Medicine

## 2015-03-04 MED ORDER — PHENOBARBITAL 60 MG PO TABS: 60.0000 mg | ORAL_TABLET | Freq: Every day | ORAL | Status: DC

## 2015-03-04 NOTE — Telephone Encounter (Signed)
Please advise 

## 2015-03-04 NOTE — Telephone Encounter (Signed)
Ashleigh,    Did you say the patient wanted this sent to local pharmacy? I ',m not sure what to do with the refill request

## 2015-03-05 ENCOUNTER — Other Ambulatory Visit: Payer: Self-pay | Admitting: Nurse Practitioner

## 2015-03-05 MED ORDER — PHENOBARBITAL 60 MG PO TABS: 60.0000 mg | ORAL_TABLET | Freq: Every day | ORAL | Status: DC

## 2015-03-05 NOTE — Telephone Encounter (Signed)
She was wondering if she could get a paper copy?

## 2015-04-14 ENCOUNTER — Other Ambulatory Visit: Payer: Self-pay | Admitting: Internal Medicine

## 2015-04-14 ENCOUNTER — Other Ambulatory Visit: Payer: Self-pay

## 2015-04-14 MED ORDER — HYOSCYAMINE SULFATE 0.125 MG SL SUBL: 0.1250 mg | SUBLINGUAL_TABLET | Freq: Every day | SUBLINGUAL | Status: DC | PRN

## 2015-04-15 ENCOUNTER — Telehealth: Payer: Self-pay | Admitting: Internal Medicine

## 2015-04-15 ENCOUNTER — Other Ambulatory Visit: Payer: Self-pay | Admitting: Internal Medicine

## 2015-04-15 DIAGNOSIS — R350 Frequency of micturition: Secondary | ICD-10-CM

## 2015-04-15 MED ORDER — FLUCONAZOLE 150 MG PO TABS: 150.0000 mg | ORAL_TABLET | Freq: Every day | ORAL | Status: DC

## 2015-04-15 NOTE — Telephone Encounter (Signed)
Patient scheduled for lab tomorrow patient working cannot come in today.

## 2015-04-15 NOTE — Telephone Encounter (Signed)
Patient can come by for urine check.  Does not need an office visit .  Labs ordered.

## 2015-04-16 ENCOUNTER — Encounter: Payer: Self-pay | Admitting: Physician Assistant

## 2015-04-16 ENCOUNTER — Ambulatory Visit: Payer: Self-pay | Admitting: Physician Assistant

## 2015-04-16 ENCOUNTER — Other Ambulatory Visit: Payer: Self-pay

## 2015-04-16 ENCOUNTER — Other Ambulatory Visit (INDEPENDENT_AMBULATORY_CARE_PROVIDER_SITE_OTHER): Payer: BLUE CROSS/BLUE SHIELD

## 2015-04-16 VITALS — BP 174/96 | HR 80 | Temp 97.6°F

## 2015-04-16 DIAGNOSIS — R350 Frequency of micturition: Secondary | ICD-10-CM

## 2015-04-16 DIAGNOSIS — R300 Dysuria: Secondary | ICD-10-CM

## 2015-04-16 DIAGNOSIS — K5732 Diverticulitis of large intestine without perforation or abscess without bleeding: Secondary | ICD-10-CM

## 2015-04-16 DIAGNOSIS — N390 Urinary tract infection, site not specified: Secondary | ICD-10-CM

## 2015-04-16 DIAGNOSIS — R319 Hematuria, unspecified: Secondary | ICD-10-CM

## 2015-04-16 LAB — POCT URINALYSIS DIPSTICK
Bilirubin, UA: NEGATIVE
Glucose, UA: NEGATIVE
Ketones, UA: NEGATIVE
Leukocytes, UA: NEGATIVE
Nitrite, UA: POSITIVE
PROTEIN UA: NEGATIVE
Spec Grav, UA: >=1.03
UROBILINOGEN UA: 0.2
pH, UA: 5.5

## 2015-04-16 MED ORDER — METRONIDAZOLE 500 MG PO TABS: 500.0000 mg | ORAL_TABLET | Freq: Three times a day (TID) | ORAL | Status: DC

## 2015-04-16 MED ORDER — CIPROFLOXACIN HCL 500 MG PO TABS: 500.0000 mg | ORAL_TABLET | Freq: Two times a day (BID) | ORAL | Status: DC

## 2015-04-16 NOTE — Progress Notes (Signed)
S: c/o llq pain, chills but no known fever, feels bad, had loose stools for last 2 weeks, ?if its diverticulitis vs uti, some nausea and decreased appetite, no blood in stool  O: vitals w elevated bp, lungs c t a, cv rrr, abd soft tender in llq and pubic area, bs normal all 4 quads, ua +nitrites and blood  A: uti, llq pain most likely diverticulitis  P: cipro 500mg  bid x 7d, flagyll 500mg  tid x 7d; pt to return if worsening, see pcp or go to ER

## 2015-04-23 ENCOUNTER — Other Ambulatory Visit: Payer: Self-pay | Admitting: Internal Medicine

## 2015-04-23 MED ORDER — DICYCLOMINE HCL 10 MG PO CAPS: 10.0000 mg | ORAL_CAPSULE | Freq: Three times a day (TID) | ORAL | Status: DC

## 2015-04-29 ENCOUNTER — Encounter: Payer: Self-pay | Admitting: Physician Assistant

## 2015-04-29 ENCOUNTER — Ambulatory Visit: Payer: Self-pay | Admitting: Physician Assistant

## 2015-04-29 VITALS — BP 150/90 | Temp 98.5°F

## 2015-04-29 DIAGNOSIS — R300 Dysuria: Secondary | ICD-10-CM

## 2015-04-29 LAB — POCT URINALYSIS DIPSTICK
Bilirubin, UA: NEGATIVE
Glucose, UA: NEGATIVE
Ketones, UA: NEGATIVE
Leukocytes, UA: NEGATIVE
NITRITE UA: NEGATIVE
PROTEIN UA: NEGATIVE
RBC UA: NEGATIVE
SPEC GRAV UA: 1.015
UROBILINOGEN UA: 0.2
pH, UA: 8.5

## 2015-04-29 MED ORDER — CIPROFLOXACIN HCL 500 MG PO TABS: 500.0000 mg | ORAL_TABLET | Freq: Two times a day (BID) | ORAL | Status: DC

## 2015-04-29 NOTE — Progress Notes (Signed)
S: c/o urinary freq and odor, no fever chills, is traveling to [place] and very concerned she will get a uti while out of the country  O: vitals wnl, nad, ua wnl  A: dysuria  P: rx for cipro 500mg  bid x 3 for travel

## 2015-09-08 ENCOUNTER — Other Ambulatory Visit: Payer: Self-pay | Admitting: Nurse Practitioner

## 2015-09-09 NOTE — Telephone Encounter (Signed)
Refill authorized; patient well known to me

## 2015-09-09 NOTE — Telephone Encounter (Signed)
Refill request for Luminal, last seen [id number], last filled [id number].  Please advise.

## 2015-09-29 ENCOUNTER — Other Ambulatory Visit: Payer: Self-pay | Admitting: Obstetrics & Gynecology

## 2015-09-29 DIAGNOSIS — Z1231 Encounter for screening mammogram for malignant neoplasm of breast: Secondary | ICD-10-CM

## 2015-10-07 ENCOUNTER — Telehealth: Payer: Self-pay | Admitting: Internal Medicine

## 2015-10-07 DIAGNOSIS — G4700 Insomnia, unspecified: Secondary | ICD-10-CM

## 2015-10-07 NOTE — Telephone Encounter (Signed)
Has appt in September, please advise refill, thanks

## 2015-10-07 NOTE — Telephone Encounter (Signed)
Pt needs a refill on zolpidem (AMBIEN) 10 MG tablet sent to [facility] [address]... Please advise

## 2015-10-08 MED ORDER — ZOLPIDEM TARTRATE 10 MG PO TABS: 5.0000 mg | ORAL_TABLET | Freq: Every evening | ORAL | Status: DC | PRN

## 2015-10-08 NOTE — Telephone Encounter (Signed)
Refill authorized. Patient well known to md

## 2015-12-14 ENCOUNTER — Ambulatory Visit (INDEPENDENT_AMBULATORY_CARE_PROVIDER_SITE_OTHER): Payer: BLUE CROSS/BLUE SHIELD | Admitting: Internal Medicine

## 2015-12-14 ENCOUNTER — Encounter (INDEPENDENT_AMBULATORY_CARE_PROVIDER_SITE_OTHER): Payer: Self-pay

## 2015-12-14 ENCOUNTER — Encounter: Payer: Self-pay | Admitting: Internal Medicine

## 2015-12-14 VITALS — BP 128/62 | HR 83 | Temp 98.3°F | Resp 12 | Ht 66.0 in | Wt 137.5 lb

## 2015-12-14 DIAGNOSIS — E785 Hyperlipidemia, unspecified: Secondary | ICD-10-CM | POA: Diagnosis not present

## 2015-12-14 DIAGNOSIS — Z0000 Encounter for general adult medical examination without abnormal findings: Secondary | ICD-10-CM

## 2015-12-14 DIAGNOSIS — E559 Vitamin D deficiency, unspecified: Secondary | ICD-10-CM

## 2015-12-14 DIAGNOSIS — Z299 Encounter for prophylactic measures, unspecified: Secondary | ICD-10-CM | POA: Diagnosis not present

## 2015-12-14 DIAGNOSIS — Z79899 Other long term (current) drug therapy: Secondary | ICD-10-CM | POA: Diagnosis not present

## 2015-12-14 DIAGNOSIS — D649 Anemia, unspecified: Secondary | ICD-10-CM

## 2015-12-14 LAB — IRON AND TIBC
%SAT: 28 % (ref 11–50)
IRON: 79 ug/dL (ref 45–160)
TIBC: 281 ug/dL (ref 250–450)
UIBC: 202 ug/dL (ref 125–400)

## 2015-12-14 MED ORDER — PREDNISONE 10 MG PO TABS: ORAL_TABLET | ORAL | 0 refills | Status: DC

## 2015-12-14 MED ORDER — AZITHROMYCIN 250 MG PO TABS: ORAL_TABLET | ORAL | 0 refills | Status: DC

## 2015-12-14 MED ORDER — FLUCONAZOLE 150 MG PO TABS: 150.0000 mg | ORAL_TABLET | Freq: Every day | ORAL | 1 refills | Status: DC

## 2015-12-14 NOTE — Progress Notes (Signed)
Patient ID: Barbara Odom, female    DOB: 10-31-1950  Age: 65 y.o. MRN: [id number]  The patient is here for annual  wellness examination and management of other chronic and acute problems.  PAP  And Mammogram  Done by Damaris Hippo ESI done annually by [facility]  Colonoscopy due 2018 Tdap 2014     The risk factors are reflected in the social history.  The roster of all physicians providing medical care to patient - is listed in the Snapshot section of the chart.  Home safety : The patient has smoke detectors in the home. They wear seatbelts.  There are no firearms at home. There is no violence in the home.   There is no risks for hepatitis, STDs or HIV. There is no   history of blood transfusion. They have no travel history to infectious disease endemic areas of the world.  The patient has seen their dentist in the last six month. They have seen their eye doctor in the last year.   They do not  have excessive sun exposure. Discussed the need for sun protection: hats, long sleeves and use of sunscreen if there is significant sun exposure.   Diet: the importance of a healthy diet is discussed. They do have a healthy diet.  The benefits of regular aerobic exercise were discussed. She walks 4 times per week ,  20 minutes.   Depression screen: there are no signs or vegative symptoms of depression- irritability, change in appetite, anhedonia, sadness/tearfullness.  The following portions of the patient's history were reviewed and updated as appropriate: allergies, current medications, past family history, past medical history,  past surgical history, past social history  and problem list.  Visual acuity was not assessed per patient preference since she has regular follow up with her ophthalmologist. Hearing and body mass index were assessed and reviewed.   During the course of the visit the patient was educated and counseled about appropriate screening and preventive services including :  fall prevention , diabetes screening, nutrition counseling, colorectal cancer screening, and recommended immunizations.    CC: There were no encounter diagnoses.  History Barbara Odom has a past medical history of grand mal; History of diverticulitis of colon; Migraine headache; Osteopenia; and seasonal rhinitis.   She has a past surgical history that includes Carpal tunnel release (1983); Breast surgery; Cervical discectomy (August 2012); Breast biopsy (Left, 1997); and Breast biopsy (Right, 2005).   Her family history includes Cancer in her maternal grandfather and maternal grandmother; Heart disease in her mother.She reports that she has never smoked. She has never used smokeless tobacco. She reports that she drinks alcohol. She reports that she does not use drugs.  Outpatient Medications Prior to Visit  Medication Sig Dispense Refill  . BLACK COHOSH PO Take by mouth.      . Cholecalciferol (VITAMIN D3) 1000 UNITS CAPS Take by mouth.    . cyanocobalamin (,VITAMIN B-12,) 1000 MCG/ML injection Inject 1 mL (1,000 mcg total) into the muscle once. I ml IM injection monthly 10 mL 2  . diazepam (VALIUM) 5 MG tablet TAKE 1 TABLET BY ORAL ROUTE EVERY 8 HOURS AS NEEDED  1  . dicyclomine (BENTYL) 10 MG capsule Take 1 capsule (10 mg total) by mouth 4 (four) times daily -  before meals and at bedtime. 120 capsule 1  . Multiple Vitamin (MULTIVITAMIN) tablet Take 1 tablet by mouth daily.      Marland Kitchen omeprazole (PRILOSEC) 40 MG capsule Take 1 capsule by mouth daily.    Marland Kitchen  ondansetron (ZOFRAN-ODT) 8 MG disintegrating tablet TAKE 1 TABLET EVERY 6 HOURS AS NEEDED FOR NAUSEA AND VOMITING 30 tablet 2  . PHENObarbital (LUMINAL) 60 MG tablet TAKE 1 TABLET BY MOUTH DAILY 90 tablet 1  . Probiotic Product (SOLUBLE FIBER/PROBIOTICS PO) Take 1 capsule by mouth daily.    . rizatriptan (MAXALT) 10 MG tablet Take 1 tablet (10 mg total) by mouth as needed for migraine. May repeat in 2 hours if needed 30 tablet 3  . SYMBICORT  160-4.5 MCG/ACT inhaler INHALE 2 PUFFS INTO THE LUNGS 2 TIMES DAILY. 10.2 Inhaler 5  . tiotropium (SPIRIVA HANDIHALER) 18 MCG inhalation capsule Place 1 capsule (18 mcg total) into inhaler and inhale daily. 30 capsule 5  . tretinoin (RETIN-A) 0.025 % cream Apply 1 application topically as needed.    . zolpidem (AMBIEN) 10 MG tablet Take 0.5 tablets (5 mg total) by mouth at bedtime as needed. 30 tablet 5  . azithromycin (ZITHROMAX Z-PAK) 250 MG tablet 2 pills today then 1 pill a day for 4 days (Patient not taking: Reported on 12/14/2015) 6 each 0  . ciprofloxacin (CIPRO) 500 MG tablet Take 1 tablet (500 mg total) by mouth 2 (two) times daily. (Patient not taking: Reported on 12/14/2015) 6 tablet 0  . fluconazole (DIFLUCAN) 150 MG tablet Take 1 tablet (150 mg total) by mouth daily. (Patient not taking: Reported on 12/14/2015) 2 tablet 0  . fluocinonide ointment (LIDEX) [id number] % Apply 1 application topically 2 (two) times daily.    . hyoscyamine (LEVSIN SL) 0.125 MG SL tablet Place 1 tablet (0.125 mg total) under the tongue daily as needed. (Patient not taking: Reported on 12/14/2015) 90 tablet 3  . ipratropium (ATROVENT HFA) 17 MCG/ACT inhaler Inhale 2 puffs into the lungs every 6 (six) hours.    Marland Kitchen LEVSIN/SL 0.125 MG SL tablet PLACE 1 UNDER THE TONGUE DAILY AS NEEDED (Patient not taking: Reported on 12/14/2015) 90 tablet 0  . metroNIDAZOLE (FLAGYL) 500 MG tablet Take 1 tablet (500 mg total) by mouth 3 (three) times daily. (Patient not taking: Reported on 12/14/2015) 21 tablet 0   No facility-administered medications prior to visit.     Review of Systems   Patient denies headache, fevers, malaise, unintentional weight loss, skin rash, eye pain, sinus congestion and sinus pain, sore throat, dysphagia,  hemoptysis , cough, dyspnea, wheezing, chest pain, palpitations, orthopnea, edema, abdominal pain, nausea, melena, diarrhea, constipation, flank pain, dysuria, hematuria, urinary  Frequency, nocturia, numbness,  tingling, seizures,  Focal weakness, Loss of consciousness,  Tremor, insomnia, depression, anxiety, and suicidal ideation.     Objective:  BP 128/62   Pulse 83   Temp 98.3 F (36.8 C) (Oral)   Resp 12   Ht 5\' 6"  (1.676 m)   Wt 137 lb 8 oz (62.4 kg)   SpO2 97%   BMI 22.19 kg/m   Physical Exam   General appearance: alert, cooperative and appears stated age Ears: normal TM's and external ear canals both ears Throat: lips, mucosa, and tongue normal; teeth and gums normal Neck: no adenopathy, no carotid bruit, supple, symmetrical, trachea midline and thyroid not enlarged, symmetric, no tenderness/mass/nodules Back: symmetric, no curvature. ROM normal. No CVA tenderness. Lungs: clear to auscultation bilaterally Heart: regular rate and rhythm, S1, S2 normal, no murmur, click, rub or gallop Abdomen: soft, non-tender; bowel sounds normal; no masses,  no organomegaly Pulses: 2+ and symmetric Skin: Skin color, texture, turgor normal. No rashes or lesions Lymph nodes: Cervical, supraclavicular, and axillary nodes normal.  Assessment & Plan:   Problem List Items Addressed This Visit    None    Visit Diagnoses   None.     I am having Barbara Odom maintain her multivitamin, BLACK COHOSH PO, Vitamin D3, omeprazole, tretinoin, cyanocobalamin, ipratropium, Probiotic Product (SOLUBLE FIBER/PROBIOTICS PO), ondansetron, SYMBICORT, fluocinonide ointment, tiotropium, azithromycin, rizatriptan, hyoscyamine, LEVSIN/SL, fluconazole, metroNIDAZOLE, diazepam, dicyclomine, ciprofloxacin, PHENObarbital, and zolpidem.  No orders of the defined types were placed in this encounter.   There are no discontinued medications.  Follow-up: No Follow-up on file.   Crecencio Mc, MD

## 2015-12-14 NOTE — Patient Instructions (Signed)

## 2015-12-14 NOTE — Progress Notes (Signed)
Pre-visit discussion using our clinic review tool. No additional management support is needed unless otherwise documented below in the visit note.  

## 2015-12-15 LAB — COMPREHENSIVE METABOLIC PANEL
ALBUMIN: 4 g/dL (ref 3.5–5.2)
ALK PHOS: 41 U/L (ref 39–117)
ALT: 15 U/L (ref 0–35)
AST: 22 U/L (ref 0–37)
BILIRUBIN TOTAL: 0.5 mg/dL (ref 0.2–1.2)
BUN: 11 mg/dL (ref 6–23)
CALCIUM: 9.3 mg/dL (ref 8.4–10.5)
CO2: 31 mEq/L (ref 19–32)
CREATININE: 0.81 mg/dL (ref 0.40–1.20)
Chloride: 103 mEq/L (ref 96–112)
GFR: 75.85 mL/min (ref >60.00)
Glucose, Bld: 89 mg/dL (ref 70–99)
Potassium: 3.4 mEq/L — ABNORMAL LOW (ref 3.5–5.1)
Sodium: 143 mEq/L (ref 135–145)
Total Protein: 7.4 g/dL (ref 6.0–8.3)

## 2015-12-15 LAB — CBC WITH DIFFERENTIAL/PLATELET
BASOS PCT: 0.5 % (ref 0.0–3.0)
Basophils Absolute: 0 10*3/uL (ref 0.0–0.1)
EOS PCT: 6 % — AB (ref 0.0–5.0)
Eosinophils Absolute: 0.3 10*3/uL (ref 0.0–0.7)
HCT: 38.8 % (ref 36.0–46.0)
Hemoglobin: 13.3 g/dL (ref 12.0–15.0)
LYMPHS ABS: 1.9 10*3/uL (ref 0.7–4.0)
Lymphocytes Relative: 32.4 % (ref 12.0–46.0)
MCHC: 34.3 g/dL (ref 30.0–36.0)
MCV: 92.9 fl (ref 78.0–100.0)
MONO ABS: 0.3 10*3/uL (ref 0.1–1.0)
MONOS PCT: 5.8 % (ref 3.0–12.0)
NEUTROS ABS: 3.2 10*3/uL (ref 1.4–7.7)
NEUTROS PCT: 55.3 % (ref 43.0–77.0)
PLATELETS: 243 10*3/uL (ref 150.0–400.0)
RBC: 4.18 Mil/uL (ref 3.87–5.11)
RDW: 14.1 % (ref 11.5–15.5)
WBC: 5.8 10*3/uL (ref 4.0–10.5)

## 2015-12-15 LAB — LIPID PANEL
CHOLESTEROL: 211 mg/dL — AB (ref 0–200)
HDL: 86.9 mg/dL (ref >39.00)
LDL Cholesterol: 113 mg/dL — ABNORMAL HIGH (ref 0–99)
NONHDL: 124.24
Total CHOL/HDL Ratio: 2
Triglycerides: 57 mg/dL (ref 0.0–149.0)
VLDL: 11.4 mg/dL (ref 0.0–40.0)

## 2015-12-15 LAB — FERRITIN: Ferritin: 21.6 ng/mL (ref 10.0–291.0)

## 2015-12-15 LAB — VITAMIN D 25 HYDROXY (VIT D DEFICIENCY, FRACTURES): VITD: 42.67 ng/mL (ref 30.00–100.00)

## 2015-12-15 NOTE — Assessment & Plan Note (Signed)
Annual comprehensive preventive exam was done as well as an evaluation and management of chronic conditions .  During the course of the visit the patient was educated and counseled about appropriate screening and preventive services including :  diabetes screening, lipid analysis with projected  10 year  risk for CAD , nutrition counseling, breast, cervical and colorectal cancer screening, and recommended immunizations.  Printed recommendations for health maintenance screenings was given 

## 2015-12-16 LAB — PHENOBARBITAL LEVEL: PHENOBARBITAL: 12.3 mg/L — AB (ref 15.0–40.0)

## 2015-12-17 ENCOUNTER — Encounter: Payer: Self-pay | Admitting: *Deleted

## 2016-02-14 LAB — HM PAP SMEAR: HM Pap smear: NORMAL

## 2016-02-25 ENCOUNTER — Encounter: Payer: Self-pay | Admitting: Physician Assistant

## 2016-02-25 ENCOUNTER — Other Ambulatory Visit: Payer: Self-pay | Admitting: Physician Assistant

## 2016-02-25 ENCOUNTER — Ambulatory Visit: Payer: Self-pay | Admitting: Physician Assistant

## 2016-02-25 VITALS — BP 138/90 | HR 76 | Temp 98.3°F

## 2016-02-25 DIAGNOSIS — J208 Acute bronchitis due to other specified organisms: Secondary | ICD-10-CM

## 2016-02-25 MED ORDER — PREDNISONE 10 MG (48) PO TBPK: ORAL_TABLET | Freq: Every day | ORAL | 0 refills | Status: DC

## 2016-02-25 MED ORDER — MOMETASONE FUROATE 50 MCG/ACT NA SUSP: 2.0000 | Freq: Every day | NASAL | 12 refills | Status: DC

## 2016-02-25 MED ORDER — AZITHROMYCIN 250 MG PO TABS: ORAL_TABLET | ORAL | 0 refills | Status: DC

## 2016-02-25 NOTE — Progress Notes (Signed)
Barbara Odom

## 2016-02-25 NOTE — Progress Notes (Signed)
S: C/o cough and congestion with wheezing and chest pain, chest is sore from coughing, denies fever, chills, + cough is dry and hacking; keeping pt awake at night;  denies cardiac type chest pain or sob, v/d, abd pain Remainder ros neg  O: vitals wnl, nad, tms clear, throat injected, neck supple no lymph, lungs with wheezing in left upper lung only, cv rrr, neuro intact, cough is dry and barking  A:  Acute bronchitis   P:  rx medication: zpack, sterapred ds 10mg  12 d dose pack, nasonex; , use otc meds, tylenol or motrin as needed for fever/chills, return if not better in 3 -5 days, return earlier if worsening

## 2016-02-29 ENCOUNTER — Other Ambulatory Visit: Payer: Self-pay | Admitting: Internal Medicine

## 2016-03-01 NOTE — Telephone Encounter (Signed)
This was discontinued at patient's last OV on 12/14/15 by Dr. Derrel Nip. Okay to refill?

## 2016-03-04 ENCOUNTER — Ambulatory Visit: Payer: Self-pay | Admitting: Physician Assistant

## 2016-03-04 ENCOUNTER — Encounter: Payer: Self-pay | Admitting: Physician Assistant

## 2016-03-04 VITALS — BP 138/80 | Temp 98.5°F

## 2016-03-04 DIAGNOSIS — J209 Acute bronchitis, unspecified: Secondary | ICD-10-CM

## 2016-03-04 MED ORDER — LEVOFLOXACIN 500 MG PO TABS: 500.0000 mg | ORAL_TABLET | Freq: Every day | ORAL | 0 refills | Status: DC

## 2016-03-04 NOTE — Progress Notes (Signed)
S: C/o cough and congestion with wheezing and chest pain, chest is sore from coughing, denies fever, chills: r cough is dry and hacking; keeping pt awake at night;  denies cardiac type chest pain or sob, v/d, abd pain Remainder ros neg, finished zpack, and is still on steroids, switched her inhaler to the anticholenergic one  O: vitals wnl, nad, tms clear, throat injected, neck supple no lymph, lungs with wheezing in lower lungs b/l, cv rrr, neuro intact  A:  Acute bronchitis   P:  rx medication: levaquin 500mg  qd  use otc meds, tylenol or motrin as needed for fever/chills, return if not better in 3 -5 days, return earlier if worsening

## 2016-03-16 ENCOUNTER — Other Ambulatory Visit: Payer: Self-pay | Admitting: Physician Assistant

## 2016-03-16 MED ORDER — NYSTATIN 100000 UNIT/ML MT SUSP: 5.0000 mL | Freq: Four times a day (QID) | OROMUCOSAL | 2 refills | Status: DC

## 2016-03-16 NOTE — Progress Notes (Unsigned)
Pt called and has thrush in her mouth from the antibiotics and steroids + inhaler, ?if we could call in nystatin Nystatin suspension rx sent to [facility]

## 2016-03-17 ENCOUNTER — Telehealth: Payer: Self-pay

## 2016-03-17 ENCOUNTER — Other Ambulatory Visit: Payer: Self-pay | Admitting: Internal Medicine

## 2016-03-17 NOTE — Telephone Encounter (Signed)
Last filled 12/11/15. Last OV 12/14/15. Has no follow up visit scheduled.

## 2016-03-17 NOTE — Telephone Encounter (Signed)
Rx Luminal faxed to [facility] pharmacy

## 2016-03-28 ENCOUNTER — Other Ambulatory Visit: Payer: Self-pay | Admitting: Internal Medicine

## 2016-04-30 ENCOUNTER — Other Ambulatory Visit: Payer: Self-pay | Admitting: Internal Medicine

## 2016-05-30 ENCOUNTER — Other Ambulatory Visit: Payer: Self-pay

## 2016-05-30 NOTE — Telephone Encounter (Signed)
Pleas advise for refill, last refilled on 02/27/2015 and last OV was 12/14/2015, no follow up on file. thanks

## 2016-05-31 MED ORDER — RIZATRIPTAN BENZOATE 10 MG PO TABS: 10.0000 mg | ORAL_TABLET | ORAL | 3 refills | Status: DC | PRN

## 2016-05-31 NOTE — Telephone Encounter (Signed)
Yes, [facility] correct pharmacy and resend if necessary

## 2016-05-31 NOTE — Telephone Encounter (Signed)
Left a message to return a call to the office, thanks

## 2016-06-01 ENCOUNTER — Other Ambulatory Visit: Payer: Self-pay

## 2016-06-01 MED ORDER — RIZATRIPTAN BENZOATE 10 MG PO TABS: 10.0000 mg | ORAL_TABLET | ORAL | 3 refills | Status: DC | PRN

## 2016-06-01 NOTE — Telephone Encounter (Signed)
Spoke with pt and she stated that her pharmacy is now [facility] order. The pharmacy was changed in chart and medication was resent to correct pharmacy.

## 2016-06-23 ENCOUNTER — Other Ambulatory Visit: Payer: Self-pay | Admitting: Obstetrics & Gynecology

## 2016-06-23 DIAGNOSIS — Z780 Asymptomatic menopausal state: Secondary | ICD-10-CM

## 2016-08-01 ENCOUNTER — Ambulatory Visit
Admission: RE | Admit: 2016-08-01 | Discharge: 2016-08-01 | Disposition: A | Discharge status: Home / Self Care | Payer: BLUE CROSS/BLUE SHIELD | Source: Ambulatory Visit | Attending: Obstetrics & Gynecology | Admitting: Obstetrics & Gynecology

## 2016-08-01 DIAGNOSIS — M85852 Other specified disorders of bone density and structure, left thigh: Secondary | ICD-10-CM | POA: Insufficient documentation

## 2016-08-01 DIAGNOSIS — Z780 Asymptomatic menopausal state: Secondary | ICD-10-CM

## 2016-08-16 ENCOUNTER — Other Ambulatory Visit: Payer: Self-pay | Admitting: Internal Medicine

## 2016-08-16 DIAGNOSIS — G4700 Insomnia, unspecified: Secondary | ICD-10-CM

## 2016-08-16 LAB — HM DEXA SCAN

## 2016-08-17 NOTE — Telephone Encounter (Signed)
Last filled 03/17/16, Last OV 12/14/15. Has no follow up visit scheduled.

## 2016-08-18 ENCOUNTER — Encounter: Payer: Self-pay | Admitting: Internal Medicine

## 2016-08-18 NOTE — Telephone Encounter (Signed)
Printed signed and faxed

## 2016-08-18 NOTE — Telephone Encounter (Signed)
Refill for 30 days only.  OFFICE VISIT NEEDED prior to any more refills 

## 2016-08-23 ENCOUNTER — Telehealth: Payer: Self-pay | Admitting: *Deleted

## 2016-08-23 MED ORDER — PHENOBARBITAL 60 MG PO TABS: ORAL_TABLET | ORAL | 0 refills | Status: DC

## 2016-08-23 NOTE — Telephone Encounter (Signed)
Refilled: 08/18/2016 Last OV: 12/14/2015 Next OV: 12/14/2016

## 2016-08-23 NOTE — Telephone Encounter (Signed)
Medication Refill requested for : phenobarbital -pt requested a week supply, her Rx came up short for one week .  Pharmacy: [facility] [address]  Return Contact : [phone]

## 2016-08-23 NOTE — Telephone Encounter (Signed)
7 day supply sent to local pharmacy

## 2016-08-25 NOTE — Telephone Encounter (Signed)
[facility]

## 2016-08-25 NOTE — Telephone Encounter (Signed)
Spoke with pt and informed her that the rx has been refaxed.

## 2016-08-27 ENCOUNTER — Other Ambulatory Visit: Payer: Self-pay | Admitting: Internal Medicine

## 2016-09-07 ENCOUNTER — Ambulatory Visit: Payer: Self-pay | Admitting: Physician Assistant

## 2016-09-07 VITALS — BP 115/70 | HR 77 | Temp 98.5°F

## 2016-09-07 DIAGNOSIS — K5792 Diverticulitis of intestine, part unspecified, without perforation or abscess without bleeding: Secondary | ICD-10-CM

## 2016-09-07 MED ORDER — CIPROFLOXACIN HCL 500 MG PO TABS: 500.0000 mg | ORAL_TABLET | Freq: Two times a day (BID) | ORAL | 0 refills | Status: DC

## 2016-09-07 MED ORDER — METRONIDAZOLE 500 MG PO TABS: 500.0000 mg | ORAL_TABLET | Freq: Three times a day (TID) | ORAL | 0 refills | Status: DC

## 2016-09-07 NOTE — Progress Notes (Signed)
S: c/o llq pain, states has hx of diverticulitis and has been eating a lot of popcorn lately, 2 days ago pain started and has bloating, belching, and some pencil like stools which is typical of her when she has diverticulitis, no fever/chills, no v/d  O: vitals wnl, nad, lungs c t a, cv rrr, abd soft tender in llq, bs normal, n/v intact  A: acute diverticulitis  P: cipro 500mg  tid, flagyl 500mg  tid, go to ER if worsening

## 2016-10-06 ENCOUNTER — Inpatient Hospital Stay
Admission: EM | Admit: 2016-10-06 | Discharge: 2016-10-08 | DRG: 194 | Disposition: A | Discharge status: Home / Self Care | Payer: BLUE CROSS/BLUE SHIELD | Attending: Internal Medicine | Admitting: Internal Medicine

## 2016-10-06 ENCOUNTER — Emergency Department: Payer: BLUE CROSS/BLUE SHIELD

## 2016-10-06 ENCOUNTER — Encounter: Payer: Self-pay | Admitting: *Deleted

## 2016-10-06 ENCOUNTER — Other Ambulatory Visit: Payer: Self-pay | Admitting: Internal Medicine

## 2016-10-06 DIAGNOSIS — R0789 Other chest pain: Secondary | ICD-10-CM

## 2016-10-06 DIAGNOSIS — J440 Chronic obstructive pulmonary disease with acute lower respiratory infection: Secondary | ICD-10-CM | POA: Diagnosis present

## 2016-10-06 DIAGNOSIS — R079 Chest pain, unspecified: Secondary | ICD-10-CM

## 2016-10-06 DIAGNOSIS — E876 Hypokalemia: Secondary | ICD-10-CM | POA: Diagnosis present

## 2016-10-06 DIAGNOSIS — J189 Pneumonia, unspecified organism: Secondary | ICD-10-CM

## 2016-10-06 DIAGNOSIS — M8580 Other specified disorders of bone density and structure, unspecified site: Secondary | ICD-10-CM | POA: Diagnosis present

## 2016-10-06 DIAGNOSIS — Z79899 Other long term (current) drug therapy: Secondary | ICD-10-CM

## 2016-10-06 DIAGNOSIS — G40409 Other generalized epilepsy and epileptic syndromes, not intractable, without status epilepticus: Secondary | ICD-10-CM | POA: Diagnosis present

## 2016-10-06 DIAGNOSIS — Z880 Allergy status to penicillin: Secondary | ICD-10-CM

## 2016-10-06 DIAGNOSIS — R9431 Abnormal electrocardiogram [ECG] [EKG]: Secondary | ICD-10-CM

## 2016-10-06 DIAGNOSIS — D72829 Elevated white blood cell count, unspecified: Secondary | ICD-10-CM

## 2016-10-06 LAB — BASIC METABOLIC PANEL
ANION GAP: 11 (ref 5–15)
BUN: 14 mg/dL (ref 6–20)
CO2: 26 mmol/L (ref 22–32)
Calcium: 9.5 mg/dL (ref 8.9–10.3)
Chloride: 102 mmol/L (ref 101–111)
Creatinine, Ser: 0.82 mg/dL (ref 0.44–1.00)
GFR calc Af Amer: >60 mL/min (ref >60)
GLUCOSE: 116 mg/dL — AB (ref 65–99)
POTASSIUM: 3.3 mmol/L — AB (ref 3.5–5.1)
Sodium: 139 mmol/L (ref 135–145)

## 2016-10-06 LAB — CBC
HEMATOCRIT: 37.2 % (ref 35.0–47.0)
HEMOGLOBIN: 13.1 g/dL (ref 12.0–16.0)
MCH: 32.2 pg (ref 26.0–34.0)
MCHC: 35.2 g/dL (ref 32.0–36.0)
MCV: 91.5 fL (ref 80.0–100.0)
Platelets: 216 10*3/uL (ref 150–440)
RBC: 4.06 MIL/uL (ref 3.80–5.20)
RDW: 13.3 % (ref 11.5–14.5)
WBC: 11.3 10*3/uL — AB (ref 3.6–11.0)

## 2016-10-06 LAB — TROPONIN I: Troponin I: <0.03 ng/mL (ref <0.03)

## 2016-10-06 MED ORDER — FENTANYL CITRATE (PF) 100 MCG/2ML IJ SOLN
50.0000 ug | Freq: Once | INTRAMUSCULAR | Status: AC
  Administered 2016-10-07: 50 ug via INTRAVENOUS
  Filled 2016-10-06: qty 2

## 2016-10-06 NOTE — ED Provider Notes (Signed)
[facility] Emergency Department Provider Note  Time seen: 11:55 PM  I have reviewed the triage vital signs and the nursing notes.   HISTORY  Chief Complaint Chest Pain    HPI SAE HANDRICH is a 66 y.o. female presents to the emergency department for right-sided chest pain. According to the patient she was doing heavy cleaning at her house today when around 4 PM she developed significant right-sided chest pain shortness of breath. Patient states she took an aspirin (325 mg) try to lay down to see if the pain would go away. She states the pain worsened and she began feeling nauseated as well so she came to the emergency department for evaluation. Patient denies any prior chest pain. Denies any recent illnesses. Patient denies any estrogen therapy or history of blood clots. Patient states significant right-sided chest pain as worse with any type of deep inspiration or cough. Patient states it makes her cough when she tries to take a deep breath.  Past Medical History:  Diagnosis Date  . 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 History of diverticulitis of colon   . Migraine headache   . Osteopenia   . seasonal rhinitis     Patient Active Problem List   Diagnosis Date Noted  . History of hematuria 11/16/2014  . Encounter for preventive health examination 11/16/2014  . Other chest pain 09/25/2013  . Bronchitis, chronic obstructive w acute bronchitis ([place]) 06/08/2013  . Cystitis, interstitial 02/21/2013  . Eustachian tube dysfunction, right 08/07/2011  . Lumbago without sciatica 02/26/2011  . Seizure disorder ([place])   . Migraine headache   . History of diverticulitis of colon   . Screening for colon cancer 02/23/2011  . Screening for cervical cancer 02/23/2011  . [place] DISEASE, CERVICAL 03/02/2007  . OSTEOPENIA 03/02/2007  . ALLERGIC RHINITIS 03/01/2007  . DIVERTICULOSIS, COLON 03/01/2007    Past Surgical History:  Procedure Laterality  Date  . BREAST BIOPSY Left 1997   Negative --- Pt states this procedure was a lumpect.  Marland Kitchen BREAST BIOPSY Right 2005   Negative  . BREAST SURGERY     lumpectomy, benign   . CARPAL TUNNEL RELEASE  1983   bilateral   . CERVICAL DISCECTOMY  August 2012   [place]    Prior to Admission medications   Medication Sig Start Date End Date Taking? Authorizing Provider  Cholecalciferol (VITAMIN D3) 1000 UNITS CAPS Take by mouth.    [provider]  ciprofloxacin (CIPRO) 500 MG tablet Take 1 tablet (500 mg total) by mouth 2 (two) times daily. 09/07/16   Caryn Section Linden Dolin, PA-C  cyanocobalamin (,VITAMIN B-12,) 1000 MCG/ML injection Inject 1 mL (1,000 mcg total) into the muscle once. I ml IM injection monthly 02/20/13   Crecencio Mc, MD  diazepam (VALIUM) 5 MG tablet TAKE 1 TABLET BY ORAL ROUTE EVERY 8 HOURS AS NEEDED 03/12/15   [provider]  dicyclomine (BENTYL) 10 MG capsule Take 1 capsule (10 mg total) by mouth 4 (four) times daily -  before meals and at bedtime. 04/23/15   Crecencio Mc, MD  metroNIDAZOLE (FLAGYL) 500 MG tablet Take 1 tablet (500 mg total) by mouth 3 (three) times daily. 09/07/16   Versie Starks, PA-C  Multiple Vitamin (MULTIVITAMIN) tablet Take 1 tablet by mouth daily.      [provider]  ondansetron (ZOFRAN-ODT) 8 MG disintegrating tablet TAKE 1 TABLET EVERY 6 HOURS AS NEEDED FOR NAUSEA AND VOMITING  03/24/14   Crecencio Mc, MD  PHENObarbital (LUMINAL) 60 MG tablet TAKE 1 TABLET BY MOUTH ONCE PER DAY 08/23/16   Crecencio Mc, MD  Probiotic Product (SOLUBLE FIBER/PROBIOTICS PO) Take 1 capsule by mouth daily.    [provider]  rizatriptan (MAXALT) 10 MG tablet Take 1 tablet (10 mg total) by mouth as needed for migraine. May repeat in 2 hours if needed 06/01/16   Crecencio Mc, MD  SPIRIVA HANDIHALER 18 MCG inhalation capsule PLACE 1 CAPSULE (18 MCG TOTAL) INTO INHALER AND INHALE DAILY. 03/30/16   Crecencio Mc, MD  SPIRIVA HANDIHALER 18 MCG  inhalation capsule PLACE 1 CAPSULE (18 MCG TOTAL) INTO INHALER AND INHALE DAILY. 10/06/16   Crecencio Mc, MD  tretinoin (RETIN-A) 0.025 % cream Apply 1 application topically as needed. 02/10/13   [provider]  zolpidem (AMBIEN) 10 MG tablet TAKE 1/2 TABLET BY MOUTH AT BEDTIME AS NEEDED 08/18/16   Crecencio Mc, MD    Allergies  Allergen Reactions  . Penicillins Other (See Comments)    Reaction occurred as a child; "mom described something that sounds like bronchospasms"    Family History  Problem Relation Age of Onset  . Heart disease Mother        Atrial fibrillation  . Cancer Maternal Grandmother        colon Ca  . Cancer Maternal Grandfather        Colon CA    Social History Social History  Substance Use Topics  . Smoking status: Never Smoker  . Smokeless tobacco: Never Used  . Alcohol use Yes    Review of Systems Constitutional: Low-grade fever in the emergency department Cardiovascular: Right-sided chest pain. Sharp, moderate in severity, worse with inspiration or cough. Respiratory: Positive for shortness of breath. Gastrointestinal: Negative for abdominal pain Genitourinary: Negative for dysuria. Musculoskeletal: Negative for leg pain or swelling. Neurological: Negative for headaches, focal weakness or numbness. All other ROS negative  ____________________________________________   PHYSICAL EXAM:  VITAL SIGNS: ED Triage Vitals  Enc Vitals Group     BP 10/06/16 2024 (!) 151/62     Pulse Rate 10/06/16 2024 (!) 119     Resp 10/06/16 2024 20     Temp 10/06/16 2024 100.2 F (37.9 C)     Temp Source 10/06/16 2024 Oral     SpO2 10/06/16 2024 96 %     Weight 10/06/16 2025 134 lb (60.8 kg)     Height 10/06/16 2025 5\' 7"  (1.702 m)     Head Circumference --      Peak Flow --      Pain Score 10/06/16 2024 10     Pain Loc --      Pain Edu? --      Excl. in [place]? --     Constitutional: Alert and oriented. Well appearing and in no distress. Eyes:  Normal exam ENT   Head: Normocephalic and atraumatic.   Mouth/Throat: Mucous membranes are moist. Cardiovascular: Regular rhythm, rate around 100 bpm. Respiratory: Normal respiratory effort without tachypnea nor retractions. Breath sounds are clear. Mild right-sided chest tenderness with palpation. Gastrointestinal: Soft and nontender. No distention.   Musculoskeletal: Nontender with normal range of motion in all extremities. No lower extremity tenderness or edema. Neurologic:  Normal speech and language. No gross focal neurologic deficits are appreciated. Skin:  Skin is warm, dry and intact.  Psychiatric: Mood and affect are normal. Speech and behavior are normal.   ____________________________________________  EKG  EKG reviewed and interpreted by myself shows sinus tachycardia 118 bpm, narrow QRS, normal axis, normal intervals. Patient does appear to have fairly diffuse ST depression most evident in the inferolateral leads. This is changed from prior.  ____________________________________________    RADIOLOGY  X-rays negative CTA consistent with multifocal pneumonia. Negative for PE ____________________________________________   INITIAL IMPRESSION / ASSESSMENT AND PLAN / ED COURSE  Pertinent labs & imaging results that were available during my care of the patient were reviewed by me and considered in my medical decision making (see chart for details).  Patient presents with sudden onset right-sided chest pain sharp in nature, pleuritic, worse with deep inspiration causing her to cough. Patient is EKG does show ST depressions in the inferolateral leads which is new compared to 2015. No leg pain or swelling. Nontender. No abdominal pain. Patient is in moderate discomfort much worse with deep inspiration. Low-grade fever in the emergency department. Given low-grade fever with tachycardia and pleuritic right-sided chest pain we obtain a CT angiography to evaluate for pulmonary  embolus. We will also obtain a repeat troponin as the patient's first set of labs is Junious Silk within normal limits including a negative troponin.  CT most consistent with multifocal pneumonia. No PE. However given the patient's EKG changes continued right-sided chest pain with multifocal pneumonia we will start on IV antibiotics, blood cultures have been sent. Patient is agreeable to admission to the hospital for further treatment.  ____________________________________________   FINAL CLINICAL IMPRESSION(S) / ED DIAGNOSES  Right-sided chest pain Multifocal pneumonia    Harvest Dark, MD 10/07/16 0116

## 2016-10-06 NOTE — ED Triage Notes (Signed)
Pt reports she has been cleaning all day today, at 430 she had a sudden onset of right sided chest pain, pt reports having chills , pt took 324 ASA, pt very anxious in triage

## 2016-10-07 ENCOUNTER — Encounter: Payer: Self-pay | Admitting: Radiology

## 2016-10-07 ENCOUNTER — Emergency Department: Payer: BLUE CROSS/BLUE SHIELD

## 2016-10-07 DIAGNOSIS — J189 Pneumonia, unspecified organism: Secondary | ICD-10-CM | POA: Diagnosis present

## 2016-10-07 DIAGNOSIS — Z79899 Other long term (current) drug therapy: Secondary | ICD-10-CM | POA: Diagnosis not present

## 2016-10-07 DIAGNOSIS — G40409 Other generalized epilepsy and epileptic syndromes, not intractable, without status epilepticus: Secondary | ICD-10-CM | POA: Diagnosis present

## 2016-10-07 DIAGNOSIS — Z880 Allergy status to penicillin: Secondary | ICD-10-CM | POA: Diagnosis not present

## 2016-10-07 DIAGNOSIS — R9431 Abnormal electrocardiogram [ECG] [EKG]: Secondary | ICD-10-CM | POA: Diagnosis present

## 2016-10-07 DIAGNOSIS — R079 Chest pain, unspecified: Secondary | ICD-10-CM | POA: Diagnosis present

## 2016-10-07 DIAGNOSIS — M8580 Other specified disorders of bone density and structure, unspecified site: Secondary | ICD-10-CM | POA: Diagnosis present

## 2016-10-07 DIAGNOSIS — E876 Hypokalemia: Secondary | ICD-10-CM | POA: Diagnosis present

## 2016-10-07 DIAGNOSIS — J440 Chronic obstructive pulmonary disease with acute lower respiratory infection: Secondary | ICD-10-CM | POA: Diagnosis present

## 2016-10-07 LAB — CBC
HEMATOCRIT: 36.3 % (ref 35.0–47.0)
HEMOGLOBIN: 12.4 g/dL (ref 12.0–16.0)
MCH: 31.8 pg (ref 26.0–34.0)
MCHC: 34.3 g/dL (ref 32.0–36.0)
MCV: 92.7 fL (ref 80.0–100.0)
Platelets: 202 10*3/uL (ref 150–440)
RBC: 3.91 MIL/uL (ref 3.80–5.20)
RDW: 13.2 % (ref 11.5–14.5)
WBC: 12.4 10*3/uL — AB (ref 3.6–11.0)

## 2016-10-07 LAB — TROPONIN I: Troponin I: <0.03 ng/mL (ref <0.03)

## 2016-10-07 LAB — BASIC METABOLIC PANEL
Anion gap: 8 (ref 5–15)
BUN: 13 mg/dL (ref 6–20)
CALCIUM: 8.9 mg/dL (ref 8.9–10.3)
CO2: 26 mmol/L (ref 22–32)
CREATININE: 0.84 mg/dL (ref 0.44–1.00)
Chloride: 102 mmol/L (ref 101–111)
GFR calc non Af Amer: >60 mL/min (ref >60)
Glucose, Bld: 113 mg/dL — ABNORMAL HIGH (ref 65–99)
Potassium: 3.3 mmol/L — ABNORMAL LOW (ref 3.5–5.1)
SODIUM: 136 mmol/L (ref 135–145)

## 2016-10-07 MED ORDER — ACETAMINOPHEN 650 MG RE SUPP: 650.0000 mg | Freq: Four times a day (QID) | RECTAL | Status: DC | PRN

## 2016-10-07 MED ORDER — RISAQUAD PO CAPS
1.0000 | ORAL_CAPSULE | Freq: Every day | ORAL | Status: DC
  Administered 2016-10-07: 1 via ORAL
  Filled 2016-10-07 (×2): qty 1

## 2016-10-07 MED ORDER — GUAIFENESIN ER 600 MG PO TB12
600.0000 mg | ORAL_TABLET | Freq: Two times a day (BID) | ORAL | Status: DC
  Administered 2016-10-07 – 2016-10-08 (×3): 600 mg via ORAL
  Filled 2016-10-07 (×3): qty 1

## 2016-10-07 MED ORDER — ZOLPIDEM TARTRATE 5 MG PO TABS: 5.0000 mg | ORAL_TABLET | Freq: Every evening | ORAL | Status: DC | PRN

## 2016-10-07 MED ORDER — MORPHINE SULFATE (PF) 2 MG/ML IV SOLN
2.0000 mg | INTRAVENOUS | Status: DC | PRN
  Administered 2016-10-07: 2 mg via INTRAVENOUS
  Filled 2016-10-07: qty 1

## 2016-10-07 MED ORDER — POTASSIUM CHLORIDE CRYS ER 20 MEQ PO TBCR
EXTENDED_RELEASE_TABLET | ORAL | Status: AC
  Filled 2016-10-07: qty 2

## 2016-10-07 MED ORDER — SOLUBLE FIBER/PROBIOTICS PO CHEW: CHEWABLE_TABLET | Freq: Every day | ORAL | Status: DC

## 2016-10-07 MED ORDER — PHENOBARBITAL 32.4 MG PO TABS
64.8000 mg | ORAL_TABLET | Freq: Every day | ORAL | Status: DC
  Administered 2016-10-07: 64.8 mg via ORAL
  Filled 2016-10-07: qty 2

## 2016-10-07 MED ORDER — DIAZEPAM 5 MG PO TABS: 5.0000 mg | ORAL_TABLET | Freq: Four times a day (QID) | ORAL | Status: DC | PRN

## 2016-10-07 MED ORDER — OXYCODONE HCL 5 MG PO TABS: 5.0000 mg | ORAL_TABLET | Freq: Four times a day (QID) | ORAL | Status: DC | PRN

## 2016-10-07 MED ORDER — DICYCLOMINE HCL 10 MG PO CAPS: 10.0000 mg | ORAL_CAPSULE | Freq: Three times a day (TID) | ORAL | Status: DC

## 2016-10-07 MED ORDER — SUMATRIPTAN SUCCINATE 50 MG PO TABS
50.0000 mg | ORAL_TABLET | ORAL | Status: DC | PRN
  Filled 2016-10-07: qty 1

## 2016-10-07 MED ORDER — ACETAMINOPHEN 325 MG PO TABS
650.0000 mg | ORAL_TABLET | Freq: Four times a day (QID) | ORAL | Status: DC | PRN
  Administered 2016-10-07 (×2): 650 mg via ORAL
  Filled 2016-10-07 (×2): qty 2

## 2016-10-07 MED ORDER — LEVOFLOXACIN IN D5W 750 MG/150ML IV SOLN
750.0000 mg | Freq: Once | INTRAVENOUS | Status: AC
  Administered 2016-10-07: 750 mg via INTRAVENOUS
  Filled 2016-10-07: qty 150

## 2016-10-07 MED ORDER — ENOXAPARIN SODIUM 40 MG/0.4ML ~~LOC~~ SOLN
40.0000 mg | SUBCUTANEOUS | Status: DC
  Filled 2016-10-07: qty 0.4

## 2016-10-07 MED ORDER — ONDANSETRON HCL 4 MG/2ML IJ SOLN: 4.0000 mg | Freq: Four times a day (QID) | INTRAMUSCULAR | Status: DC | PRN

## 2016-10-07 MED ORDER — IOPAMIDOL (ISOVUE-370) INJECTION 76%
75.0000 mL | Freq: Once | INTRAVENOUS | Status: AC | PRN
  Administered 2016-10-07: 75 mL via INTRAVENOUS

## 2016-10-07 MED ORDER — ONDANSETRON HCL 4 MG PO TABS: 4.0000 mg | ORAL_TABLET | Freq: Four times a day (QID) | ORAL | Status: DC | PRN

## 2016-10-07 MED ORDER — POTASSIUM CHLORIDE CRYS ER 20 MEQ PO TBCR
40.0000 meq | EXTENDED_RELEASE_TABLET | Freq: Once | ORAL | Status: AC
  Administered 2016-10-07: 40 meq via ORAL
  Filled 2016-10-07: qty 2

## 2016-10-07 MED ORDER — SENNOSIDES-DOCUSATE SODIUM 8.6-50 MG PO TABS: 1.0000 | ORAL_TABLET | Freq: Every evening | ORAL | Status: DC | PRN

## 2016-10-07 MED ORDER — DOCUSATE SODIUM 100 MG PO CAPS
200.0000 mg | ORAL_CAPSULE | Freq: Two times a day (BID) | ORAL | Status: DC
Start: 2016-10-07 — End: 2016-10-08
  Administered 2016-10-07: 200 mg via ORAL
  Filled 2016-10-07 (×3): qty 2

## 2016-10-07 MED ORDER — ADULT MULTIVITAMIN W/MINERALS CH
1.0000 | ORAL_TABLET | Freq: Every day | ORAL | Status: DC
  Administered 2016-10-07: 1 via ORAL
  Filled 2016-10-07 (×2): qty 1

## 2016-10-07 MED ORDER — PHENOBARBITAL 60 MG PO TABS
60.0000 mg | ORAL_TABLET | Freq: Every day | ORAL | Status: DC
  Filled 2016-10-07: qty 1

## 2016-10-07 MED ORDER — MORPHINE SULFATE (PF) 2 MG/ML IV SOLN: 2.0000 mg | INTRAVENOUS | Status: DC | PRN

## 2016-10-07 MED ORDER — POTASSIUM CHLORIDE CRYS ER 20 MEQ PO TBCR
40.0000 meq | EXTENDED_RELEASE_TABLET | Freq: Once | ORAL | Status: AC
  Administered 2016-10-07: 40 meq via ORAL

## 2016-10-07 MED ORDER — IBUPROFEN 400 MG PO TABS
400.0000 mg | ORAL_TABLET | Freq: Four times a day (QID) | ORAL | Status: DC | PRN
  Administered 2016-10-07 (×2): 400 mg via ORAL
  Filled 2016-10-07 (×2): qty 1

## 2016-10-07 MED ORDER — TIOTROPIUM BROMIDE MONOHYDRATE 18 MCG IN CAPS
18.0000 ug | ORAL_CAPSULE | Freq: Every day | RESPIRATORY_TRACT | Status: DC
  Administered 2016-10-08: 18 ug via RESPIRATORY_TRACT
  Filled 2016-10-07: qty 5

## 2016-10-07 MED ORDER — LEVOFLOXACIN IN D5W 750 MG/150ML IV SOLN
750.0000 mg | INTRAVENOUS | Status: DC
  Administered 2016-10-08: 01:00:00 750 mg via INTRAVENOUS
  Filled 2016-10-07 (×2): qty 150

## 2016-10-07 NOTE — Care Management (Signed)
No discharge needs identified by members of the care team at present time. Is not requiring oxygen

## 2016-10-07 NOTE — Plan of Care (Signed)
Problem: Pain Managment: Goal: General experience of comfort will improve Outcome: Not Progressing Continues to have rt pleuritic pain, prn medications  Problem: Education: Goal: Knowledge of the prescribed therapeutic regimen will improve Outcome: Not Progressing IV antibiotics

## 2016-10-07 NOTE — Progress Notes (Signed)
[place] at [facility]                                                                                                                                                                                  Patient Demographics   Barbara Odom, is a 66 y.o. female, DOB - January 16, 1951, UXN:[id number]  Admit date - 10/06/2016   Admitting Physician Saundra Shelling, MD  Outpatient Primary MD for the patient is Crecencio Mc, MD   LOS - 0  Subjective: C/o chest pain with deep breath    Review of Systems:   CONSTITUTIONAL: No documented fever. No fatigue, weakness. No weight gain, no weight loss.  EYES: No blurry or double vision.  ENT: No tinnitus. No postnasal drip. No redness of the oropharynx.  RESPIRATORY: No cough, no wheeze, no hemoptysis. No dyspnea. Positive chest pain with deep breathing CARDIOVASCULAR: No chest pain. No orthopnea. No palpitations. No syncope.  GASTROINTESTINAL: No nausea, no vomiting or diarrhea. No abdominal pain. No melena or hematochezia.  GENITOURINARY: No dysuria or hematuria.  ENDOCRINE: No polyuria or nocturia. No heat or cold intolerance.  HEMATOLOGY: No anemia. No bruising. No bleeding.  INTEGUMENTARY: No rashes. No lesions.  MUSCULOSKELETAL: No arthritis. No swelling. No gout.  NEUROLOGIC: No numbness, tingling, or ataxia. No seizure-type activity.  PSYCHIATRIC: No anxiety. No insomnia. No ADD.    Vitals:   Vitals:   10/07/16 0300 10/07/16 0506 10/07/16 0732 10/07/16 1316  BP:   (!) 123/50 135/64  Pulse:   79 78  Resp:    20  Temp: (!) 101.5 F (38.6 C) 99.3 F (37.4 C) 98.9 F (37.2 C)   TempSrc: Oral Oral Oral   SpO2:   97% 97%  Weight:  131 lb 3.2 oz (59.5 kg)    Height:        Wt Readings from Last 3 Encounters:  10/07/16 131 lb 3.2 oz (59.5 kg)  12/14/15 137 lb 8 oz (62.4 kg)  11/13/14 139 lb (63 kg)     Intake/Output Summary (Last 24 hours) at 10/07/16 1433 Last data filed at 10/07/16 1327  Gross per  24 hour  Intake              240 ml  Output             1210 ml  Net             -970 ml    Physical Exam:   GENERAL: Pleasant-appearing in no apparent distress.  HEAD, EYES, EARS, NOSE AND THROAT: Atraumatic, normocephalic. Extraocular muscles are intact. Pupils equal and reactive to light. Sclerae anicteric. No conjunctival injection. No oro-pharyngeal erythema.  NECK: Supple. There is no jugular venous distention. No bruits, no lymphadenopathy, no thyromegaly.  HEART: Regular rate and rhythm,. No murmurs, no rubs, no clicks.  LUNGS: Rhonchus breath sounds in the right lung  ABDOMEN: Soft, flat, nontender, nondistended. Has good bowel sounds. No hepatosplenomegaly appreciated.  EXTREMITIES: No evidence of any cyanosis, clubbing, or peripheral edema.  +2 pedal and radial pulses bilaterally.  NEUROLOGIC: The patient is alert, awake, and oriented x3 with no focal motor or sensory deficits appreciated bilaterally.  SKIN: Moist and warm with no rashes appreciated.  Psych: Not anxious, depressed LN: No inguinal LN enlargement    Antibiotics   Anti-infectives    Start     Dose/Rate Route Frequency Ordered Stop   10/08/16 0600  levofloxacin (LEVAQUIN) IVPB 750 mg     750 mg 100 mL/hr over 90 Minutes Intravenous Every 24 hours 10/07/16 0240     10/07/16 0100  levofloxacin (LEVAQUIN) IVPB 750 mg     750 mg 100 mL/hr over 90 Minutes Intravenous  Once 10/07/16 0045 10/07/16 0241      Medications   Scheduled Meds: . acidophilus  1 capsule Oral Daily  . docusate sodium  200 mg Oral BID  . enoxaparin (LOVENOX) injection  40 mg Subcutaneous Q24H  . guaiFENesin  600 mg Oral BID  . multivitamin with minerals  1 tablet Oral Daily  . PHENObarbital  60 mg Oral QHS  . tiotropium  18 mcg Inhalation Daily   Continuous Infusions: . [START ON 10/08/2016] levofloxacin (LEVAQUIN) IV     PRN Meds:.acetaminophen **OR** acetaminophen, diazepam, ibuprofen, morphine injection, ondansetron **OR**  ondansetron (ZOFRAN) IV, oxyCODONE, senna-docusate, SUMAtriptan, zolpidem   Data Review:   Micro Results Recent Results (from the past 240 hour(s))  Blood culture (routine x 2)     Status: None (Preliminary result)   Collection Time: 10/07/16 12:53 AM  Result Value Ref Range Status   Specimen Description BLOOD LT [facility]   Final   Special Requests   Final    BOTTLES DRAWN AEROBIC AND ANAEROBIC Blood Culture adequate volume   Culture NO GROWTH < 12 HOURS  Final   Report Status PENDING  Incomplete  Blood culture (routine x 2)     Status: None (Preliminary result)   Collection Time: 10/07/16 12:53 AM  Result Value Ref Range Status   Specimen Description BLOOD RT [facility]  Final   Special Requests   Final    BOTTLES DRAWN AEROBIC AND ANAEROBIC Blood Culture results may not be optimal due to an excessive volume of blood received in culture bottles   Culture NO GROWTH < 12 HOURS  Final   Report Status PENDING  Incomplete    Radiology Reports Dg Chest 2 View  Result Date: 10/06/2016 CLINICAL DATA:  Pt states at 430 she had a sudden onset of right sided chest pain, pt reports having chills, hx of bilat breast lumpectomy EXAM: CHEST  2 VIEW COMPARISON:  11/13/2014 FINDINGS: The cardiac silhouette is normal in size and configuration. No mediastinal or hilar masses or evidence of adenopathy. Lungs are hyperexpanded. Mild scarring noted in the anterior lung bases. Lungs otherwise clear. No pleural effusion or pneumothorax. Skeletal structures are intact. IMPRESSION: No acute cardiopulmonary disease. Hyperexpanded lungs consistent with COPD. Electronically Signed   By: Lajean Manes M.D.   On: 10/06/2016 20:46   Ct Angio Chest Pe W And/or Wo Contrast  Result Date: 10/07/2016 CLINICAL DATA:  Right-sided chest pain and chills. EXAM: CT ANGIOGRAPHY CHEST WITH CONTRAST TECHNIQUE:  Multidetector CT imaging of the chest was performed using the standard protocol during bolus administration of intravenous contrast.  Multiplanar CT image reconstructions and MIPs were obtained to evaluate the vascular anatomy. CONTRAST:  75 cc Isovue 370 IV COMPARISON:  None. FINDINGS: Cardiovascular: No pulmonary embolus. No aortic aneurysm or dissection. Minimal aortic atherosclerosis. Normal sized heart without pericardial effusion or thickening. Mediastinum/Nodes: Normal appearing thyroid gland. Normal branch pattern of the great vessels. No mediastinal or hilar lymphadenopathy. The trachea mainstem bronchi as well as esophagus are nonacute. Lungs/Pleura: Patchy pneumonic consolidations and/or atelectasis in the medial right middle lobe, lingula and infrahilar right lower lobe. Tiny acinar densities in the left upper lobe. Biapical pleuroparenchymal scarring. No effusion or pneumothorax. Upper Abdomen: No acute abnormality. Musculoskeletal: No chest wall abnormality. No acute or significant osseous findings. Review of the MIP images confirms the above findings. IMPRESSION: 1. Small pulmonary consolidations in the right middle lobe, infrahilar right lower lobe and lingula suspicious for pneumonia and/or atelectasis. 2. Tiny acinar densities in the left upper lobe may also represent postinfectious changes of a bronchopneumonia. 3. No acute pulmonary embolus. Aortic Atherosclerosis (ICD10-I70.0). Electronically Signed   By: Ashley Royalty M.D.   On: 10/07/2016 00:55     CBC  Recent Labs Lab 10/06/16 2025 10/07/16 0613  WBC 11.3* 12.4*  HGB 13.1 12.4  HCT 37.2 36.3  PLT 216 202  MCV 91.5 92.7  MCH 32.2 31.8  MCHC 35.2 34.3  RDW 13.3 13.2    Chemistries   Recent Labs Lab 10/06/16 2025 10/07/16 0613  NA 139 136  K 3.3* 3.3*  CL 102 102  CO2 26 26  GLUCOSE 116* 113*  BUN 14 13  CREATININE 0.82 0.84  CALCIUM 9.5 8.9   ------------------------------------------------------------------------------------------------------------------ estimated creatinine clearance is 63.6 mL/min (by C-G formula based on SCr of 0.84  mg/dL). ------------------------------------------------------------------------------------------------------------------ No results for input(s): HGBA1C in the last 72 hours. ------------------------------------------------------------------------------------------------------------------ No results for input(s): CHOL, HDL, LDLCALC, TRIG, CHOLHDL, LDLDIRECT in the last 72 hours. ------------------------------------------------------------------------------------------------------------------ No results for input(s): TSH, T4TOTAL, T3FREE, THYROIDAB in the last 72 hours.  Invalid input(s): FREET3 ------------------------------------------------------------------------------------------------------------------ No results for input(s): VITAMINB12, FOLATE, FERRITIN, TIBC, IRON, RETICCTPCT in the last 72 hours.  Coagulation profile No results for input(s): INR, PROTIME in the last 168 hours.  No results for input(s): DDIMER in the last 72 hours.  Cardiac Enzymes  Recent Labs Lab 10/07/16 0002 10/07/16 0613 10/07/16 1204  TROPONINI <0.03 <0.03 <0.03   ------------------------------------------------------------------------------------------------------------------ Invalid input(s): POCBNP    Assessment & Plan   1. Multilobar pneumonia Continue therapy with IV antibiotics follow blood cultures 2. Pleuritic chest pain secondary to pneumonia Will add anti-inflammatories to help with her symptoms 3. Hypokalemia replace her potassium 4. Seizure disorder continue medications at taking at home 5. Nonspecific T-wave inversion suspect this is due to her hypokalemia echocardiogram is ordered we'll follow-up on that     Code Status Orders        Start     Ordered   10/07/16 0309  Full code  Continuous     10/07/16 0308    Code Status History    Date Active Date Inactive Code Status Order ID Comments User Context   01/03/2014  9:29 AM 01/04/2014  3:30 AM Full Code [id number]  Marybelle Killings, MD HOV           Consults  None  DVT Prophylaxis  Lovenox   Lab Results  Component Value Date   PLT 202 10/07/2016  Time Spent in minutes   69min  Greater than 50% of time spent in care coordination and counseling patient regarding the condition and plan of care.   Dustin Flock M.D on 10/07/2016 at 2:33 PM  Between 7am to 6pm - Pager - [phone]  After 6pm go to [email or web address] - password [email or web address] [facility]   Office  [phone]

## 2016-10-07 NOTE — H&P (Signed)
[facility] [place] NAME: Barbara Odom    MR#:  [id number]  DATE OF BIRTH:  1950-09-11  DATE OF ADMISSION:  10/06/2016  PRIMARY CARE PHYSICIAN: Crecencio Mc, MD   REQUESTING/REFERRING PHYSICIAN:   CHIEF COMPLAINT:   Chief Complaint  Patient presents with  . Chest Pain    HISTORY OF PRESENT ILLNESS: Barbara Odom  is a 66 y.o. 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 The above complaints have been going on since yesterday. Patient has some chest discomfort whenever she coughs. Has some low-grade fever and difficulty breathing. Patient took aspirin at home prior to arrival. She also feels weak and tired for the last couple of days. Patient was cleaning her home with a bleaching powder as she is expecting her cousins from [place]. Patient was evaluated in the emergency room 2 sets of troponin were negative. She was worked up with CT angiogram of the chest which showed no pulmonary embolism, but multifocal bilateral pneumonia was noted. She received IV Levaquin antibiotic in the emergency room. Hospitalist service was consulted.  PAST MEDICAL HISTORY:   Past Medical History:  Diagnosis Date  . 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 History of diverticulitis of colon   . Migraine headache   . Osteopenia   . seasonal rhinitis     PAST SURGICAL HISTORY: Past Surgical History:  Procedure Laterality Date  . BREAST BIOPSY Left 1997   Negative --- Pt states this procedure was a lumpect.  [facility] BREAST BIOPSY Right 2005   Negative  . BREAST SURGERY     lumpectomy, benign   . CARPAL TUNNEL RELEASE  1983   bilateral   . CERVICAL DISCECTOMY  August 2012   [place]    SOCIAL HISTORY:  Social History  Substance Use Topics  . Smoking status: Never Smoker  . Smokeless tobacco: Never Used  .  Alcohol use Yes    FAMILY HISTORY:  Family History  Problem Relation Age of Onset  . Heart disease Mother        Atrial fibrillation  . Cancer Maternal Grandmother        colon Ca  . Cancer Maternal Grandfather        Colon CA    DRUG ALLERGIES:  Allergies  Allergen Reactions  . Penicillins Other (See Comments)    Reaction occurred as a child; "mom described something that sounds like bronchospasms"    REVIEW OF SYSTEMS:   CONSTITUTIONAL: Has fever, fatigue and weakness.  EYES: No blurred or double vision.  EARS, NOSE, AND THROAT: No tinnitus or ear pain.  RESPIRATORY: Has cough, shortness of breath,  No wheezing or hemoptysis.  CARDIOVASCULAR: Has chest pain,  No orthopnea, edema.  GASTROINTESTINAL: No nausea, vomiting, diarrhea or abdominal pain.  GENITOURINARY: No dysuria, hematuria.  ENDOCRINE: No polyuria, nocturia,  HEMATOLOGY: No anemia, easy bruising or bleeding SKIN: No rash or lesion. MUSCULOSKELETAL: No joint pain or arthritis.   NEUROLOGIC: No tingling, numbness, weakness.  PSYCHIATRY: No anxiety or depression.   MEDICATIONS AT HOME:  Prior to Admission medications   Medication Sig Start Date End Date Taking? Authorizing Provider  Cholecalciferol (VITAMIN D3) 1000 UNITS CAPS Take by mouth.   Yes [provider]  diazepam (VALIUM) 5 MG tablet TAKE 1 TABLET BY ORAL ROUTE EVERY 8 HOURS AS  NEEDED 03/12/15  Yes [provider]  dicyclomine (BENTYL) 10 MG capsule Take 10 mg by mouth 4 (four) times daily -  before meals and at bedtime.   Yes [provider]  Multiple Vitamin (MULTIVITAMIN) tablet Take 1 tablet by mouth daily.     Yes [provider]  PHENObarbital (LUMINAL) 60 MG tablet TAKE 1 TABLET BY MOUTH ONCE PER DAY 08/23/16  Yes Crecencio Mc, MD  Probiotic Product (SOLUBLE FIBER/PROBIOTICS PO) Take 1 capsule by mouth daily.   Yes [provider]  rizatriptan (MAXALT) 10 MG tablet Take 1 tablet (10 mg total) by  mouth as needed for migraine. May repeat in 2 hours if needed 06/01/16  Yes Crecencio Mc, MD  SPIRIVA HANDIHALER 18 MCG inhalation capsule PLACE 1 CAPSULE (18 MCG TOTAL) INTO INHALER AND INHALE DAILY. 10/06/16  Yes Crecencio Mc, MD  zolpidem (AMBIEN) 10 MG tablet TAKE 1/2 TABLET BY MOUTH AT BEDTIME AS NEEDED 08/18/16  Yes Crecencio Mc, MD      PHYSICAL EXAMINATION:   VITAL SIGNS: Blood pressure 121/65, pulse 88, temperature 100.2 F (37.9 C), temperature source Oral, resp. rate 15, height 5\' 7"  (1.702 m), weight 60.8 kg (134 lb), SpO2 99 %.  GENERAL:  66 y.o.-year-old patient lying in the bed with no acute distress.  EYES: Pupils equal, round, reactive to light and accommodation. No scleral icterus. Extraocular muscles intact.  HEENT: Head atraumatic, normocephalic. Oropharynx and nasopharynx clear.  NECK:  Supple, no jugular venous distention. No thyroid enlargement, no tenderness.  LUNGS: Decreased breath sounds bilaterally, bilateral rales heard in both lungs. No use of accessory muscles of respiration.  CARDIOVASCULAR: S1, S2 normal. No murmurs, rubs, or gallops.  ABDOMEN: Soft, nontender, nondistended. Bowel sounds present. No organomegaly or mass.  EXTREMITIES: No pedal edema, cyanosis, or clubbing.  NEUROLOGIC: Cranial nerves II through XII are intact. Muscle strength 5/5 in all extremities. Sensation intact. Gait not checked.  PSYCHIATRIC: The patient is alert and oriented x 3.  SKIN: No obvious rash, lesion, or ulcer.   LABORATORY PANEL:   CBC  Recent Labs Lab 10/06/16 2025  WBC 11.3*  HGB 13.1  HCT 37.2  PLT 216  MCV 91.5  MCH 32.2  MCHC 35.2  RDW 13.3   ------------------------------------------------------------------------------------------------------------------  Chemistries   Recent Labs Lab 10/06/16 2025  NA 139  K 3.3*  CL 102  CO2 26  GLUCOSE 116*  BUN 14  CREATININE 0.82  CALCIUM 9.5    ------------------------------------------------------------------------------------------------------------------ estimated creatinine clearance is 66.5 mL/min (by C-G formula based on SCr of 0.82 mg/dL). ------------------------------------------------------------------------------------------------------------------ No results for input(s): TSH, T4TOTAL, T3FREE, THYROIDAB in the last 72 hours.  Invalid input(s): FREET3   Coagulation profile No results for input(s): INR, PROTIME in the last 168 hours. ------------------------------------------------------------------------------------------------------------------- No results for input(s): DDIMER in the last 72 hours. -------------------------------------------------------------------------------------------------------------------  Cardiac Enzymes  Recent Labs Lab 10/06/16 2025 10/07/16 0002  TROPONINI <0.03 <0.03   ------------------------------------------------------------------------------------------------------------------ Invalid input(s): POCBNP  ---------------------------------------------------------------------------------------------------------------  Urinalysis    Component Value Date/Time   BILIRUBINUR neg 04/29/2015 1355   PROTEINUR neg 04/29/2015 1355   UROBILINOGEN 0.2 04/29/2015 1355   NITRITE neg 04/29/2015 1355   LEUKOCYTESUR Negative 04/29/2015 1355     RADIOLOGY: Dg Chest 2 View  Result Date: 10/06/2016 CLINICAL DATA:  Pt states at 430 she had a sudden onset of right sided chest pain, pt reports having chills, hx of bilat breast lumpectomy EXAM: CHEST  2 VIEW COMPARISON:  11/13/2014 FINDINGS: The cardiac  silhouette is normal in size and configuration. No mediastinal or hilar masses or evidence of adenopathy. Lungs are hyperexpanded. Mild scarring noted in the anterior lung bases. Lungs otherwise clear. No pleural effusion or pneumothorax. Skeletal structures are intact. IMPRESSION: No acute  cardiopulmonary disease. Hyperexpanded lungs consistent with COPD. Electronically Signed   By: Lajean Manes M.D.   On: 10/06/2016 20:46   Ct Angio Chest Pe W And/or Wo Contrast  Result Date: 10/07/2016 CLINICAL DATA:  Right-sided chest pain and chills. EXAM: CT ANGIOGRAPHY CHEST WITH CONTRAST TECHNIQUE: Multidetector CT imaging of the chest was performed using the standard protocol during bolus administration of intravenous contrast. Multiplanar CT image reconstructions and MIPs were obtained to evaluate the vascular anatomy. CONTRAST:  75 cc Isovue 370 IV COMPARISON:  None. FINDINGS: Cardiovascular: No pulmonary embolus. No aortic aneurysm or dissection. Minimal aortic atherosclerosis. Normal sized heart without pericardial effusion or thickening. Mediastinum/Nodes: Normal appearing thyroid gland. Normal branch pattern of the great vessels. No mediastinal or hilar lymphadenopathy. The trachea mainstem bronchi as well as esophagus are nonacute. Lungs/Pleura: Patchy pneumonic consolidations and/or atelectasis in the medial right middle lobe, lingula and infrahilar right lower lobe. Tiny acinar densities in the left upper lobe. Biapical pleuroparenchymal scarring. No effusion or pneumothorax. Upper Abdomen: No acute abnormality. Musculoskeletal: No chest wall abnormality. No acute or significant osseous findings. Review of the MIP images confirms the above findings. IMPRESSION: 1. Small pulmonary consolidations in the right middle lobe, infrahilar right lower lobe and lingula suspicious for pneumonia and/or atelectasis. 2. Tiny acinar densities in the left upper lobe may also represent postinfectious changes of a bronchopneumonia. 3. No acute pulmonary embolus. Aortic Atherosclerosis (ICD10-I70.0). Electronically Signed   By: Ashley Royalty M.D.   On: 10/07/2016 00:55    EKG: Orders placed or performed during the hospital encounter of 10/06/16  . EKG 12-Lead  . EKG 12-Lead  . ED EKG within 10 minutes  . ED  EKG within 10 minutes    IMPRESSION AND PLAN: 66 year old female patient with history of grand mal seizure, migraine headache, seasonal rhinitis presented to the emergency room with cough, shortness of breath and chest discomfort. Admitting diagnosis 1. Multilobar pneumonia 2. Pleuritic chest pain secondary to pneumonia 3. Hypokalemia 4. Seizure disorder Treatment plan Admit patient to medical floor Resume phenobarbital for seizure disorder Start patient on IV Levaquin antibiotic 500 MG daily Follow-up cultures Replace potassium  All the records are reviewed and case discussed with ED provider. Management plans discussed with the patient, family and they are in agreement.  CODE STATUS:FULL CODE Code Status History    Date Active Date Inactive Code Status Order ID Comments User Context   01/03/2014  9:29 AM 01/04/2014  3:30 AM Full Code [id number]  Marybelle Killings, MD HOV       TOTAL TIME TAKING CARE OF THIS PATIENT: 52 minutes.    Saundra Shelling M.D on 10/07/2016 at 2:58 AM  Between 7am to 6pm - Pager - [phone]  After 6pm go to [email or web address] - password [email or web address] [facility]  Office  [phone]  CC: Primary care physician; Crecencio Mc, MD

## 2016-10-07 NOTE — Progress Notes (Signed)
Pharmacy Antibiotic Note  BRISELDA NAVAL is a 66 y.o. female admitted on 10/06/2016 with pneumonia.  Pharmacy has been consulted for levaquin dosing.  Plan: Will start levaquin 750 mg IV daily  QTc 437 WNL  Height: 5\' 7"  (170.2 cm) Weight: 134 lb (60.8 kg) IBW/kg (Calculated) : 61.6  Temp (24hrs), Avg:100.2 F (37.9 C), Min:100.2 F (37.9 C), Max:100.2 F (37.9 C)   Recent Labs Lab 10/06/16 2025  WBC 11.3*  CREATININE 0.82    Estimated Creatinine Clearance: 66.5 mL/min (by C-G formula based on SCr of 0.82 mg/dL).    Allergies  Allergen Reactions  . Penicillins Other (See Comments)    Reaction occurred as a child; "mom described something that sounds like bronchospasms"     Thank you for allowing pharmacy to be a part of this patient's care.  Tobie Lords, PharmD, BCPS Clinical Pharmacist 10/07/2016

## 2016-10-07 NOTE — Progress Notes (Signed)
New orders Given to transfer patient to 1A to make availability for cardiac patients. Barbara Odom

## 2016-10-08 ENCOUNTER — Inpatient Hospital Stay: Admit: 2016-10-08 | Payer: BLUE CROSS/BLUE SHIELD

## 2016-10-08 DIAGNOSIS — E876 Hypokalemia: Secondary | ICD-10-CM

## 2016-10-08 DIAGNOSIS — R9431 Abnormal electrocardiogram [ECG] [EKG]: Secondary | ICD-10-CM

## 2016-10-08 DIAGNOSIS — R0789 Other chest pain: Secondary | ICD-10-CM

## 2016-10-08 DIAGNOSIS — D72829 Elevated white blood cell count, unspecified: Secondary | ICD-10-CM

## 2016-10-08 LAB — CBC
HCT: 38.4 % (ref 35.0–47.0)
HEMOGLOBIN: 13.4 g/dL (ref 12.0–16.0)
MCH: 33.2 pg (ref 26.0–34.0)
MCHC: 34.9 g/dL (ref 32.0–36.0)
MCV: 95 fL (ref 80.0–100.0)
Platelets: 201 10*3/uL (ref 150–440)
RBC: 4.04 MIL/uL (ref 3.80–5.20)
RDW: 13.6 % (ref 11.5–14.5)
WBC: 9.4 10*3/uL (ref 3.6–11.0)

## 2016-10-08 LAB — BASIC METABOLIC PANEL
Anion gap: 6 (ref 5–15)
BUN: 9 mg/dL (ref 6–20)
CO2: 26 mmol/L (ref 22–32)
Calcium: 9.2 mg/dL (ref 8.9–10.3)
Chloride: 106 mmol/L (ref 101–111)
Creatinine, Ser: 0.67 mg/dL (ref 0.44–1.00)
GFR calc Af Amer: >60 mL/min (ref >60)
GFR calc non Af Amer: >60 mL/min (ref >60)
GLUCOSE: 109 mg/dL — AB (ref 65–99)
POTASSIUM: 3.9 mmol/L (ref 3.5–5.1)
Sodium: 138 mmol/L (ref 135–145)

## 2016-10-08 MED ORDER — LEVOFLOXACIN 750 MG PO TABS: 750.0000 mg | ORAL_TABLET | ORAL | Status: DC

## 2016-10-08 MED ORDER — LEVOFLOXACIN 750 MG PO TABS: 750.0000 mg | ORAL_TABLET | Freq: Every day | ORAL | 0 refills | Status: AC

## 2016-10-08 MED ORDER — GUAIFENESIN ER 600 MG PO TB12: 600.0000 mg | ORAL_TABLET | Freq: Two times a day (BID) | ORAL | 0 refills | Status: DC

## 2016-10-08 MED ORDER — ASPIRIN EC 81 MG PO TBEC: 81.0000 mg | DELAYED_RELEASE_TABLET | Freq: Every day | ORAL | 0 refills | Status: DC

## 2016-10-08 NOTE — Discharge Summary (Signed)
[facility] [place] NAME: Barbara Odom    MR#:  [id number]  DATE OF BIRTH:  1950/09/12  DATE OF ADMISSION:  10/06/2016 ADMITTING PHYSICIAN: Saundra Shelling, MD  DATE OF DISCHARGE: No discharge date for patient encounter.  PRIMARY CARE PHYSICIAN: Crecencio Mc, MD     ADMISSION DIAGNOSIS:  Right-sided chest pain [R07.9] Multifocal pneumonia [J18.9]  DISCHARGE DIAGNOSIS:  Active Problems:   Pneumonia   Chest tightness   Abnormal EKG   Hypokalemia   Leukocytosis   SECONDARY DIAGNOSIS:   Past Medical History:  Diagnosis Date  . 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 History of diverticulitis of colon   . Migraine headache   . Osteopenia   . seasonal rhinitis     .pro HOSPITAL COURSE:   The patient is 66 year old Caucasian female with history of seasonal rhinitis, migraine headaches, seizures, who presented to the hospital with complaints of dry cough, chest discomfort, which started 1 day before admission. She was evaluated in the emergency room with 2 sets of troponins, which were negative. She had CT angiogram of the chest which revealed no pulmonary embolism, but multifocal bilateral pneumonia was noted. Labs revealed mild leukocytosis. She was initiated on levofloxacin and her white blood cell count normalized. Her chest discomfort also abated. Patient's EKG on admission revealed ST depressions in lateral leads, echocardiogram was recommended, however, patient refused to have it inpatient, she was recommended to follow-up with her primary care physician and have echocardiogram done as outpatient. She was meanwhile recommended to continue aspirin therapy. Discussion by problem: #1. Pneumonia, patient is to continue antibiotic therapy to complete course #2. Chest tightness, likely due to pneumonia, however, cannot rule out underlying coronary artery disease, patient was advised to continue aspirin  therapy until evaluated by Myoview stress test as outpatient #3. Abnormal EKG, as above, repeat EKG showed a normal ST-T segment, it is recommended to have mildly stress test as outpatient, patient was advised to continue aspirin therapy until stress test is done #4. Hypokalemia, supplemented, resolved.  #5. Leukocytosis, resolved with levofloxacin   DISCHARGE CONDITIONS:   Stable  CONSULTS OBTAINED:    DRUG ALLERGIES:   Allergies  Allergen Reactions  . Penicillins Other (See Comments)    Reaction occurred as a child; "mom described something that sounds like bronchospasms"    DISCHARGE MEDICATIONS:   Current Discharge Medication List    START taking these medications   Details  aspirin EC 81 MG tablet Take 1 tablet (81 mg total) by mouth daily. Qty: 30 tablet, Refills: 0    guaiFENesin (MUCINEX) 600 MG 12 hr tablet Take 1 tablet (600 mg total) by mouth 2 (two) times daily. Qty: 20 tablet, Refills: 0    levofloxacin (LEVAQUIN) 750 MG tablet Take 1 tablet (750 mg total) by mouth daily. Qty: 3 tablet, Refills: 0      CONTINUE these medications which have NOT CHANGED   Details  Cholecalciferol (VITAMIN D3) 1000 UNITS CAPS Take by mouth.    diazepam (VALIUM) 5 MG tablet TAKE 1 TABLET BY ORAL ROUTE EVERY 8 HOURS AS NEEDED Refills: 1    dicyclomine (BENTYL) 10 MG capsule Take 10 mg by mouth 4 (four) times daily -  before meals and at bedtime.    Multiple Vitamin (MULTIVITAMIN) tablet Take 1 tablet by mouth daily.      PHENObarbital (LUMINAL) 60 MG tablet TAKE 1 TABLET BY MOUTH ONCE PER  DAY Qty: 7 tablet, Refills: 0    Probiotic Product (SOLUBLE FIBER/PROBIOTICS PO) Take 1 capsule by mouth daily.    rizatriptan (MAXALT) 10 MG tablet Take 1 tablet (10 mg total) by mouth as needed for migraine. May repeat in 2 hours if needed Qty: 30 tablet, Refills: 3    SPIRIVA HANDIHALER 18 MCG inhalation capsule PLACE 1 CAPSULE (18 MCG TOTAL) INTO INHALER AND INHALE DAILY. Qty: 30  capsule, Refills: 0    zolpidem (AMBIEN) 10 MG tablet TAKE 1/2 TABLET BY MOUTH AT BEDTIME AS NEEDED Qty: 30 tablet, Refills: 0   Associated Diagnoses: Insomnia         DISCHARGE INSTRUCTIONS:    The patient is to follow-up with her primary care physician within one week after discharge  If you experience worsening of your admission symptoms, develop shortness of breath, life threatening emergency, suicidal or homicidal thoughts you must seek medical attention immediately by calling 911 or calling your MD immediately  if symptoms less severe.  You Must read complete instructions/literature along with all the possible adverse reactions/side effects for all the Medicines you take and that have been prescribed to you. Take any new Medicines after you have completely understood and accept all the possible adverse reactions/side effects.   Please note  You were cared for by a hospitalist during your hospital stay. If you have any questions about your discharge medications or the care you received while you were in the hospital after you are discharged, you can call the unit and asked to speak with the hospitalist on call if the hospitalist that took care of you is not available. Once you are discharged, your primary care physician will handle any further medical issues. Please note that NO REFILLS for any discharge medications will be authorized once you are discharged, as it is imperative that you return to your primary care physician (or establish a relationship with a primary care physician if you do not have one) for your aftercare needs so that they can reassess your need for medications and monitor your lab values.    Today   CHIEF COMPLAINT:   Chief Complaint  Patient presents with  . Chest Pain    HISTORY OF PRESENT ILLNESS:    VITAL SIGNS:  Blood pressure (!) 133/57, pulse 75, temperature 98 F (36.7 C), temperature source Oral, resp. rate 20, height 5\' 7"  (1.702 m), weight  59.5 kg (131 lb 3.2 oz), SpO2 99 %.  I/O:   Intake/Output Summary (Last 24 hours) at 10/08/16 1339 Last data filed at 10/08/16 1028  Gross per 24 hour  Intake              630 ml  Output              500 ml  Net              130 ml    PHYSICAL EXAMINATION:  GENERAL:  66 y.o.-year-old patient lying in the bed with no acute distress.  EYES: Pupils equal, round, reactive to light and accommodation. No scleral icterus. Extraocular muscles intact.  HEENT: Head atraumatic, normocephalic. Oropharynx and nasopharynx clear.  NECK:  Supple, no jugular venous distention. No thyroid enlargement, no tenderness.  LUNGS: Normal breath sounds bilaterally, no wheezing, rales,rhonchi or crepitation. No use of accessory muscles of respiration.  CARDIOVASCULAR: S1, S2 normal. No murmurs, rubs, or gallops.  ABDOMEN: Soft, non-tender, non-distended. Bowel sounds present. No organomegaly or mass.  EXTREMITIES: No pedal edema, cyanosis, or  clubbing.  NEUROLOGIC: Cranial nerves II through XII are intact. Muscle strength 5/5 in all extremities. Sensation intact. Gait not checked.  PSYCHIATRIC: The patient is alert and oriented x 3.  SKIN: No obvious rash, lesion, or ulcer.   DATA REVIEW:   CBC  Recent Labs Lab 10/08/16 0607  WBC 9.4  HGB 13.4  HCT 38.4  PLT 201    Chemistries   Recent Labs Lab 10/08/16 0607  NA 138  K 3.9  CL 106  CO2 26  GLUCOSE 109*  BUN 9  CREATININE 0.67  CALCIUM 9.2    Cardiac Enzymes  Recent Labs Lab 10/07/16 1204  [place] <0.03    Microbiology Results  Results for orders placed or performed during the hospital encounter of 10/06/16  Blood culture (routine x 2)     Status: None (Preliminary result)   Collection Time: 10/07/16 12:53 AM  Result Value Ref Range Status   Specimen Description BLOOD LT [facility]   Final   Special Requests   Final    BOTTLES DRAWN AEROBIC AND ANAEROBIC Blood Culture adequate volume   Culture NO GROWTH 1 DAY  Final   Report  Status PENDING  Incomplete  Blood culture (routine x 2)     Status: None (Preliminary result)   Collection Time: 10/07/16 12:53 AM  Result Value Ref Range Status   Specimen Description BLOOD RT [facility]  Final   Special Requests   Final    BOTTLES DRAWN AEROBIC AND ANAEROBIC Blood Culture results may not be optimal due to an excessive volume of blood received in culture bottles   Culture NO GROWTH 1 DAY  Final   Report Status PENDING  Incomplete    RADIOLOGY:  Dg Chest 2 View  Result Date: 10/06/2016 CLINICAL DATA:  Pt states at 430 she had a sudden onset of right sided chest pain, pt reports having chills, hx of bilat breast lumpectomy EXAM: CHEST  2 VIEW COMPARISON:  11/13/2014 FINDINGS: The cardiac silhouette is normal in size and configuration. No mediastinal or hilar masses or evidence of adenopathy. Lungs are hyperexpanded. Mild scarring noted in the anterior lung bases. Lungs otherwise clear. No pleural effusion or pneumothorax. Skeletal structures are intact. IMPRESSION: No acute cardiopulmonary disease. Hyperexpanded lungs consistent with COPD. Electronically Signed   By: Lajean Manes M.D.   On: 10/06/2016 20:46   Ct Angio Chest Pe W And/or Wo Contrast  Result Date: 10/07/2016 CLINICAL DATA:  Right-sided chest pain and chills. EXAM: CT ANGIOGRAPHY CHEST WITH CONTRAST TECHNIQUE: Multidetector CT imaging of the chest was performed using the standard protocol during bolus administration of intravenous contrast. Multiplanar CT image reconstructions and MIPs were obtained to evaluate the vascular anatomy. CONTRAST:  75 cc Isovue 370 IV COMPARISON:  None. FINDINGS: Cardiovascular: No pulmonary embolus. No aortic aneurysm or dissection. Minimal aortic atherosclerosis. Normal sized heart without pericardial effusion or thickening. Mediastinum/Nodes: Normal appearing thyroid gland. Normal branch pattern of the great vessels. No mediastinal or hilar lymphadenopathy. The trachea mainstem bronchi as well  as esophagus are nonacute. Lungs/Pleura: Patchy pneumonic consolidations and/or atelectasis in the medial right middle lobe, lingula and infrahilar right lower lobe. Tiny acinar densities in the left upper lobe. Biapical pleuroparenchymal scarring. No effusion or pneumothorax. Upper Abdomen: No acute abnormality. Musculoskeletal: No chest wall abnormality. No acute or significant osseous findings. Review of the MIP images confirms the above findings. IMPRESSION: 1. Small pulmonary consolidations in the right middle lobe, infrahilar right lower lobe and lingula suspicious for pneumonia and/or atelectasis.  2. Tiny acinar densities in the left upper lobe may also represent postinfectious changes of a bronchopneumonia. 3. No acute pulmonary embolus. Aortic Atherosclerosis (ICD10-I70.0). Electronically Signed   By: Ashley Royalty M.D.   On: 10/07/2016 00:55    EKG:   Orders placed or performed during the hospital encounter of 10/06/16  . EKG 12-Lead  . EKG 12-Lead  . ED EKG within 10 minutes  . ED EKG within 10 minutes  . EKG 12-Lead  . EKG 12-Lead      Management plans discussed with the patient, family and they are in agreement.  CODE STATUS:     Code Status Orders        Start     Ordered   10/07/16 0309  Full code  Continuous     10/07/16 0308    Code Status History    Date Active Date Inactive Code Status Order ID Comments User Context   01/03/2014  9:29 AM 01/04/2014  3:30 AM Full Code [id number]  Marybelle Killings, MD HOV      TOTAL TIME TAKING CARE OF THIS PATIENT: 40  minutes.    Theodoro Grist M.D on 10/08/2016 at 1:39 PM  Between 7am to 6pm - Pager - [phone]  After 6pm go to [email or web address] - password [email or web address] [facility]  Office  [phone]  CC: Primary care physician; Crecencio Mc, MD

## 2016-10-08 NOTE — Progress Notes (Signed)
x

## 2016-10-08 NOTE — Progress Notes (Signed)
PHARMACIST - PHYSICIAN COMMUNICATION  CONCERNING: Antibiotic IV to Oral Route Change Policy  RECOMMENDATION: This patient is receiving levofloxacin by the intravenous route.  Based on criteria approved by the Pharmacy and Therapeutics Committee, the antibiotic(s) is/are being converted to the equivalent oral dose form(s).   DESCRIPTION: These criteria include:  Patient being treated for a respiratory tract infection, urinary tract infection, cellulitis or clostridium difficile associated diarrhea if on metronidazole  The patient is not neutropenic and does not exhibit a GI malabsorption state  The patient is eating (either orally or via tube) and/or has been taking other orally administered medications for a least 24 hours  The patient is improving clinically and has a Tmax < 100.5  If you have questions about this conversion, please contact the Pharmacy Department  []  ( [phone] )  [place] []  ( [phone]  [facility] []  ( [phone]  [place] []  ( [phone] )  [facility] []  ( [phone] )  [facility]   

## 2016-10-08 NOTE — Progress Notes (Signed)
MD order received in [facility] to discharge pt home today; verbally reviewed AVS with pt, no questions voiced at this time; pt's spouse present for discharge, pt refused wheelchair, ambulated to the visitor's entrance for discharge

## 2016-10-11 ENCOUNTER — Telehealth: Payer: Self-pay | Admitting: Internal Medicine

## 2016-10-11 NOTE — Telephone Encounter (Signed)
Please advise 

## 2016-10-11 NOTE — Telephone Encounter (Signed)
Transition Care Management Follow-up Telephone Call  How have you been since you were released from the hospital? Patient stated she feels better , but knows she has just been over doing it since discharge. Still having chest soreness. Patient stated she is not really coughing anything up that she not coughing much ,but when she does its dry and non productive.   Do you understand why you were in the hospital? Yes   Do you understand the discharge instrcutions? Yes  Items Reviewed:  Medications reviewed: Yes completes Levaquin for pneumonia today. Taking guaifenesin 600 mg BID.   Allergies reviewed: Yes  Dietary changes reviewed: Yes  Referrals reviewed: Yes   Functional Questionnaire:   Activities of Daily Living (ADLs):   She states they are independent in the following: All ADLS States they require assistance with the following:No assist needed at this time.        Any transportation issues/concerns?: No   Any patient concerns? No   Confirmed importance and date/time of follow-up visits scheduled: Yes   Confirmed with patient if condition begins to worsen call PCP or go to the ER.  Patient was given the Call-a-Nurse line [phone]: Yes

## 2016-10-11 NOTE — Telephone Encounter (Signed)
HFU, Dx was Multi lope Pneumonia and chest pain. No appt avail to sch. Pt was discharged on Saturday afternoon. Pt was admitted on Thursday at [facility]. Please advise?  Call pt @ [phone]. Thank you!

## 2016-10-12 LAB — CULTURE, BLOOD (ROUTINE X 2)
Culture: NO GROWTH
Culture: NO GROWTH
SPECIAL REQUESTS: ADEQUATE

## 2016-10-13 LAB — HIV ANTIBODY (ROUTINE TESTING W REFLEX): HIV SCREEN 4TH GENERATION: NONREACTIVE

## 2016-10-19 ENCOUNTER — Ambulatory Visit (INDEPENDENT_AMBULATORY_CARE_PROVIDER_SITE_OTHER): Payer: BLUE CROSS/BLUE SHIELD | Admitting: Internal Medicine

## 2016-10-19 ENCOUNTER — Encounter: Payer: Self-pay | Admitting: Internal Medicine

## 2016-10-19 VITALS — BP 130/78 | HR 71 | Temp 98.9°F | Resp 16 | Ht 67.0 in | Wt 133.2 lb

## 2016-10-19 DIAGNOSIS — J189 Pneumonia, unspecified organism: Secondary | ICD-10-CM | POA: Diagnosis not present

## 2016-10-19 DIAGNOSIS — R9431 Abnormal electrocardiogram [ECG] [EKG]: Secondary | ICD-10-CM | POA: Diagnosis not present

## 2016-10-19 DIAGNOSIS — D72829 Elevated white blood cell count, unspecified: Secondary | ICD-10-CM | POA: Diagnosis not present

## 2016-10-19 DIAGNOSIS — Z09 Encounter for follow-up examination after completed treatment for conditions other than malignant neoplasm: Secondary | ICD-10-CM

## 2016-10-19 MED ORDER — PREDNISONE 10 MG PO TABS: ORAL_TABLET | ORAL | 0 refills | Status: DC

## 2016-10-19 MED ORDER — AZITHROMYCIN 250 MG PO TABS: ORAL_TABLET | ORAL | 0 refills | Status: DC

## 2016-10-19 MED ORDER — FLUCONAZOLE 150 MG PO TABS: 150.0000 mg | ORAL_TABLET | Freq: Every day | ORAL | 0 refills | Status: DC

## 2016-10-19 NOTE — Progress Notes (Signed)
Subjective:  Patient ID: Barbara Odom, female    DOB: 1950/11/06  Age: 66 y.o. MRN: [id number]  CC: The primary encounter diagnosis was Abnormal EKG. Diagnoses of Leukocytosis, unspecified type, Pneumonia due to infectious organism, unspecified laterality, unspecified part of lung, and Hospital discharge follow-up were also pertinent to this visit.  HPI Barbara Odom presents for hospital follow up.  Patient was admitted to [facility] on July 19 with chest pain , shortness of breath and nonproductive cough. CT angiogram was negative for PE but positive for multifocal pneumonia .  Admitting diagnosis was community acquired pneumonia .  She ruled out for AMI , but her EKG noted ST depression in lateral leads.  Inpatient workup was  deferred by patient .  Patient was discharged on July 21 with 3 additional days of Levaquin prescrived.   Still having productive  cough,  Right sided chest pain , but no fevers or shortness of breath.  Taking hemp oil with fatty acids  1000 mg or back pain     Outpatient Medications Prior to Visit  Medication Sig Dispense Refill  . aspirin EC 81 MG tablet Take 1 tablet (81 mg total) by mouth daily. 30 tablet 0  . Cholecalciferol (VITAMIN D3) 1000 UNITS CAPS Take by mouth.    . diazepam (VALIUM) 5 MG tablet TAKE 1 TABLET BY ORAL ROUTE EVERY 8 HOURS AS NEEDED  1  . dicyclomine (BENTYL) 10 MG capsule Take 10 mg by mouth 4 (four) times daily -  before meals and at bedtime.    . Multiple Vitamin (MULTIVITAMIN) tablet Take 1 tablet by mouth daily.      Marland Kitchen PHENObarbital (LUMINAL) 60 MG tablet TAKE 1 TABLET BY MOUTH ONCE PER DAY 7 tablet 0  . Probiotic Product (SOLUBLE FIBER/PROBIOTICS PO) Take 1 capsule by mouth daily.    . rizatriptan (MAXALT) 10 MG tablet Take 1 tablet (10 mg total) by mouth as needed for migraine. May repeat in 2 hours if needed 30 tablet 3  . SPIRIVA HANDIHALER 18 MCG inhalation capsule PLACE 1 CAPSULE (18 MCG TOTAL) INTO INHALER AND INHALE DAILY.  30 capsule 0  . zolpidem (AMBIEN) 10 MG tablet TAKE 1/2 TABLET BY MOUTH AT BEDTIME AS NEEDED 30 tablet 0  . guaiFENesin (MUCINEX) 600 MG 12 hr tablet Take 1 tablet (600 mg total) by mouth 2 (two) times daily. (Patient not taking: Reported on 10/19/2016) 20 tablet 0   No facility-administered medications prior to visit.     Review of Systems;  Patient denies headache, fevers, malaise, unintentional weight loss, skin rash, eye pain, sinus congestion and sinus pain, sore throat, dysphagia,  hemoptysis , cough, dyspnea, wheezing, chest pain, palpitations, orthopnea, edema, abdominal pain, nausea, melena, diarrhea, constipation, flank pain, dysuria, hematuria, urinary  Frequency, nocturia, numbness, tingling, seizures,  Focal weakness, Loss of consciousness,  Tremor, insomnia, depression, anxiety, and suicidal ideation.      Objective:  BP 130/78 (BP Location: Left Arm, Patient Position: Sitting, Cuff Size: Normal)   Pulse 71   Temp 98.9 F (37.2 C) (Oral)   Resp 16   Ht 5\' 7"  (1.702 m)   Wt 133 lb 3.2 oz (60.4 kg)   SpO2 98%   BMI 20.86 kg/m   BP Readings from Last 3 Encounters:  10/19/16 130/78  10/08/16 (!) 133/57  09/07/16 115/70    Wt Readings from Last 3 Encounters:  10/19/16 133 lb 3.2 oz (60.4 kg)  10/07/16 131 lb 3.2 oz (59.5 kg)  12/14/15  137 lb 8 oz (62.4 kg)    General appearance: alert, cooperative and appears stated age Ears: normal TM's and external ear canals both ears Throat: lips, mucosa, and tongue normal; teeth and gums normal Neck: no adenopathy, no carotid bruit, supple, symmetrical, trachea midline and thyroid not enlarged, symmetric, no tenderness/mass/nodules Back: symmetric, no curvature. ROM normal. No CVA tenderness. Lungs: clear to auscultation bilaterally Heart: regular rate and rhythm, S1, S2 normal, no murmur, click, rub or gallop Abdomen: soft, non-tender; bowel sounds normal; no masses,  no organomegaly Pulses: 2+ and symmetric Skin: Skin  color, texture, turgor normal. No rashes or lesions Lymph nodes: Cervical, supraclavicular, and axillary nodes normal.  No results found for: HGBA1C  Lab Results  Component Value Date   CREATININE 0.67 10/08/2016   CREATININE 0.84 10/07/2016   CREATININE 0.82 10/06/2016    Lab Results  Component Value Date   WBC 9.4 10/08/2016   HGB 13.4 10/08/2016   HCT 38.4 10/08/2016   PLT 201 10/08/2016   GLUCOSE 109 (H) 10/08/2016   CHOL 211 (H) 12/14/2015   TRIG 57.0 12/14/2015   HDL 86.90 12/14/2015   LDLCALC 113 (H) 12/14/2015   ALT 15 12/14/2015   AST 22 12/14/2015   NA 138 10/08/2016   K 3.9 10/08/2016   CL 106 10/08/2016   CREATININE 0.67 10/08/2016   BUN 9 10/08/2016   CO2 26 10/08/2016   TSH 0.78 11/21/2014    Dg Chest 2 View  Result Date: 10/06/2016 CLINICAL DATA:  Pt states at 430 she had a sudden onset of right sided chest pain, pt reports having chills, hx of bilat breast lumpectomy EXAM: CHEST  2 VIEW COMPARISON:  11/13/2014 FINDINGS: The cardiac silhouette is normal in size and configuration. No mediastinal or hilar masses or evidence of adenopathy. Lungs are hyperexpanded. Mild scarring noted in the anterior lung bases. Lungs otherwise clear. No pleural effusion or pneumothorax. Skeletal structures are intact. IMPRESSION: No acute cardiopulmonary disease. Hyperexpanded lungs consistent with COPD. Electronically Signed   By: Lajean Manes M.D.   On: 10/06/2016 20:46   Ct Angio Chest Pe W And/or Wo Contrast  Result Date: 10/07/2016 CLINICAL DATA:  Right-sided chest pain and chills. EXAM: CT ANGIOGRAPHY CHEST WITH CONTRAST TECHNIQUE: Multidetector CT imaging of the chest was performed using the standard protocol during bolus administration of intravenous contrast. Multiplanar CT image reconstructions and MIPs were obtained to evaluate the vascular anatomy. CONTRAST:  75 cc Isovue 370 IV COMPARISON:  None. FINDINGS: Cardiovascular: No pulmonary embolus. No aortic aneurysm or  dissection. Minimal aortic atherosclerosis. Normal sized heart without pericardial effusion or thickening. Mediastinum/Nodes: Normal appearing thyroid gland. Normal branch pattern of the great vessels. No mediastinal or hilar lymphadenopathy. The trachea mainstem bronchi as well as esophagus are nonacute. Lungs/Pleura: Patchy pneumonic consolidations and/or atelectasis in the medial right middle lobe, lingula and infrahilar right lower lobe. Tiny acinar densities in the left upper lobe. Biapical pleuroparenchymal scarring. No effusion or pneumothorax. Upper Abdomen: No acute abnormality. Musculoskeletal: No chest wall abnormality. No acute or significant osseous findings. Review of the MIP images confirms the above findings. IMPRESSION: 1. Small pulmonary consolidations in the right middle lobe, infrahilar right lower lobe and lingula suspicious for pneumonia and/or atelectasis. 2. Tiny acinar densities in the left upper lobe may also represent postinfectious changes of a bronchopneumonia. 3. No acute pulmonary embolus. Aortic Atherosclerosis (ICD10-I70.0). Electronically Signed   By: Ashley Royalty M.D.   On: 10/07/2016 00:55    Assessment & Plan:  Problem List Items Addressed This Visit    Pneumonia    Multifocal.  Treated with levaquin..  Repeat chest x ray in 4 to 6 weeks.       Relevant Medications   azithromycin (ZITHROMAX) 250 MG tablet   fluconazole (DIFLUCAN) 150 MG tablet   cetirizine (ZYRTEC) 10 MG tablet   Leukocytosis   Relevant Orders   CBC with Differential   Hospital discharge follow-up    Patient is stable post discharge and has no new issues or questions about discharge plans at the visit today for hospital follow up. All labs , imaging studies and progress notes from admission were reviewed with patient today        Abnormal EKG - Primary    ST depression in lateral leads noted during hospitalization for multifocal pneumonia has resolved on repeat EKG.  She has deferred  cardiology evaluation       Relevant Orders   EKG 12-Lead (Completed)      I have discontinued Ms. Lanting's guaiFENesin. I am also having her start on azithromycin, predniSONE, and fluconazole. Additionally, I am having her maintain her multivitamin, Vitamin D3, Probiotic Product (SOLUBLE FIBER/PROBIOTICS PO), diazepam, rizatriptan, zolpidem, PHENObarbital, SPIRIVA HANDIHALER, dicyclomine, aspirin EC, cetirizine, magnesium oxide, Cranberry, Biotin, and calcium-vitamin D.  Meds ordered this encounter  Medications  . azithromycin (ZITHROMAX) 250 MG tablet    Sig: 2 tablets one day 1,  One tablet daily until gone    Dispense:  6 tablet    Refill:  0  . predniSONE (DELTASONE) 10 MG tablet    Sig: 6 tablets on Day 1 , then reduce by 1 tablet daily until gone    Dispense:  21 tablet    Refill:  0  . fluconazole (DIFLUCAN) 150 MG tablet    Sig: Take 1 tablet (150 mg total) by mouth daily.    Dispense:  2 tablet    Refill:  0  . cetirizine (ZYRTEC) 10 MG tablet    Sig: Take 10 mg by mouth daily.  . magnesium oxide (MAG-OX) 400 MG tablet    Sig: Take 400 mg by mouth daily.  . Cranberry 500 MG TABS    Sig: Take 1 tablet by mouth daily.  . Biotin 1000 MCG tablet    Sig: Take 1,000 mcg by mouth daily.  . calcium-vitamin D (OSCAL WITH D) 500-200 MG-UNIT tablet    Sig: Take 1 tablet by mouth 2 (two) times daily.    Medications Discontinued During This Encounter  Medication Reason  . guaiFENesin (MUCINEX) 600 MG 12 hr tablet Patient has not taken in last 30 days    Follow-up: No Follow-up on file.   Crecencio Mc, MD

## 2016-10-20 DIAGNOSIS — Z09 Encounter for follow-up examination after completed treatment for conditions other than malignant neoplasm: Secondary | ICD-10-CM | POA: Insufficient documentation

## 2016-10-20 NOTE — Assessment & Plan Note (Signed)
ST depression in lateral leads noted during hospitalization for multifocal pneumonia has resolved on repeat EKG.  She has deferred cardiology evaluation

## 2016-10-20 NOTE — Assessment & Plan Note (Signed)
Patient is stable post discharge and has no new issues or questions about discharge plans at the visit today for hospital follow up. All labs , imaging studies and progress notes from admission were reviewed with patient today   

## 2016-10-20 NOTE — Assessment & Plan Note (Signed)
Multifocal.  Treated with levaquin..  Repeat chest x ray in 4 to 6 weeks.

## 2016-10-24 ENCOUNTER — Other Ambulatory Visit: Payer: Self-pay | Admitting: Internal Medicine

## 2016-10-24 MED ORDER — NYSTATIN 100000 UNIT/ML MT SUSP: 5.0000 mL | Freq: Four times a day (QID) | OROMUCOSAL | 0 refills | Status: DC

## 2016-11-09 ENCOUNTER — Ambulatory Visit (INDEPENDENT_AMBULATORY_CARE_PROVIDER_SITE_OTHER): Payer: BLUE CROSS/BLUE SHIELD | Admitting: Family

## 2016-11-09 ENCOUNTER — Encounter: Payer: Self-pay | Admitting: Family

## 2016-11-09 VITALS — BP 120/70 | HR 88 | Temp 99.0°F | Resp 18 | Wt 136.1 lb

## 2016-11-09 DIAGNOSIS — J40 Bronchitis, not specified as acute or chronic: Secondary | ICD-10-CM | POA: Diagnosis not present

## 2016-11-09 MED ORDER — PREDNISONE 10 MG (21) PO TBPK: ORAL_TABLET | ORAL | 0 refills | Status: AC

## 2016-11-09 NOTE — Progress Notes (Signed)
Pre visit review using our clinic review tool, if applicable. No additional management support is needed unless otherwise documented below in the visit note. 

## 2016-11-09 NOTE — Progress Notes (Signed)
Subjective:    Patient ID: Barbara Odom, female    DOB: 01/17/1951, 66 y.o.   MRN: [id number]  CC: Barbara Odom is a 66 y.o. female who presents today for an acute visit.    HPI: 'RUL pain on inspiration' at work yesterday and wanted to be evaluated since recent pneumonia. Pain has has improved from yesterday.   'Feels hot' area at right side of chest wall where ' pneumonia had been'. No chills, sob, cp, palpitations,cough, congestion,N, V. Took motrin with relief.   Took zpak and then started prednisone taper 6 days ago, had felt better.      Hospitalized 7/19 multifocal pneumonia, right-sided. Started on levofloxacin pain 7 echocardiogram done as outpatient. Suspect chest tightness during hospital course likely due to pneumonia. Advised patient also have a stress test as outpatient and continue aspirin therapy  Was seen by PCP 8/1 for EKG changes noted during hospitalization. She has deferred cardiac evaluation at this time. Advised to repeat chest x-ray in 4-6 weeks  Reviewed chart in 4/ 2013 patient   had a prolonged episode of bronchitis was treated with a ten-day Dosepak of prednisone and doxycycline. At that time, patient reports today that she had resolved symptoms with regimen.  HISTORY:  Past Medical History:  Diagnosis Date  . 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 History of diverticulitis of colon   . Migraine headache   . Osteopenia   . seasonal rhinitis    Past Surgical History:  Procedure Laterality Date  . BREAST BIOPSY Left 1997   Negative --- Pt states this procedure was a lumpect.  Marland Kitchen BREAST BIOPSY Right 2005   Negative  . BREAST SURGERY     lumpectomy, benign   . CARPAL TUNNEL RELEASE  1983   bilateral   . CERVICAL DISCECTOMY  August 2012   [place]   Family History  Problem Relation Age of Onset  . Heart disease Mother        Atrial fibrillation  . Cancer Maternal Grandmother        colon Ca  . Cancer Maternal  Grandfather        Colon CA    Allergies: Penicillins Current Outpatient Prescriptions on File Prior to Visit  Medication Sig Dispense Refill  . aspirin EC 81 MG tablet Take 1 tablet (81 mg total) by mouth daily. 30 tablet 0  . Biotin 1000 MCG tablet Take 1,000 mcg by mouth daily.    . calcium-vitamin D (OSCAL WITH D) 500-200 MG-UNIT tablet Take 1 tablet by mouth 2 (two) times daily.    . cetirizine (ZYRTEC) 10 MG tablet Take 10 mg by mouth daily.    . Cholecalciferol (VITAMIN D3) 1000 UNITS CAPS Take by mouth.    . Cranberry 500 MG TABS Take 1 tablet by mouth daily.    . diazepam (VALIUM) 5 MG tablet TAKE 1 TABLET BY ORAL ROUTE EVERY 8 HOURS AS NEEDED  1  . dicyclomine (BENTYL) 10 MG capsule Take 10 mg by mouth 4 (four) times daily -  before meals and at bedtime.    . magnesium oxide (MAG-OX) 400 MG tablet Take 400 mg by mouth daily.    . Multiple Vitamin (MULTIVITAMIN) tablet Take 1 tablet by mouth daily.      Marland Kitchen PHENObarbital (LUMINAL) 60 MG tablet TAKE 1 TABLET BY MOUTH ONCE PER DAY 7 tablet 0  . Probiotic Product (SOLUBLE FIBER/PROBIOTICS PO) Take 1 capsule by mouth daily.    Marland Kitchen  rizatriptan (MAXALT) 10 MG tablet Take 1 tablet (10 mg total) by mouth as needed for migraine. May repeat in 2 hours if needed 30 tablet 3  . SPIRIVA HANDIHALER 18 MCG inhalation capsule PLACE 1 CAPSULE (18 MCG TOTAL) INTO INHALER AND INHALE DAILY. 30 capsule 0  . zolpidem (AMBIEN) 10 MG tablet TAKE 1/2 TABLET BY MOUTH AT BEDTIME AS NEEDED 30 tablet 0  . azithromycin (ZITHROMAX) 250 MG tablet 2 tablets one day 1,  One tablet daily until gone (Patient not taking: Reported on 11/09/2016) 6 tablet 0  . fluconazole (DIFLUCAN) 150 MG tablet Take 1 tablet (150 mg total) by mouth daily. (Patient not taking: Reported on 11/09/2016) 2 tablet 0  . nystatin (MYCOSTATIN) 100000 UNIT/ML suspension Take 5 mLs (500,000 Units total) by mouth 4 (four) times daily. (Patient not taking: Reported on 11/09/2016) 60 mL 0   No current  facility-administered medications on file prior to visit.     Social History  Substance Use Topics  . Smoking status: Never Smoker  . Smokeless tobacco: Never Used  . Alcohol use Yes    Review of Systems  Constitutional: Negative for chills and fever.  HENT: Negative for congestion.   Respiratory: Positive for chest tightness. Negative for cough, shortness of breath and wheezing.   Cardiovascular: Negative for chest pain and palpitations.  Gastrointestinal: Negative for nausea and vomiting.      Objective:    BP 120/70 (BP Location: Right Arm, Patient Position: Sitting, Cuff Size: Normal)   Pulse 88   Temp 99 F (37.2 C) (Oral)   Resp 18   Wt 136 lb 2 oz (61.7 kg)   SpO2 98%   BMI 21.32 kg/m    Physical Exam  Constitutional: She appears well-developed and well-nourished.  Eyes: Conjunctivae are normal.  Cardiovascular: Normal rate, regular rhythm, normal heart sounds and normal pulses.   Pulmonary/Chest: Effort normal and breath sounds normal. She has no wheezes. She has no rhonchi. She has no rales. Chest wall is not dull to percussion. She exhibits bony tenderness. She exhibits no mass, no tenderness, no crepitus, no edema, no deformity and no swelling.  No E to A changes   Neurological: She is alert.  Skin: Skin is warm and dry.  Psychiatric: She has a normal mood and affect. Her speech is normal and behavior is normal. Thought content normal.  Vitals reviewed.      Assessment & Plan:  1. Bronchitis Temperature today 99. Reassured by normal physical exam today. No evidence of consolidation, no tenderness to the chest wall with palpation. Patient is well-appearing and in no acute respiratory distress.   We discussed her hospitalization and recent treatment as an outpatient with azithromycin, prednisone. Jointly agreed that likely what patient is experiencing residual inflamation from prior pneumonia. We jointly suspect that she's been adequately  treated with  antibiotics and it would be more appropriate today to extend her prednisone taper. 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 She will let us know if not complete resolve of symptoms.   - predniSONE (STERAPRED UNI-PAK 21 TAB) 10 MG (21) TBPK tablet; Take 60mg  day one, then taper by 10 mg daily.  Dispense: 21 tablet; Refill: 0     I have discontinued Ms. Milos's predniSONE. I am also having her start on predniSONE. Additionally, I am having her maintain her multivitamin, Vitamin D3, Probiotic Product (SOLUBLE FIBER/PROBIOTICS PO), diazepam, rizatriptan, zolpidem, PHENObarbital, SPIRIVA HANDIHALER, dicyclomine,  aspirin EC, azithromycin, fluconazole, cetirizine, magnesium oxide, Cranberry, Biotin, calcium-vitamin D, and nystatin.   Meds ordered this encounter  Medications  . predniSONE (STERAPRED UNI-PAK 21 TAB) 10 MG (21) TBPK tablet    Sig: Take 60mg  day one, then taper by 10 mg daily.    Dispense:  21 tablet    Refill:  0    Order Specific Question:   Supervising Provider    Answer:   Crecencio Mc [[id number]]    Return precautions given.   Risks, benefits, and alternatives of the medications and treatment plan prescribed today were discussed, and patient expressed understanding.   Education regarding symptom management and diagnosis given to patient on AVS.  Continue to follow with Crecencio Mc, MD for routine health maintenance.   Barbara Odom and I agreed with plan.   Mable Paris, FNP

## 2016-11-09 NOTE — Patient Instructions (Signed)
Start prednisone 6 day pack  Since recent antibiotics, ensure you are on probiotics  Let us know how you are doing and any new symptoms present

## 2016-12-14 ENCOUNTER — Ambulatory Visit (INDEPENDENT_AMBULATORY_CARE_PROVIDER_SITE_OTHER): Payer: BLUE CROSS/BLUE SHIELD | Admitting: Internal Medicine

## 2016-12-14 ENCOUNTER — Ambulatory Visit (INDEPENDENT_AMBULATORY_CARE_PROVIDER_SITE_OTHER): Payer: BLUE CROSS/BLUE SHIELD

## 2016-12-14 ENCOUNTER — Encounter: Payer: Self-pay | Admitting: Internal Medicine

## 2016-12-14 VITALS — BP 130/64 | HR 81 | Temp 98.9°F | Resp 15 | Ht 67.0 in | Wt 133.6 lb

## 2016-12-14 DIAGNOSIS — Z8701 Personal history of pneumonia (recurrent): Secondary | ICD-10-CM

## 2016-12-14 DIAGNOSIS — R5383 Other fatigue: Secondary | ICD-10-CM

## 2016-12-14 DIAGNOSIS — Z124 Encounter for screening for malignant neoplasm of cervix: Secondary | ICD-10-CM | POA: Diagnosis not present

## 2016-12-14 DIAGNOSIS — G40909 Epilepsy, unspecified, not intractable, without status epilepticus: Secondary | ICD-10-CM | POA: Diagnosis not present

## 2016-12-14 DIAGNOSIS — E7800 Pure hypercholesterolemia, unspecified: Secondary | ICD-10-CM | POA: Diagnosis not present

## 2016-12-14 DIAGNOSIS — J209 Acute bronchitis, unspecified: Secondary | ICD-10-CM

## 2016-12-14 DIAGNOSIS — J189 Pneumonia, unspecified organism: Secondary | ICD-10-CM | POA: Diagnosis not present

## 2016-12-14 DIAGNOSIS — R9431 Abnormal electrocardiogram [ECG] [EKG]: Secondary | ICD-10-CM

## 2016-12-14 DIAGNOSIS — Z1211 Encounter for screening for malignant neoplasm of colon: Secondary | ICD-10-CM

## 2016-12-14 DIAGNOSIS — J440 Chronic obstructive pulmonary disease with acute lower respiratory infection: Secondary | ICD-10-CM

## 2016-12-14 DIAGNOSIS — M85852 Other specified disorders of bone density and structure, left thigh: Secondary | ICD-10-CM

## 2016-12-14 DIAGNOSIS — Z0000 Encounter for general adult medical examination without abnormal findings: Secondary | ICD-10-CM | POA: Diagnosis not present

## 2016-12-14 LAB — LIPID PANEL
CHOLESTEROL: 237 mg/dL — AB (ref 0–200)
HDL: 91.9 mg/dL (ref >39.00)
LDL CALC: 134 mg/dL — AB (ref 0–99)
NonHDL: 145.08
TRIGLYCERIDES: 56 mg/dL (ref 0.0–149.0)
Total CHOL/HDL Ratio: 3
VLDL: 11.2 mg/dL (ref 0.0–40.0)

## 2016-12-14 LAB — COMPREHENSIVE METABOLIC PANEL
ALBUMIN: 4.2 g/dL (ref 3.5–5.2)
ALK PHOS: 46 U/L (ref 39–117)
ALT: 14 U/L (ref 0–35)
AST: 22 U/L (ref 0–37)
BILIRUBIN TOTAL: 0.6 mg/dL (ref 0.2–1.2)
BUN: 11 mg/dL (ref 6–23)
CO2: 30 mEq/L (ref 19–32)
CREATININE: 0.78 mg/dL (ref 0.40–1.20)
Calcium: 9.9 mg/dL (ref 8.4–10.5)
Chloride: 101 mEq/L (ref 96–112)
GFR: 78.97 mL/min (ref >60.00)
Glucose, Bld: 91 mg/dL (ref 70–99)
Potassium: 3.6 mEq/L (ref 3.5–5.1)
SODIUM: 140 meq/L (ref 135–145)
TOTAL PROTEIN: 7 g/dL (ref 6.0–8.3)

## 2016-12-14 LAB — IBC PANEL
Iron: 65 ug/dL (ref 42–145)
SATURATION RATIOS: 21.1 % (ref 20.0–50.0)
TRANSFERRIN: 220 mg/dL (ref 212.0–360.0)

## 2016-12-14 LAB — TSH: TSH: 1.47 u[IU]/mL (ref 0.35–4.50)

## 2016-12-14 LAB — FERRITIN: Ferritin: 66.9 ng/mL (ref 10.0–291.0)

## 2016-12-14 LAB — LDL CHOLESTEROL, DIRECT: Direct LDL: 114 mg/dL

## 2016-12-14 MED ORDER — PREDNISONE 10 MG PO TABS: ORAL_TABLET | ORAL | 0 refills | Status: DC

## 2016-12-14 MED ORDER — LEVOFLOXACIN 500 MG PO TABS: 500.0000 mg | ORAL_TABLET | Freq: Every day | ORAL | 0 refills | Status: DC

## 2016-12-14 MED ORDER — FLUCONAZOLE 150 MG PO TABS: 150.0000 mg | ORAL_TABLET | Freq: Every day | ORAL | 0 refills | Status: DC

## 2016-12-14 MED ORDER — PROMETHAZINE-DM 6.25-15 MG/5ML PO SYRP: 5.0000 mL | ORAL_SOLUTION | Freq: Four times a day (QID) | ORAL | 0 refills | Status: DC | PRN

## 2016-12-14 NOTE — Telephone Encounter (Signed)
Error

## 2016-12-14 NOTE — Progress Notes (Signed)
Patient ID: Barbara Odom, female    DOB: July 07, 1950  Age: 66 y.o. MRN: [id number]  The patient is here for annual preventive examination and management of other chronic and acute problems.  Sees GYN in [place] for PAP and mammogram  PAP NORMAL NOV 2017  COLON TO BE DONE BY [facility] IN DECEMBER 2018    The risk factors are reflected in the social history.  The roster of all physicians providing medical care to patient - is listed in the Snapshot section of the chart.  Activities of daily living:  The patient is 100% independent in all ADLs: dressing, toileting, feeding as well as independent mobility  Home safety : The patient has smoke detectors in the home. They wear seatbelts.  There are no firearms at home. There is no violence in the home.   There is no risks for hepatitis, STDs or HIV. There is no   history of blood transfusion. They have no travel history to infectious disease endemic areas of the world.  The patient has seen their dentist in the last six month. They have seen their eye doctor in the last year.   . Discussed the need for sun protection: hats, long sleeves and use of sunscreen if there is significant sun exposure.   Diet: the importance of a healthy diet is discussed. They do have a healthy diet.  The benefits of regular aerobic exercise were discussed. She walks 4 times per week ,  20 minutes.   Depression screen: there are no signs or vegative symptoms of depression- irritability, change in appetite, anhedonia, sadness/tearfullness.  Cognitive assessment: the patient manages all their financial and personal affairs and is actively engaged. They could relate day,date,year and events; recalled 3/3 objects at 3 minutes; performed clock-face test normally.  The following portions of the patient's history were reviewed and updated as appropriate: allergies, current medications, past family history, past medical history,  past surgical history, past social history  and  problem list.  Visual acuity was not assessed per patient preference since she has regular follow up with her ophthalmologist. Hearing and body mass index were assessed and reviewed.   During the course of the visit the patient was educated and counseled about appropriate screening and preventive services including : fall prevention , diabetes screening, nutrition counseling, colorectal cancer screening, and recommended immunizations.    CC: The primary encounter diagnosis was Encounter for preventive health examination. Diagnoses of Fatigue, unspecified type, Pure hypercholesterolemia, History of pneumonia, Seizure disorder ([place]), Screening for colon cancer, Screening for cervical cancer, Osteopenia of neck of left femur, Bronchitis, chronic obstructive w acute bronchitis ([place]), Abnormal EKG, and Pneumonia due to infectious organism, unspecified laterality, unspecified part of lung were also pertinent to this visit.  URI with sore throat  Started one week ago , lasted about  2 days,  Followed by Rhinitis,  Pale yellow,  Light green sputum.  No body aches.  fever yesterday of 100.00 now having bronchitis and laryngitis. Marland Kitchen  History Nicolina has a past medical history of grand mal; History of diverticulitis of colon; Migraine headache; Osteopenia; and seasonal rhinitis.   She has a past surgical history that includes Carpal tunnel release (1983); Breast surgery; Cervical discectomy (August 2012); Breast biopsy (Left, 1997); and Breast biopsy (Right, 2005).   Her family history includes Cancer in her maternal grandfather and maternal grandmother; Heart disease in her mother.She reports that she has never smoked. She has never used smokeless tobacco. She reports that she drinks  alcohol. She reports that she does not use drugs.  Outpatient Medications Prior to Visit  Medication Sig Dispense Refill  . aspirin EC 81 MG tablet Take 1 tablet (81 mg total) by mouth daily. 30 tablet 0  . Biotin 1000 MCG  tablet Take 1,000 mcg by mouth daily.    . calcium-vitamin D (OSCAL WITH D) 500-200 MG-UNIT tablet Take 1 tablet by mouth 2 (two) times daily.    . cetirizine (ZYRTEC) 10 MG tablet Take 10 mg by mouth daily.    . Cholecalciferol (VITAMIN D3) 1000 UNITS CAPS Take by mouth.    . Cranberry 500 MG TABS Take 1 tablet by mouth daily.    . diazepam (VALIUM) 5 MG tablet TAKE 1 TABLET BY ORAL ROUTE EVERY 8 HOURS AS NEEDED  1  . dicyclomine (BENTYL) 10 MG capsule Take 10 mg by mouth 4 (four) times daily -  before meals and at bedtime.     . magnesium oxide (MAG-OX) 400 MG tablet Take 400 mg by mouth daily.    . Multiple Vitamin (MULTIVITAMIN) tablet Take 1 tablet by mouth daily.      Marland Kitchen PHENObarbital (LUMINAL) 60 MG tablet TAKE 1 TABLET BY MOUTH ONCE PER DAY 7 tablet 0  . Probiotic Product (SOLUBLE FIBER/PROBIOTICS PO) Take 1 capsule by mouth daily.    . rizatriptan (MAXALT) 10 MG tablet Take 1 tablet (10 mg total) by mouth as needed for migraine. May repeat in 2 hours if needed 30 tablet 3  . SPIRIVA HANDIHALER 18 MCG inhalation capsule PLACE 1 CAPSULE (18 MCG TOTAL) INTO INHALER AND INHALE DAILY. 30 capsule 0  . zolpidem (AMBIEN) 10 MG tablet TAKE 1/2 TABLET BY MOUTH AT BEDTIME AS NEEDED 30 tablet 0  . azithromycin (ZITHROMAX) 250 MG tablet 2 tablets one day 1,  One tablet daily until gone (Patient not taking: Reported on 11/09/2016) 6 tablet 0  . fluconazole (DIFLUCAN) 150 MG tablet Take 1 tablet (150 mg total) by mouth daily. (Patient not taking: Reported on 11/09/2016) 2 tablet 0  . nystatin (MYCOSTATIN) 100000 UNIT/ML suspension Take 5 mLs (500,000 Units total) by mouth 4 (four) times daily. (Patient not taking: Reported on 12/14/2016) 60 mL 0   No facility-administered medications prior to visit.     Review of Systems   Patient denies headache, fevers, malaise, unintentional weight loss, skin rash, eye pain, sinus congestion and sinus pain, sore throat, dysphagia,  hemoptysis , cough, dyspnea,  wheezing, chest pain, palpitations, orthopnea, edema, abdominal pain, nausea, melena, diarrhea, constipation, flank pain, dysuria, hematuria, urinary  Frequency, nocturia, numbness, tingling, seizures,  Focal weakness, Loss of consciousness,  Tremor, insomnia, depression, anxiety, and suicidal ideation.      Objective:  BP 130/64 (BP Location: Left Arm, Patient Position: Sitting, Cuff Size: Normal)   Pulse 81   Temp 98.9 F (37.2 C) (Oral)   Resp 15   Ht 5\' 7"  (1.702 m)   Wt 133 lb 9.6 oz (60.6 kg)   SpO2 97%   BMI 20.92 kg/m   Physical Exam   General appearance: alert, cooperative and appears stated age Ears: normal TM's and external ear canals both ears Throat: lips, mucosa, and tongue normal; teeth and gums normal Neck: no adenopathy, no carotid bruit, supple, symmetrical, trachea midline and thyroid not enlarged, symmetric, no tenderness/mass/nodules Back: symmetric, no curvature. ROM normal. No CVA tenderness. Lungs: clear to auscultation bilaterally Heart: regular rate and rhythm, S1, S2 normal, no murmur, click, rub or gallop Abdomen: soft, non-tender; bowel  sounds normal; no masses,  no organomegaly Pulses: 2+ and symmetric Skin: Skin color, texture, turgor normal. No rashes or lesions Lymph nodes: Cervical, supraclavicular, and axillary nodes normal.    Assessment & Plan:   Problem List Items Addressed This Visit    Abnormal EKG    ST depression in lateral leads noted during hospitalization for multifocal pneumonia had resolved on repeat EKG.  She has deferred cardiology evaluation and is asymptomatic       Bronchitis, chronic obstructive w acute bronchitis (HCC)    Current symptoms suggestive of bronchitis.  6 week follow up chest  Ray from hospitalization for Community acquired pneumonia repeated,  Infiltrate still seen.  Advised to repeat ab course   Levaquin given       Relevant Medications   predniSONE (DELTASONE) 10 MG tablet   promethazine-dextromethorphan  (PROMETHAZINE-DM) 6.25-15 MG/5ML syrup   Encounter for preventive health examination - Primary    Annual comprehensive preventive exam was done as well as an evaluation and management of chronic conditions .  During the course of the visit the patient was educated and counseled about appropriate screening and preventive services including :  diabetes screening, lipid analysis with projected  10 year  risk for CAD , nutrition counseling, breast, cervical and colorectal cancer screening, and recommended immunizations.  Printed recommendations for health maintenance screenings was given.  Lab Results  Component Value Date   CHOL 237 (H) 12/14/2016   HDL 91.90 12/14/2016   LDLCALC 134 (H) 12/14/2016   LDLDIRECT 114.0 12/14/2016   TRIG 56.0 12/14/2016   CHOLHDL 3 12/14/2016   Lab Results  Component Value Date   CREATININE 0.78 12/14/2016  \ Lab Results  Component Value Date   IRON 65 12/14/2016   TIBC 281 12/14/2015   FERRITIN 66.9 12/14/2016   Lab Results  Component Value Date   TSH 1.47 12/14/2016         Fatigue    Screening labs normal.  No history of snoring.  Recommended participating in regular exercise program with goal of achieving a minimum of 30 minutes of aerobic activity 5 days per week.   Lab Results  Component Value Date   CREATININE 0.79 04/20/2015   Lab Results  Component Value Date   ALT 14 04/20/2015   AST 18 04/20/2015   ALKPHOS 69 04/20/2015   BILITOT 0.6 04/20/2015   Lab Results  Component Value Date   TSH 1.68 04/20/2015   Lab Results  Component Value Date   WBC 8.6 04/20/2015   HGB 14.7 04/20/2015   HCT 43.8 04/20/2015   MCV 92.7 04/20/2015   PLT 248.0 04/20/2015        Relevant Orders   Ferritin (Completed)   IBC panel (Completed)   LDL cholesterol, direct (Completed)   Comprehensive metabolic panel (Completed)   TSH (Completed)   Osteopenia    T score L hip -2.0 by DEXA May 2018,.  10 yr risk of any fracture 10%      Pneumonia     Chest x ray has not cleared in the setting of recurrent symptoms. Levaquin 500 mg daily,  prednsione taper       Relevant Medications   levofloxacin (LEVAQUIN) 500 MG tablet   fluconazole (DIFLUCAN) 150 MG tablet   promethazine-dextromethorphan (PROMETHAZINE-DM) 6.25-15 MG/5ML syrup   Screening for cervical cancer    Done by gyn every 3 years with annual pelvics by GYN      Screening for colon cancer    Recommended  colonoscopy Every 5 years due to singificant family history.  Scheduled with [facility]       Seizure disorder ([place])    Since adolescence.  Has been seizure free on low dose phenobarbital with no adverse effects.        Other Visit Diagnoses    Pure hypercholesterolemia       Relevant Orders   Lipid panel (Completed)   History of pneumonia       Relevant Orders   DG Chest 2 View (Completed)      I have discontinued Ms. Djordjevic's azithromycin, fluconazole, and nystatin. I am also having her start on levofloxacin, predniSONE, fluconazole, and promethazine-dextromethorphan. Additionally, I am having her maintain her multivitamin, Vitamin D3, Probiotic Product (SOLUBLE FIBER/PROBIOTICS PO), diazepam, rizatriptan, zolpidem, PHENObarbital, SPIRIVA HANDIHALER, dicyclomine, aspirin EC, cetirizine, magnesium oxide, Cranberry, Biotin, and calcium-vitamin D.  Meds ordered this encounter  Medications  . levofloxacin (LEVAQUIN) 500 MG tablet    Sig: Take 1 tablet (500 mg total) by mouth daily.    Dispense:  7 tablet    Refill:  0  . predniSONE (DELTASONE) 10 MG tablet    Sig: 6 tablets on Day 1 , then reduce by 1 tablet daily until gone    Dispense:  21 tablet    Refill:  0  . fluconazole (DIFLUCAN) 150 MG tablet    Sig: Take 1 tablet (150 mg total) by mouth daily.    Dispense:  3 tablet    Refill:  0  . promethazine-dextromethorphan (PROMETHAZINE-DM) 6.25-15 MG/5ML syrup    Sig: Take 5 mLs by mouth 4 (four) times daily as needed for cough.    Dispense:  180 mL     Refill:  0    Medications Discontinued During This Encounter  Medication Reason  . azithromycin (ZITHROMAX) 250 MG tablet Patient has not taken in last 30 days  . fluconazole (DIFLUCAN) 150 MG tablet Patient has not taken in last 30 days  . nystatin (MYCOSTATIN) 100000 UNIT/ML suspension Patient has not taken in last 30 days    Follow-up: No Follow-up on file.   Crecencio Mc, MD

## 2016-12-14 NOTE — Patient Instructions (Addendum)
Start the prednisone taper now  Save the levaquin for:  Fever over 100.4  Sinus or ear pain  Chest pain,  Purulent sputum , worsening cough  TAKE A PROBIOTIC !  For the cough   benadryl 25 mg plus Mucinex DM for nighttime cough  Or  the phenergan cough syrup  I sent in

## 2016-12-14 NOTE — Telephone Encounter (Signed)
Orders

## 2016-12-16 NOTE — Telephone Encounter (Signed)
Error

## 2016-12-17 ENCOUNTER — Encounter: Payer: Self-pay | Admitting: Internal Medicine

## 2016-12-17 DIAGNOSIS — R5383 Other fatigue: Secondary | ICD-10-CM | POA: Insufficient documentation

## 2016-12-17 NOTE — Assessment & Plan Note (Signed)
ST depression in lateral leads noted during hospitalization for multifocal pneumonia had resolved on repeat EKG.  She has deferred cardiology evaluation and is asymptomatic

## 2016-12-17 NOTE — Assessment & Plan Note (Signed)
Screening labs normal.  No history of snoring.  Recommended participating in regular exercise program with goal of achieving a minimum of 30 minutes of aerobic activity 5 days per week.   Lab Results  Component Value Date   CREATININE 0.79 04/20/2015   Lab Results  Component Value Date   ALT 14 04/20/2015   AST 18 04/20/2015   ALKPHOS 69 04/20/2015   BILITOT 0.6 04/20/2015   Lab Results  Component Value Date   TSH 1.68 04/20/2015   Lab Results  Component Value Date   WBC 8.6 04/20/2015   HGB 14.7 04/20/2015   HCT 43.8 04/20/2015   MCV 92.7 04/20/2015   PLT 248.0 04/20/2015    

## 2016-12-17 NOTE — Assessment & Plan Note (Signed)
Done by gyn every 3 years with annual pelvics by GYN 

## 2016-12-17 NOTE — Assessment & Plan Note (Signed)
Recommended colonoscopy Every 5 years due to singificant family history.  Scheduled with [facility]

## 2016-12-17 NOTE — Assessment & Plan Note (Signed)
Current symptoms suggestive of bronchitis.  6 week follow up chest  Ray from hospitalization for Community acquired pneumonia repeated,  Infiltrate still seen.  Advised to repeat ab course   Levaquin given

## 2016-12-17 NOTE — Assessment & Plan Note (Signed)
Chest x ray has not cleared in the setting of recurrent symptoms. Levaquin 500 mg daily,  prednsione taper

## 2016-12-17 NOTE — Assessment & Plan Note (Signed)
Annual comprehensive preventive exam was done as well as an evaluation and management of chronic conditions .  During the course of the visit the patient was educated and counseled about appropriate screening and preventive services including :  diabetes screening, lipid analysis with projected  10 year  risk for CAD , nutrition counseling, breast, cervical and colorectal cancer screening, and recommended immunizations.  Printed recommendations for health maintenance screenings was given.  Lab Results  Component Value Date   CHOL 237 (H) 12/14/2016   HDL 91.90 12/14/2016   LDLCALC 134 (H) 12/14/2016   LDLDIRECT 114.0 12/14/2016   TRIG 56.0 12/14/2016   CHOLHDL 3 12/14/2016   Lab Results  Component Value Date   CREATININE 0.78 12/14/2016  \ Lab Results  Component Value Date   IRON 65 12/14/2016   TIBC 281 12/14/2015   FERRITIN 66.9 12/14/2016   Lab Results  Component Value Date   TSH 1.47 12/14/2016

## 2016-12-17 NOTE — Assessment & Plan Note (Signed)
T score L hip -2.0 by DEXA May 2018,.  10 yr risk of any fracture 10%

## 2016-12-17 NOTE — Assessment & Plan Note (Signed)
Since adolescence.  Has been seizure free on low dose phenobarbital with no adverse effects.  

## 2017-01-02 ENCOUNTER — Ambulatory Visit (INDEPENDENT_AMBULATORY_CARE_PROVIDER_SITE_OTHER): Payer: BLUE CROSS/BLUE SHIELD | Admitting: Internal Medicine

## 2017-01-02 ENCOUNTER — Encounter: Payer: Self-pay | Admitting: Internal Medicine

## 2017-01-02 VITALS — BP 118/62 | HR 71 | Temp 98.7°F | Resp 18 | Wt 132.2 lb

## 2017-01-02 DIAGNOSIS — J440 Chronic obstructive pulmonary disease with acute lower respiratory infection: Secondary | ICD-10-CM

## 2017-01-02 DIAGNOSIS — J189 Pneumonia, unspecified organism: Secondary | ICD-10-CM | POA: Diagnosis not present

## 2017-01-02 DIAGNOSIS — J441 Chronic obstructive pulmonary disease with (acute) exacerbation: Secondary | ICD-10-CM | POA: Diagnosis not present

## 2017-01-02 MED ORDER — AEROCHAMBER PLUS FLO-VU MEDIUM MISC: 1.0000 | Freq: Once | 0 refills | Status: AC

## 2017-01-02 MED ORDER — BECLOMETHASONE DIPROPIONATE 80 MCG/ACT IN AERS: 1.0000 | INHALATION_SPRAY | Freq: Two times a day (BID) | RESPIRATORY_TRACT | 12 refills | Status: DC

## 2017-01-02 NOTE — Progress Notes (Signed)
Subjective:  Patient ID: Barbara Odom, female    DOB: 09/20/1950  Age: 66 y.o. MRN: [id number]  CC: The primary encounter diagnosis was Pneumonia due to infectious organism, unspecified laterality, unspecified part of lung. Diagnoses of Chronic obstructive pulmonary disease with acute exacerbation (HCC) and Bronchitis, chronic obstructive w acute bronchitis ([place]) were also pertinent to this visit.  HPI Barbara Odom presents for follow up on persistent cough and wheezing to pneumonia.  Patient was seen in late September for annual exam and was treated for pneumonia after presenting with new onset productive cough and fevers.. Given her history of pneumonia in late July,  She was due for follow up chest x ray which was done and noted persistent infiltrates  Vs ATX  involving the right lung  Base. The previously noted RML , lingular and RLL lobe infiltrates had improved, and hyperexpansion was noted.  She continues to report mild pleurisy in her right mid anterior chest and is still wheezing with exertion,  But denies fevers AND SPUTUM PRODUCTION .  HAD THRUSH ,  WHICH RESOLVED AFTER 3 DOSES OF FLUCONAZOLE .    Outpatient Medications Prior to Visit  Medication Sig Dispense Refill  . aspirin EC 81 MG tablet Take 1 tablet (81 mg total) by mouth daily. 30 tablet 0  . Biotin 1000 MCG tablet Take 1,000 mcg by mouth daily.    . calcium-vitamin D (OSCAL WITH D) 500-200 MG-UNIT tablet Take 1 tablet by mouth 2 (two) times daily.    . cetirizine (ZYRTEC) 10 MG tablet Take 10 mg by mouth daily.    . Cholecalciferol (VITAMIN D3) 1000 UNITS CAPS Take by mouth.    . Cranberry 500 MG TABS Take 1 tablet by mouth daily.    . diazepam (VALIUM) 5 MG tablet TAKE 1 TABLET BY ORAL ROUTE EVERY 8 HOURS AS NEEDED  1  . dicyclomine (BENTYL) 10 MG capsule Take 10 mg by mouth 4 (four) times daily -  before meals and at bedtime.     . Multiple Vitamin (MULTIVITAMIN) tablet Take 1 tablet by mouth daily.      Marland Kitchen  PHENObarbital (LUMINAL) 60 MG tablet TAKE 1 TABLET BY MOUTH ONCE PER DAY 7 tablet 0  . Probiotic Product (SOLUBLE FIBER/PROBIOTICS PO) Take 1 capsule by mouth daily.    . rizatriptan (MAXALT) 10 MG tablet Take 1 tablet (10 mg total) by mouth as needed for migraine. May repeat in 2 hours if needed 30 tablet 3  . SPIRIVA HANDIHALER 18 MCG inhalation capsule PLACE 1 CAPSULE (18 MCG TOTAL) INTO INHALER AND INHALE DAILY. 30 capsule 0  . zolpidem (AMBIEN) 10 MG tablet TAKE 1/2 TABLET BY MOUTH AT BEDTIME AS NEEDED 30 tablet 0  . fluconazole (DIFLUCAN) 150 MG tablet Take 1 tablet (150 mg total) by mouth daily. (Patient not taking: Reported on 01/02/2017) 3 tablet 0  . levofloxacin (LEVAQUIN) 500 MG tablet Take 1 tablet (500 mg total) by mouth daily. (Patient not taking: Reported on 01/02/2017) 7 tablet 0  . magnesium oxide (MAG-OX) 400 MG tablet Take 400 mg by mouth daily.    . predniSONE (DELTASONE) 10 MG tablet 6 tablets on Day 1 , then reduce by 1 tablet daily until gone (Patient not taking: Reported on 01/02/2017) 21 tablet 0  . promethazine-dextromethorphan (PROMETHAZINE-DM) 6.25-15 MG/5ML syrup Take 5 mLs by mouth 4 (four) times daily as needed for cough. (Patient not taking: Reported on 01/02/2017) 180 mL 0   No facility-administered medications prior to  visit.     Review of Systems;  Patient denies headache, fevers, malaise, unintentional weight loss, skin rash, eye pain, sinus congestion and sinus pain, sore throat, dysphagia,  hemoptysis , cough, dyspnea, wheezing, chest pain, palpitations, orthopnea, edema, abdominal pain, nausea, melena, diarrhea, constipation, flank pain, dysuria, hematuria, urinary  Frequency, nocturia, numbness, tingling, seizures,  Focal weakness, Loss of consciousness,  Tremor, insomnia, depression, anxiety, and suicidal ideation.      Objective:  BP 118/62 (BP Location: Left Arm, Patient Position: Sitting, Cuff Size: Normal)   Pulse 71   Temp 98.7 F (37.1 C) (Oral)    Resp 18   Wt 132 lb 4 oz (60 kg)   SpO2 97%   BMI 20.71 kg/m   BP Readings from Last 3 Encounters:  01/02/17 118/62  12/14/16 130/64  11/09/16 120/70    Wt Readings from Last 3 Encounters:  01/02/17 132 lb 4 oz (60 kg)  12/14/16 133 lb 9.6 oz (60.6 kg)  11/09/16 136 lb 2 oz (61.7 kg)    General appearance: alert, cooperative and appears stated age Ears: normal TM's and external ear canals both ears Throat: lips, mucosa, and tongue normal; teeth and gums normal Neck: no adenopathy, no carotid bruit, supple, symmetrical, trachea midline and thyroid not enlarged, symmetric, no tenderness/mass/nodules Back: symmetric, no curvature. ROM normal. No CVA tenderness. Lungs: clear to auscultation bilaterally Heart: regular rate and rhythm, S1, S2 normal, no murmur, click, rub or gallop Abdomen: soft, non-tender; bowel sounds normal; no masses,  no organomegaly Pulses: 2+ and symmetric Skin: Skin color, texture, turgor normal. No rashes or lesions Lymph nodes: Cervical, supraclavicular, and axillary nodes normal.  No results found for: HGBA1C  Lab Results  Component Value Date   CREATININE 0.78 12/14/2016   CREATININE 0.67 10/08/2016   CREATININE 0.84 10/07/2016    Lab Results  Component Value Date   WBC 9.4 10/08/2016   HGB 13.4 10/08/2016   HCT 38.4 10/08/2016   PLT 201 10/08/2016   GLUCOSE 91 12/14/2016   CHOL 237 (H) 12/14/2016   TRIG 56.0 12/14/2016   HDL 91.90 12/14/2016   LDLDIRECT 114.0 12/14/2016   LDLCALC 134 (H) 12/14/2016   ALT 14 12/14/2016   AST 22 12/14/2016   NA 140 12/14/2016   K 3.6 12/14/2016   CL 101 12/14/2016   CREATININE 0.78 12/14/2016   BUN 11 12/14/2016   CO2 30 12/14/2016   TSH 1.47 12/14/2016    Dg Chest 2 View  Result Date: 10/06/2016 CLINICAL DATA:  Pt states at 430 she had a sudden onset of right sided chest pain, pt reports having chills, hx of bilat breast lumpectomy EXAM: CHEST  2 VIEW COMPARISON:  11/13/2014 FINDINGS: The  cardiac silhouette is normal in size and configuration. No mediastinal or hilar masses or evidence of adenopathy. Lungs are hyperexpanded. Mild scarring noted in the anterior lung bases. Lungs otherwise clear. No pleural effusion or pneumothorax. Skeletal structures are intact. IMPRESSION: No acute cardiopulmonary disease. Hyperexpanded lungs consistent with COPD. Electronically Signed   By: Lajean Manes M.D.   On: 10/06/2016 20:46   Ct Angio Chest Pe W And/or Wo Contrast  Result Date: 10/07/2016 CLINICAL DATA:  Right-sided chest pain and chills. EXAM: CT ANGIOGRAPHY CHEST WITH CONTRAST TECHNIQUE: Multidetector CT imaging of the chest was performed using the standard protocol during bolus administration of intravenous contrast. Multiplanar CT image reconstructions and MIPs were obtained to evaluate the vascular anatomy. CONTRAST:  75 cc Isovue 370 IV COMPARISON:  None. FINDINGS:  Cardiovascular: No pulmonary embolus. No aortic aneurysm or dissection. Minimal aortic atherosclerosis. Normal sized heart without pericardial effusion or thickening. Mediastinum/Nodes: Normal appearing thyroid gland. Normal branch pattern of the great vessels. No mediastinal or hilar lymphadenopathy. The trachea mainstem bronchi as well as esophagus are nonacute. Lungs/Pleura: Patchy pneumonic consolidations and/or atelectasis in the medial right middle lobe, lingula and infrahilar right lower lobe. Tiny acinar densities in the left upper lobe. Biapical pleuroparenchymal scarring. No effusion or pneumothorax. Upper Abdomen: No acute abnormality. Musculoskeletal: No chest wall abnormality. No acute or significant osseous findings. Review of the MIP images confirms the above findings. IMPRESSION: 1. Small pulmonary consolidations in the right middle lobe, infrahilar right lower lobe and lingula suspicious for pneumonia and/or atelectasis. 2. Tiny acinar densities in the left upper lobe may also represent postinfectious changes of a  bronchopneumonia. 3. No acute pulmonary embolus. Aortic Atherosclerosis (ICD10-I70.0). Electronically Signed   By: Ashley Royalty M.D.   On: 10/07/2016 00:55    Assessment & Plan:   Problem List Items Addressed This Visit    Bronchitis, chronic obstructive w acute bronchitis (HCC)    Recommended PFTS when symptoms have resolved.  Followed by pulmonary consult. She reports tachycardia and tremulousness with Symbicort.  Trial of Qvar.        Relevant Medications   beclomethasone (QVAR) 80 MCG/ACT inhaler   Pneumonia - Primary    She will need a repeat CT chest in 2-3 weeks to document resolution of previously reported infiltrates given the persistent abnormalities on plain films       Relevant Medications   beclomethasone (QVAR) 80 MCG/ACT inhaler   Other Relevant Orders   CT Chest Wo Contrast    Other Visit Diagnoses    Chronic obstructive pulmonary disease with acute exacerbation (HCC)       Relevant Medications   beclomethasone (QVAR) 80 MCG/ACT inhaler   Other Relevant Orders   CT Chest Wo Contrast    A total of 25 minutes of face to face time was spent with patient more than half of which was spent in counselling about the above mentioned conditions  and coordination of care   I am having Ms. Majkowski start on beclomethasone and AEROCHAMBER PLUS FLO-VU MEDIUM. I am also having her maintain her multivitamin, Vitamin D3, Probiotic Product (SOLUBLE FIBER/PROBIOTICS PO), diazepam, rizatriptan, zolpidem, PHENObarbital, SPIRIVA HANDIHALER, dicyclomine, aspirin EC, cetirizine, magnesium oxide, Cranberry, Biotin, calcium-vitamin D, levofloxacin, predniSONE, fluconazole, and promethazine-dextromethorphan.  Meds ordered this encounter  Medications  . beclomethasone (QVAR) 80 MCG/ACT inhaler    Sig: Inhale 1 puff into the lungs 2 (two) times daily.    Dispense:  1 Inhaler    Refill:  12  . Spacer/Aero-Holding Chambers (AEROCHAMBER PLUS FLO-VU MEDIUM) MISC    Sig: 1 each by Other route once.     Dispense:  1 each    Refill:  0    TO USE WITH GENERIC QVAR MDI    There are no discontinued medications.  Follow-up: No Follow-up on file.   Crecencio Mc, MD

## 2017-01-03 ENCOUNTER — Telehealth: Payer: Self-pay

## 2017-01-03 NOTE — Telephone Encounter (Signed)
Received a message from [facility] asking if we could send in a different Qvar because the one that was sent in they do not have.

## 2017-01-03 NOTE — Assessment & Plan Note (Signed)
She will need a repeat CT chest in 2-3 weeks to document resolution of previously reported infiltrates given the persistent abnormalities on plain films

## 2017-01-03 NOTE — Telephone Encounter (Signed)
Don't know that that means.  Are they referring to the strength or the size of the MDI? Please  find out which ones they have so I don't have to keep guessing

## 2017-01-03 NOTE — Assessment & Plan Note (Signed)
Recommended PFTS when symptoms have resolved.  Followed by pulmonary consult. She reports tachycardia and tremulousness with Symbicort.  Trial of Qvar.

## 2017-01-04 MED ORDER — BECLOMETHASONE DIPROP HFA 80 MCG/ACT IN AERB: 1.0000 | INHALATION_SPRAY | Freq: Two times a day (BID) | RESPIRATORY_TRACT | 3 refills | Status: DC

## 2017-01-04 NOTE — Addendum Note (Signed)
Addended by: Crecencio Mc on: 01/04/2017 02:53 PM   Modules accepted: Orders

## 2017-01-04 NOTE — Telephone Encounter (Signed)
Spoke with the pharmacist and he stated that it needs to be Qvar Redihaler. He stated that the regular Qvar inhaler that was sent in is phasing out.

## 2017-01-04 NOTE — Telephone Encounter (Signed)
redihaler prescribed and sent,   thank you

## 2017-01-05 ENCOUNTER — Other Ambulatory Visit: Payer: Self-pay | Admitting: Internal Medicine

## 2017-01-05 MED ORDER — NYSTATIN 100000 UNIT/ML MT SUSP: OROMUCOSAL | 0 refills | Status: DC

## 2017-01-17 ENCOUNTER — Encounter: Payer: Self-pay | Admitting: Internal Medicine

## 2017-01-17 ENCOUNTER — Other Ambulatory Visit: Payer: Self-pay | Admitting: Internal Medicine

## 2017-01-17 DIAGNOSIS — R1032 Left lower quadrant pain: Secondary | ICD-10-CM | POA: Insufficient documentation

## 2017-01-17 MED ORDER — CIPROFLOXACIN HCL 500 MG PO TABS: 250.0000 mg | ORAL_TABLET | Freq: Two times a day (BID) | ORAL | 0 refills | Status: DC

## 2017-01-17 NOTE — Progress Notes (Signed)
cicipr

## 2017-01-19 ENCOUNTER — Ambulatory Visit: Admission: RE | Admit: 2017-01-19 | Payer: BLUE CROSS/BLUE SHIELD | Source: Ambulatory Visit

## 2017-01-20 ENCOUNTER — Ambulatory Visit
Admission: RE | Admit: 2017-01-20 | Discharge: 2017-01-20 | Disposition: A | Discharge status: Home / Self Care | Payer: BLUE CROSS/BLUE SHIELD | Source: Ambulatory Visit | Attending: Internal Medicine | Admitting: Internal Medicine

## 2017-01-20 ENCOUNTER — Ambulatory Visit: Payer: Self-pay

## 2017-01-20 ENCOUNTER — Encounter: Payer: Self-pay | Admitting: Obstetrics & Gynecology

## 2017-01-20 DIAGNOSIS — I700 Atherosclerosis of aorta: Secondary | ICD-10-CM | POA: Diagnosis not present

## 2017-01-20 DIAGNOSIS — R1032 Left lower quadrant pain: Secondary | ICD-10-CM | POA: Diagnosis present

## 2017-01-20 DIAGNOSIS — K760 Fatty (change of) liver, not elsewhere classified: Secondary | ICD-10-CM | POA: Diagnosis not present

## 2017-01-20 DIAGNOSIS — J189 Pneumonia, unspecified organism: Secondary | ICD-10-CM | POA: Diagnosis not present

## 2017-01-20 DIAGNOSIS — N854 Malposition of uterus: Secondary | ICD-10-CM | POA: Insufficient documentation

## 2017-01-20 DIAGNOSIS — J479 Bronchiectasis, uncomplicated: Secondary | ICD-10-CM | POA: Insufficient documentation

## 2017-01-20 DIAGNOSIS — J441 Chronic obstructive pulmonary disease with (acute) exacerbation: Secondary | ICD-10-CM

## 2017-01-20 MED ORDER — IOPAMIDOL (ISOVUE-300) INJECTION 61%
75.0000 mL | Freq: Once | INTRAVENOUS | Status: AC | PRN
  Administered 2017-01-20: 75 mL via INTRAVENOUS

## 2017-01-20 NOTE — Telephone Encounter (Signed)
Notified that [facility] is calling with CT results; advised Agent to transfer call to the back line at [employer] at [facility], to directly notify the MD.

## 2017-01-24 ENCOUNTER — Telehealth: Payer: Self-pay | Admitting: Internal Medicine

## 2017-01-24 NOTE — Telephone Encounter (Signed)
Copied from [place] [id number]. Topic: Inquiry >> Jan 24, 2017 10:28 AM Arletha Grippe wrote: Reason for CRM: pt is calling to find out what results of CT scan are.  Please call pt on cell phone [phone]. Ok to ;eave detailed message if no answer.

## 2017-01-25 ENCOUNTER — Other Ambulatory Visit: Payer: Self-pay | Admitting: Internal Medicine

## 2017-01-25 ENCOUNTER — Telehealth: Payer: Self-pay | Admitting: Internal Medicine

## 2017-01-25 DIAGNOSIS — J411 Mucopurulent chronic bronchitis: Secondary | ICD-10-CM

## 2017-01-25 DIAGNOSIS — R911 Solitary pulmonary nodule: Secondary | ICD-10-CM

## 2017-01-25 NOTE — Telephone Encounter (Signed)
Copied from [place] #[id number]. Topic: Quick Communication - See Telephone Encounter >> Jan 25, 2017 10:30 AM Burnis Medin, NT wrote: CRM for notification. See Telephone encounter for pt. Pt. Called back. Pt. Would like a a call back.   01/25/17.

## 2017-01-25 NOTE — Telephone Encounter (Signed)
Pt is calling again requesting her results. Please try to give her a call on her cell.

## 2017-01-25 NOTE — Telephone Encounter (Signed)
CRM sent for nurse triage to give results

## 2017-01-25 NOTE — Telephone Encounter (Signed)
Spoke with pt. See result note message.  

## 2017-02-23 ENCOUNTER — Ambulatory Visit (INDEPENDENT_AMBULATORY_CARE_PROVIDER_SITE_OTHER): Payer: BLUE CROSS/BLUE SHIELD | Admitting: Obstetrics & Gynecology

## 2017-02-23 ENCOUNTER — Telehealth: Payer: Self-pay | Admitting: Internal Medicine

## 2017-02-23 ENCOUNTER — Encounter: Payer: Self-pay | Admitting: Obstetrics & Gynecology

## 2017-02-23 VITALS — Ht 66.0 in | Wt 131.0 lb

## 2017-02-23 DIAGNOSIS — Z780 Asymptomatic menopausal state: Secondary | ICD-10-CM | POA: Diagnosis not present

## 2017-02-23 DIAGNOSIS — M8580 Other specified disorders of bone density and structure, unspecified site: Secondary | ICD-10-CM

## 2017-02-23 DIAGNOSIS — Z01419 Encounter for gynecological examination (general) (routine) without abnormal findings: Secondary | ICD-10-CM | POA: Diagnosis not present

## 2017-02-23 NOTE — Progress Notes (Signed)
Barbara Odom March 07, 1951 [id number]   History:    66 y.o. G2P2 Married.  2 grand-children (in [place])  RP:  Established patient presenting for annual gyn exam   HPI: Menopausal on no hormone replacement therapy.  No postmenopausal bleeding.  No pelvic pain.  Normal vaginal secretions.  Sexually active with no problem.  Breasts normal.  Urine and bowel movements normal.  Health labs with family physician.  Past medical history,surgical history, family history and social history were all reviewed and documented in the EPIC chart.  Gynecologic History No LMP recorded. Patient is postmenopausal. Contraception: post menopausal status Last Pap: 01/2016. Results were: Negative/HPV HR Last mammogram: 2017. Results were: normal Bone density 07/2016:  Osteopenia Col[place] 2013  Obstetric History OB History  Gravida Para Term Preterm AB Living  2 2       2   SAB TAB Ectopic Multiple Live Births               # Outcome Date GA Lbr Len/2nd Weight Sex Delivery Anes PTL Lv  2 Para           1 Para                ROS: A ROS was performed and pertinent positives and negatives are included in the history.  GENERAL: No fevers or chills. HEENT: No change in vision, no earache, sore throat or sinus congestion. NECK: No pain or stiffness. CARDIOVASCULAR: No chest pain or pressure. No palpitations. PULMONARY: No shortness of breath, cough or wheeze. GASTROINTESTINAL: No abdominal pain, nausea, vomiting or diarrhea, melena or bright red blood per rectum. GENITOURINARY: No urinary frequency, urgency, hesitancy or dysuria. MUSCULOSKELETAL: No joint or muscle pain, no back pain, no recent trauma. DERMATOLOGIC: No rash, no itching, no lesions. ENDOCRINE: No polyuria, polydipsia, no heat or cold intolerance. No recent change in weight. HEMATOLOGICAL: No anemia or easy bruising or bleeding. NEUROLOGIC: No headache, seizures, numbness, tingling or weakness. PSYCHIATRIC: No depression, no loss of interest in  normal activity or change in sleep pattern.     Exam:   Ht 5\' 6"  (1.676 m)   Wt 131 lb (59.4 kg)   BMI 21.14 kg/m   Body mass index is 21.14 kg/m.  General appearance : Well developed well nourished female. No acute distress HEENT: Eyes: no retinal hemorrhage or exudates,  Neck supple, trachea midline, no carotid bruits, no thyroidmegaly Lungs: Clear to auscultation, no rhonchi or wheezes, or rib retractions  Heart: Regular rate and rhythm, no murmurs or gallops Breast:Examined in sitting and supine position were symmetrical in appearance, no palpable masses or tenderness,  no skin retraction, no nipple inversion, no nipple discharge, no skin discoloration, no axillary or supraclavicular lymphadenopathy Abdomen: no palpable masses or tenderness, no rebound or guarding Extremities: no edema or skin discoloration or tenderness  Pelvic: Vulva normal  Bartholin, Urethra, Skene Glands: Within normal limits             Vagina: No gross lesions or discharge  Cervix: No gross lesions or discharge.  Pap reflex done.  Uterus  AV, normal size, shape and consistency, non-tender and mobile  Adnexa  Without masses or tenderness  Anus and perineum  normal    Assessment/Plan:  66 y.o. female for annual exam   1. Encounter for routine gynecological examination with Papanicolaou smear of cervix Normal gynecologic exam.  Pap reflex done.  Breast exam normal.  Last screening mammogram 2017, will schedule at the breast center.  Colonoscopy  2013.  Health labs with family physician.  2. Menopause present Well on no hormone replacement therapy.  No postmenopausal bleeding.  3. Osteopenia, unspecified location Vitamin D supplements, calcium rich nutrition and weightbearing physical activity recommended.  Last bone density May 2018 showing osteopenia.  Princess Bruins MD, 5:02 PM 02/23/2017

## 2017-02-24 MED ORDER — PHENOBARBITAL 60 MG PO TABS: ORAL_TABLET | ORAL | 0 refills | Status: DC

## 2017-02-24 NOTE — Telephone Encounter (Signed)
Refilled: 08/23/2016 Last OV: 01/02/2017 Next OV: not scheduled

## 2017-02-24 NOTE — Telephone Encounter (Signed)
rx ok'd for phenobarbital #90 with no refills.

## 2017-02-26 NOTE — Patient Instructions (Signed)
1. Encounter for routine gynecological examination with Papanicolaou smear of cervix Normal gynecologic exam.  Pap reflex done.  Breast exam normal.  Last screening mammogram 2017, will schedule at the breast center.  Colonoscopy 2013.  Health labs with family physician.  2. Menopause present Well on no hormone replacement therapy.  No postmenopausal bleeding.  3. Osteopenia, unspecified location Vitamin D supplements, calcium rich nutrition and weightbearing physical activity recommended.  Last bone density May 2018 showing osteopenia.  Barbara Odom, it was a pleasure seeing you today!  I will inform you of your results as soon as possible.   Health Maintenance for Postmenopausal Women Menopause is a normal process in which your reproductive ability comes to an end. This process happens gradually over a span of months to years, usually between the ages of 20 and 27. Menopause is complete when you have missed 12 consecutive menstrual periods. It is important to talk with your health care provider about some of the most common conditions that affect postmenopausal women, such as heart disease, cancer, and bone loss (osteoporosis). Adopting a healthy lifestyle and getting preventive care can help to promote your health and wellness. Those actions can also lower your chances of developing some of these common conditions. What should I know about menopause? During menopause, you may experience a number of symptoms, such as:  Moderate-to-severe hot flashes.  Night sweats.  Decrease in sex drive.  Mood swings.  Headaches.  Tiredness.  Irritability.  Memory problems.  Insomnia.  Choosing to treat or not to treat menopausal changes is an individual decision that you make with your health care provider. What should I know about hormone replacement therapy and supplements? Hormone therapy products are effective for treating symptoms that are associated with menopause, such as hot flashes and  night sweats. Hormone replacement carries certain risks, especially as you become older. If you are thinking about using estrogen or estrogen with progestin treatments, discuss the benefits and risks with your health care provider. What should I know about heart disease and stroke? Heart disease, heart attack, and stroke become more likely as you age. This may be due, in part, to the hormonal changes that your body experiences during menopause. These can affect how your body processes dietary fats, triglycerides, and cholesterol. Heart attack and stroke are both medical emergencies. There are many things that you can do to help prevent heart disease and stroke:  Have your blood pressure checked at least every 1-2 years. High blood pressure causes heart disease and increases the risk of stroke.  If you are 55-67 years old, ask your health care provider if you should take aspirin to prevent a heart attack or a stroke.  Do not use any tobacco products, including cigarettes, chewing tobacco, or electronic cigarettes. If you need help quitting, ask your health care provider.  It is important to eat a healthy diet and maintain a healthy weight. ? Be sure to include plenty of vegetables, fruits, low-fat dairy products, and lean protein. ? Avoid eating foods that are high in solid fats, added sugars, or salt (sodium).  Get regular exercise. This is one of the most important things that you can do for your health. ? Try to exercise for at least 150 minutes each week. The type of exercise that you do should increase your heart rate and make you sweat. This is known as moderate-intensity exercise. ? Try to do strengthening exercises at least twice each week. Do these in addition to the moderate-intensity exercise.  Know your numbers.Ask your health care provider to check your cholesterol and your blood glucose. Continue to have your blood tested as directed by your health care provider.  What should I  know about cancer screening? There are several types of cancer. Take the following steps to reduce your risk and to catch any cancer development as early as possible. Breast Cancer  Practice breast self-awareness. ? This means understanding how your breasts normally appear and feel. ? It also means doing regular breast self-exams. Let your health care provider know about any changes, no matter how small.  If you are 55 or older, have a clinician do a breast exam (clinical breast exam or CBE) every year. Depending on your age, family history, and medical history, it may be recommended that you also have a yearly breast X-ray (mammogram).  If you have a family history of breast cancer, talk with your health care provider about genetic screening.  If you are at high risk for breast cancer, talk with your health care provider about having an MRI and a mammogram every year.  Breast cancer (BRCA) gene test is recommended for women who have family members with BRCA-related cancers. Results of the assessment will determine the need for genetic counseling and BRCA1 and for BRCA2 testing. BRCA-related cancers include these types: ? Breast. This occurs in males or females. ? Ovarian. ? Tubal. This may also be called fallopian tube cancer. ? Cancer of the abdominal or pelvic lining (peritoneal cancer). ? Prostate. ? Pancreatic.  Cervical, Uterine, and Ovarian Cancer Your health care provider may recommend that you be screened regularly for cancer of the pelvic organs. These include your ovaries, uterus, and vagina. This screening involves a pelvic exam, which includes checking for microscopic changes to the surface of your cervix (Pap test).  For women ages 21-65, health care providers may recommend a pelvic exam and a Pap test every three years. For women ages 9-65, they may recommend the Pap test and pelvic exam, combined with testing for human papilloma virus (HPV), every five years. Some types of  HPV increase your risk of cervical cancer. Testing for HPV may also be done on women of any age who have unclear Pap test results.  Other health care providers may not recommend any screening for nonpregnant women who are considered low risk for pelvic cancer and have no symptoms. Ask your health care provider if a screening pelvic exam is right for you.  If you have had past treatment for cervical cancer or a condition that could lead to cancer, you need Pap tests and screening for cancer for at least 20 years after your treatment. If Pap tests have been discontinued for you, your risk factors (such as having a new sexual partner) need to be reassessed to determine if you should start having screenings again. Some women have medical problems that increase the chance of getting cervical cancer. In these cases, your health care provider may recommend that you have screening and Pap tests more often.  If you have a family history of uterine cancer or ovarian cancer, talk with your health care provider about genetic screening.  If you have vaginal bleeding after reaching menopause, tell your health care provider.  There are currently no reliable tests available to screen for ovarian cancer.  Lung Cancer Lung cancer screening is recommended for adults 56-37 years old who are at high risk for lung cancer because of a history of smoking. A yearly low-dose CT scan of the  lungs is recommended if you:  Currently smoke.  Have a history of at least 30 pack-years of smoking and you currently smoke or have quit within the past 15 years. A pack-year is smoking an average of one pack of cigarettes per day for one year.  Yearly screening should:  Continue until it has been 15 years since you quit.  Stop if you develop a health problem that would prevent you from having lung cancer treatment.  Colorectal Cancer  This type of cancer can be detected and can often be prevented.  Routine colorectal cancer  screening usually begins at age 58 and continues through age 44.  If you have risk factors for colon cancer, your health care provider may recommend that you be screened at an earlier age.  If you have a family history of colorectal cancer, talk with your health care provider about genetic screening.  Your health care provider may also recommend using home test kits to check for hidden blood in your stool.  A small camera at the end of a tube can be used to examine your colon directly (sigmoidoscopy or colonoscopy). This is done to check for the earliest forms of colorectal cancer.  Direct examination of the colon should be repeated every 5-10 years until age 31. However, if early forms of precancerous polyps or small growths are found or if you have a family history or genetic risk for colorectal cancer, you may need to be screened more often.  Skin Cancer  Check your skin from head to toe regularly.  Monitor any moles. Be sure to tell your health care provider: ? About any new moles or changes in moles, especially if there is a change in a mole's shape or color. ? If you have a mole that is larger than the size of a pencil eraser.  If any of your family members has a history of skin cancer, especially at a young age, talk with your health care provider about genetic screening.  Always use sunscreen. Apply sunscreen liberally and repeatedly throughout the day.  Whenever you are outside, protect yourself by wearing long sleeves, pants, a wide-brimmed hat, and sunglasses.  What should I know about osteoporosis? Osteoporosis is a condition in which bone destruction happens more quickly than new bone creation. After menopause, you may be at an increased risk for osteoporosis. To help prevent osteoporosis or the bone fractures that can happen because of osteoporosis, the following is recommended:  If you are 58-43 years old, get at least 1,000 mg of calcium and at least 600 mg of vitamin D  per day.  If you are older than age 59 but younger than age 57, get at least 1,200 mg of calcium and at least 600 mg of vitamin D per day.  If you are older than age 51, get at least 1,200 mg of calcium and at least 800 mg of vitamin D per day.  Smoking and excessive alcohol intake increase the risk of osteoporosis. Eat foods that are rich in calcium and vitamin D, and do weight-bearing exercises several times each week as directed by your health care provider. What should I know about how menopause affects my mental health? Depression may occur at any age, but it is more common as you become older. Common symptoms of depression include:  Low or sad mood.  Changes in sleep patterns.  Changes in appetite or eating patterns.  Feeling an overall lack of motivation or enjoyment of activities that you previously  enjoyed.  Frequent crying spells.  Talk with your health care provider if you think that you are experiencing depression. What should I know about immunizations? It is important that you get and maintain your immunizations. These include:  Tetanus, diphtheria, and pertussis (Tdap) booster vaccine.  Influenza every year before the flu season begins.  Pneumonia vaccine.  Shingles vaccine.  Your health care provider may also recommend other immunizations. This information is not intended to replace advice given to you by your health care provider. Make sure you discuss any questions you have with your health care provider. Document Released: 04/29/2005 Document Revised: 09/25/2015 Document Reviewed: 12/09/2014 Elsevier Interactive Patient Education  2018 [employer].

## 2017-02-27 ENCOUNTER — Institutional Professional Consult (permissible substitution): Payer: BLUE CROSS/BLUE SHIELD | Admitting: Pulmonary Disease

## 2017-02-28 LAB — PAP IG W/ RFLX HPV ASCU

## 2017-03-03 ENCOUNTER — Ambulatory Visit (INDEPENDENT_AMBULATORY_CARE_PROVIDER_SITE_OTHER): Payer: BLUE CROSS/BLUE SHIELD | Admitting: Pulmonary Disease

## 2017-03-03 ENCOUNTER — Encounter: Payer: Self-pay | Admitting: Pulmonary Disease

## 2017-03-03 VITALS — BP 144/88 | HR 88 | Resp 16 | Ht 66.0 in | Wt 132.0 lb

## 2017-03-03 DIAGNOSIS — J302 Other seasonal allergic rhinitis: Secondary | ICD-10-CM | POA: Diagnosis not present

## 2017-03-03 DIAGNOSIS — R062 Wheezing: Secondary | ICD-10-CM

## 2017-03-03 DIAGNOSIS — R918 Other nonspecific abnormal finding of lung field: Secondary | ICD-10-CM

## 2017-03-03 NOTE — Patient Instructions (Signed)
Continue Qvar inhaler for now.  You may use only in the morning if you wish Lung function tests (PFTs) have been ordered Follow-up in 4-6 weeks to review PFTs We will get a repeat chest x-ray this summer and then annually after that There is no reason for repeat CT of the chest at this time

## 2017-03-06 LAB — HM COLONOSCOPY

## 2017-03-06 MED ORDER — DICYCLOMINE HCL 10 MG PO CAPS: 10.0000 mg | ORAL_CAPSULE | Freq: Three times a day (TID) | ORAL | 1 refills | Status: DC

## 2017-03-06 NOTE — Progress Notes (Signed)
PULMONARY CONSULT NOTE  Requesting MD/Service: Derrel Nip Date of initial consultation: 03/03/17 Reason for consultation: recent pneumonia, suspected asthma  PT PROFILE: 66 y.o. female with minimal smoking history (age 6-21) hospitalized briefly in July 2018 with dx of PNA. Reports persistent "wheezing" since then.   HPI:  As above.  When she initially presented in July, she had symptoms of pleuritic chest pain, fever, dyspnea and cough.  She was hospitalized for 2 days.  She was discharged home on levofloxacin.  She notes a "gradual" improvement since that time.  However, she has persistent wheezing.  She notes that she has "we used off and on" for years.  However, she is very active and walks every day.  She also participates in yoga.  She believes she has minimal exertional limitation due to dyspnea.  Presently, she denies CP, fever, purulent sputum, hemoptysis, LE edema and calf tenderness   DATA: CTA chest 10/07/16: Ill-defined opacities in RML, lingula, LUL and infrahilar region of RLL.  These opacities have a nodularity to them. CT chest 01/20/17: Overall, markedly improved opacities compared to prior study.  However, there remains minimal opacities and possible mild bronchiectasis in RML and lingula   Past Medical History:  Diagnosis Date  . 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 History of diverticulitis of colon   . Migraine headache   . Osteopenia   . Pneumonia    multi-lobe   . seasonal rhinitis     Past Surgical History:  Procedure Laterality Date  . BREAST BIOPSY Left 1997   Negative --- Pt states this procedure was a lumpect.  Marland Kitchen BREAST BIOPSY Right 2005   Negative  . BREAST SURGERY     lumpectomy, benign   . CARPAL TUNNEL RELEASE  1983   bilateral   . CERVICAL DISCECTOMY  August 2012   [place]    MEDICATIONS: I have reviewed all medications and confirmed regimen as documented  Social History   Socioeconomic History  . Marital status: Married    Spouse name: Not on file  . Number of children: Not on file  . Years of education: Not on file  . Highest education level: Not on file  Social Needs  . Financial resource strain: Not on file  . Food insecurity - worry: Not on file  . Food insecurity - inability: Not on file  . Transportation needs - medical: Not on file  . Transportation needs - non-medical: Not on file  Occupational History  . Occupation: BEHAVIOR MED    Employer: [employer]  Tobacco Use  . Smoking status: Never Smoker  . Smokeless tobacco: Never Used  Substance and Sexual Activity  . Alcohol use: Yes    Comment: socially   . Drug use: No  . Sexual activity: Yes    Partners: Male    Comment: 1st intercourse- 56, partners- 69, married- 32 yrs   Other Topics Concern  . Not on file  Social History Narrative  . Not on file    Family History  Problem Relation Age of Onset  . Heart disease Mother        Atrial fibrillation  . Cancer Maternal Grandmother        colon Ca  . Cancer Maternal Grandfather        Colon CA    ROS: No fever, myalgias/arthralgias, unexplained weight loss or weight gain No new focal weakness or sensory deficits No otalgia, hearing loss, visual changes, nasal and sinus  symptoms, mouth and throat problems No neck pain or adenopathy No abdominal pain, N/V/D, diarrhea, change in bowel pattern No dysuria, change in urinary pattern   Vitals:   03/03/17 1056 03/03/17 1059  BP:  (!) 144/88  Pulse:  88  Resp: 16   SpO2:  99%  Weight: 59.9 kg (132 lb)   Height: 5' 6"  (1.676 m)      EXAM:   Gen: WDWN in NAD HEENT: NCAT, sclerae white, oropharynx normal Neck: NO LAN, no JVD noted Lungs: full BS, normal percussion note throughout, no adventitious sounds Cardiovascular: RRR, no M noted Abdomen: Soft, NT, +BS Ext: no C/C/E Neuro: PERRL, EOMI, motor/sensory grossly intact Skin: No lesions noted   DATA:   BMP Latest Ref Rng & Units 12/14/2016 10/08/2016  10/07/2016  Glucose 70 - 99 mg/dL 91 109(H) 113(H)  BUN 6 - 23 mg/dL 11 9 13   Creatinine 0.40 - 1.20 mg/dL 0.78 0.67 0.84  Sodium 135 - 145 mEq/L 140 138 136  Potassium 3.5 - 5.1 mEq/L 3.6 3.9 3.3(L)  Chloride 96 - 112 mEq/L 101 106 102  CO2 19 - 32 mEq/L 30 26 26   Calcium 8.4 - 10.5 mg/dL 9.9 9.2 8.9    CBC Latest Ref Rng & Units 10/08/2016 10/07/2016 10/06/2016  WBC 3.6 - 11.0 K/uL 9.4 12.4(H) 11.3(H)  Hemoglobin 12.0 - 16.0 g/dL 13.4 12.4 13.1  Hematocrit 35.0 - 47.0 % 38.4 36.3 37.2  Platelets 150 - 440 K/uL 201 202 216    CXR 12/14/16:  city in the anterior lung base noted on the lateral view is similar to the most recent prior chest radiographs. This is consistent with the left upper lobe lingula and right middle lobe opacities noted on the chest CT dated 10/07/2016  IMPRESSION:     ICD-10-CM   1. Multiple pulmonary nodules R91.8   2. Wheezing -suspect asthma R06.2   3. Seasonal allergies J30.2    Her presentation and chest radiographs in July were certainly consistent with an acute infectious process.  She seems to have improved with antimicrobial therapy.  Her radiographs have improved significantly.  She has residual minimal abnormalities in the RML and lingula which are probably chronic.  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  No wheezing described with a history of seasonal allergies is suspicious for asthma.  If indeed she does have asthma, she is extremely well compensated with minimal exertional intolerance.  PLAN:  Continue Qvar inhaler for now.  You may use only in the morning if you wish Lung function tests (PFTs) have been ordered Follow-up in 4-6 weeks to review PFTs We will get a repeat chest x-ray this summer and then annually after that  There is no reason for repeat CT of the chest at this time. The very small pleural based RLL nodule does not warrant further evaluation as pt is  deemed "low risk"   Merton Border, MD PCCM service Mobile [phone] Pager [phone] 03/06/2017 3:16 PM

## 2017-03-06 NOTE — Telephone Encounter (Signed)
Pt would like to know why her dicyclomine (BENTYL) 10 MG capsule was denied??  Pt states she has taken this prn for years  (or Hyoscyamine)  [id number] [address], [place] [phone] (Phone) [phone] (Fax)   Pt would like a call back please.

## 2017-03-06 NOTE — Telephone Encounter (Signed)
rx has been sent in and pt has been notified.

## 2017-03-08 ENCOUNTER — Telehealth: Payer: Self-pay | Admitting: Internal Medicine

## 2017-03-08 ENCOUNTER — Telehealth: Payer: Self-pay | Admitting: Pulmonary Disease

## 2017-03-08 NOTE — Telephone Encounter (Signed)
Chart updated

## 2017-03-08 NOTE — Telephone Encounter (Signed)
lmov to schedule 5 wk fu per checkout with Dr. Alva Garnet  Due  03/31/2017

## 2017-03-09 NOTE — Telephone Encounter (Signed)
Looks like Dr. Derrel Nip order the PFT but I guess I can schedule it

## 2017-03-09 NOTE — Telephone Encounter (Signed)
Please see message below

## 2017-03-09 NOTE — Telephone Encounter (Signed)
I have Barbara Odom PFT Schedule for 03/28/2017 @ 8:30am and she is aware of the appt. She will be on vacation from 1/11 to 1/28 and will need to make an appt with Dr. Alva Garnet when [facility] schedule is opened

## 2017-03-09 NOTE — Telephone Encounter (Signed)
Pt states she needs to schedule a PFT. Please call to schedule.

## 2017-03-23 ENCOUNTER — Ambulatory Visit
Admission: RE | Admit: 2017-03-23 | Discharge: 2017-03-23 | Disposition: A | Discharge status: Home / Self Care | Payer: BLUE CROSS/BLUE SHIELD | Source: Ambulatory Visit | Attending: Pulmonary Disease | Admitting: Pulmonary Disease

## 2017-03-23 ENCOUNTER — Ambulatory Visit
Admission: RE | Admit: 2017-03-23 | Discharge: 2017-03-23 | Disposition: A | Discharge status: Home / Self Care | Payer: BLUE CROSS/BLUE SHIELD | Source: Ambulatory Visit | Attending: Internal Medicine | Admitting: Internal Medicine

## 2017-03-23 ENCOUNTER — Telehealth: Payer: Self-pay | Admitting: Pulmonary Disease

## 2017-03-23 DIAGNOSIS — R0602 Shortness of breath: Secondary | ICD-10-CM

## 2017-03-23 DIAGNOSIS — R918 Other nonspecific abnormal finding of lung field: Secondary | ICD-10-CM | POA: Diagnosis not present

## 2017-03-23 NOTE — Telephone Encounter (Signed)
Patient calling to see if Dr. Alva Garnet can see her for an acute visit recent pneumonia and feeling like she is going to get sick   Patient declined same day with ram as offered she wants simonds   Please advise

## 2017-03-23 NOTE — Telephone Encounter (Signed)
Spoke with pt and gave appt to see DR on 03/24/17 @ 10:15am. Per DR, pt needs CXR prior to appt. Order placed and [facility] for pt to call back to make sure she received the message to get CXR either today or tomorrow morning.

## 2017-03-23 NOTE — Telephone Encounter (Signed)
Per Lucienne Minks, pt called back stating that she received the message to get CXR prior to appt. Nothing further needed.

## 2017-03-23 NOTE — Progress Notes (Signed)
PULMONARY CONSULT NOTE  Requesting MD/Service: Derrel Nip Date of initial consultation: 03/03/17 Reason for consultation: recent pneumonia, suspected asthma  PT PROFILE: 67 y.o. female with minimal smoking history (age 15-21) hospitalized briefly in July 2018 with dx of PNA. Reports persistent "wheezing" since then.   Subjective:  She presents today with symptoms of dyspnea, cough. For the past 2 weeks she had "razors" in her throat, which then went into her throat.  She has pain when breathing, her voice is hoarse.  She has mild, occasional phlegm.  She has been having low grade fevers.  Her voice is hoarse.  At last visit she was cut down on qvar to 1 puff once daily. She takes no other inhalers, albuterol gives her palpitations. She is going to [place] in a week and is worried she might not be able to go.   Chest x-ray images personally reviewed, chest x-ray 03/23/17, no acute changes, hyperinflation consistent with emphysema.  DATA: CTA chest 10/07/16: Ill-defined opacities in RML, lingula, LUL and infrahilar region of RLL.  These opacities have a nodularity to them. CT chest 01/20/17: Overall, markedly improved opacities compared to prior study.  However, there remains minimal opacities and possible mild bronchiectasis in RML and lingula   MEDICATIONS: I have reviewed all medications and confirmed regimen as documented  ROS: No  myalgias/arthralgias, unexplained weight loss or weight gain No new focal weakness or sensory deficits No otalgia, hearing loss, visual changes, nasal and sinus symptoms, mouth and throat problems No neck pain or adenopathy No abdominal pain, N/V/D, diarrhea, change in bowel pattern No dysuria, change in urinary pattern   Vitals:   03/24/17 1011 03/24/17 1014  BP:  (!) 126/58  Pulse:  86  Resp: 16   SpO2:  98%  Weight: 131 lb (59.4 kg)   Height: 5\' 6"  (1.676 m)      EXAM:   Gen: WDWN in NAD HEENT: NCAT, sclerae white, oropharynx normal Neck: NO  LAN, no JVD noted Lungs: Scattered bilateral wheezing. Cardiovascular: RRR, no M noted Abdomen: Soft, NT, +BS Ext: no C/C/E Neuro: PERRL, EOMI, motor/sensory grossly intact Skin: No lesions noted   DATA:   BMP Latest Ref Rng & Units 12/14/2016 10/08/2016 10/07/2016  Glucose 70 - 99 mg/dL 91 109(H) 113(H)  BUN 6 - 23 mg/dL 11 9 13   Creatinine 0.40 - 1.20 mg/dL 0.78 0.67 0.84  Sodium 135 - 145 mEq/L 140 138 136  Potassium 3.5 - 5.1 mEq/L 3.6 3.9 3.3(L)  Chloride 96 - 112 mEq/L 101 106 102  CO2 19 - 32 mEq/L 30 26 26   Calcium 8.4 - 10.5 mg/dL 9.9 9.2 8.9    CBC Latest Ref Rng & Units 10/08/2016 10/07/2016 10/06/2016  WBC 3.6 - 11.0 K/uL 9.4 12.4(H) 11.3(H)  Hemoglobin 12.0 - 16.0 g/dL 13.4 12.4 13.1  Hematocrit 35.0 - 47.0 % 38.4 36.3 37.2  Platelets 150 - 440 K/uL 201 202 216    CXR 12/14/16:  city in the anterior lung base noted on the lateral view is similar to the most recent prior chest radiographs. This is consistent with the left upper lobe lingula and right middle lobe opacities noted on the chest CT dated 10/07/2016  IMPRESSION:   Acute bronchitis, likely started as a viral syndrome, may have been complicated by bacterial infection. Acute laryngitis. Acute respiratory failure.   PLAN:  Continue Qvar inhaler for now.   Azithromycin, prednisone taper. Once you have recovered from year episode of bronchitis she can proceed with pulmonary function  test and follow-up with Dr. Alva Garnet as previously planned. Lung function tests (PFTs) have been ordered      Deep Ashby Dawes, MD.   Board Certified in Internal Medicine, Pulmonary Medicine, [place], and Sleep Medicine.   Pulmonary and Critical Care Office Number: [phone] Pager: [phone]  Patricia Pesa, M.D.  Merton Border, M.D  03/23/2017 4:47 PM

## 2017-03-23 NOTE — Telephone Encounter (Signed)
LMOM for pt to return call. 

## 2017-03-24 ENCOUNTER — Ambulatory Visit: Payer: BLUE CROSS/BLUE SHIELD | Admitting: Internal Medicine

## 2017-03-24 ENCOUNTER — Encounter: Payer: Self-pay | Admitting: Internal Medicine

## 2017-03-24 VITALS — BP 126/58 | HR 86 | Resp 16 | Ht 66.0 in | Wt 131.0 lb

## 2017-03-24 DIAGNOSIS — J040 Acute laryngitis: Secondary | ICD-10-CM

## 2017-03-24 DIAGNOSIS — R0602 Shortness of breath: Secondary | ICD-10-CM | POA: Diagnosis not present

## 2017-03-24 DIAGNOSIS — J440 Chronic obstructive pulmonary disease with acute lower respiratory infection: Secondary | ICD-10-CM

## 2017-03-24 DIAGNOSIS — J209 Acute bronchitis, unspecified: Secondary | ICD-10-CM

## 2017-03-24 MED ORDER — AZITHROMYCIN 250 MG PO TABS: 250.0000 mg | ORAL_TABLET | Freq: Once | ORAL | 0 refills | Status: AC

## 2017-03-24 MED ORDER — PREDNISONE 10 MG (21) PO TBPK
ORAL_TABLET | ORAL | 0 refills | Status: DC
Start: 2017-03-24 — End: 2017-03-29

## 2017-03-24 NOTE — Patient Instructions (Addendum)
Will prescribe z pack and prednisone. Call us back if not feeling better by Monday.   Perform your PFT after you have recovered.

## 2017-03-27 ENCOUNTER — Ambulatory Visit: Payer: BLUE CROSS/BLUE SHIELD | Admitting: Family Medicine

## 2017-03-28 ENCOUNTER — Ambulatory Visit: Payer: BLUE CROSS/BLUE SHIELD

## 2017-03-28 ENCOUNTER — Ambulatory Visit: Payer: BLUE CROSS/BLUE SHIELD | Attending: Internal Medicine

## 2017-03-28 ENCOUNTER — Telehealth: Payer: Self-pay

## 2017-03-28 NOTE — Telephone Encounter (Signed)
Copied from [place] [id number]. Topic: Inquiry >> Mar 28, 2017  2:27 PM Oliver Pila B wrote: Reason for CRM: [name] called from [facility] called to ask about the referral order for the pulmonary function test, the form has questions on it that need to be filled out for them contact [place] @ [phone]

## 2017-03-28 NOTE — Telephone Encounter (Signed)
Called barbara back. Had to lvm for her to call me back

## 2017-03-28 NOTE — Progress Notes (Signed)
PT PROFILE: 67 y.o. female with minimal smoking history (age 47-21) hospitalized briefly in July 2018 with dx of PNA. Reports persistent "wheezing" since then.   Subjective:  She presents today with symptoms of dyspnea, cough. She was last seen about a week ago, she is doing better but continues to have hoarseness, she has pain in her right chest on inspiration. She completed the steroids course. She is going to go on a vacation which is remote location.  She is on qvar 1 puff twice daily, no other inhalers.  She has tried symbicort in the past, made it jittery but otherwise tolerable.   Chest x-ray images personally reviewed, chest x-ray 03/23/17, no acute changes, hyperinflation consistent with emphysema.  DATA: CTA chest 10/07/16: Ill-defined opacities in RML, lingula, LUL and infrahilar region of RLL.  These opacities have a nodularity to them. CT chest 01/20/17: Overall, markedly improved opacities compared to prior study.  However, there remains minimal opacities and possible mild bronchiectasis in RML and lingula   MEDICATIONS: I have reviewed all medications and confirmed regimen as documented  ROS: No  myalgias/arthralgias, unexplained weight loss or weight gain No new focal weakness or sensory deficits No otalgia, hearing loss, visual changes, nasal and sinus symptoms, mouth and throat problems No neck pain or adenopathy No abdominal pain, N/V/D, diarrhea, change in bowel pattern No dysuria, change in urinary pattern   Vitals:   03/29/17 0935 03/29/17 0939  BP:  118/68  Pulse:  85  SpO2:  98%  Weight: 135 lb (61.2 kg)   Height: 5\' 6"  (1.676 m)      EXAM:   Gen: WDWN in NAD HEENT: NCAT, sclerae white, oropharynx normal Neck: NO LAN, no JVD noted Lungs: Scattered bilateral wheezing. Cardiovascular: RRR, no M noted Abdomen: Soft, NT, +BS Ext: no C/C/E Neuro: PERRL, EOMI, motor/sensory grossly intact Skin: No lesions noted   DATA:   BMP Latest Ref Rng & Units  12/14/2016 10/08/2016 10/07/2016  Glucose 70 - 99 mg/dL 91 109(H) 113(H)  BUN 6 - 23 mg/dL 11 9 13   Creatinine 0.40 - 1.20 mg/dL 0.78 0.67 0.84  Sodium 135 - 145 mEq/L 140 138 136  Potassium 3.5 - 5.1 mEq/L 3.6 3.9 3.3(L)  Chloride 96 - 112 mEq/L 101 106 102  CO2 19 - 32 mEq/L 30 26 26   Calcium 8.4 - 10.5 mg/dL 9.9 9.2 8.9    CBC Latest Ref Rng & Units 10/08/2016 10/07/2016 10/06/2016  WBC 3.6 - 11.0 K/uL 9.4 12.4(H) 11.3(H)  Hemoglobin 12.0 - 16.0 g/dL 13.4 12.4 13.1  Hematocrit 35.0 - 47.0 % 38.4 36.3 37.2  Platelets 150 - 440 K/uL 201 202 216      IMPRESSION:   Acute bronchitis, likely started as a viral syndrome.  Has already completed a course of antibiotics Acute laryngitis. Acute respiratory failure.   PLAN:  She is given a sample of Breo inhaler today she is asked to use it for the next 2 weeks.  She should then return to her Qvar inhaler and used 2 puffs twice daily until her symptoms resolve. She is given an extended 12-day taper of prednisone. Once she has recovered from  episode of bronchitis she can proceed with pulmonary function test and follow-up with Dr. Alva Garnet as previously planned. Lung function tests (PFTs) have been ordered      Deep Ashby Dawes, MD.   Board Certified in Internal Medicine, Pulmonary Medicine, [place], and Sleep Medicine.  [facility] Office Number:  [phone] Pager: [phone]  Patricia Pesa, M.D.  Merton Border, M.D  03/28/2017 4:43 PM

## 2017-03-29 ENCOUNTER — Ambulatory Visit: Payer: BLUE CROSS/BLUE SHIELD | Admitting: Internal Medicine

## 2017-03-29 ENCOUNTER — Encounter: Payer: Self-pay | Admitting: Internal Medicine

## 2017-03-29 VITALS — BP 118/68 | HR 85 | Ht 66.0 in | Wt 135.0 lb

## 2017-03-29 DIAGNOSIS — J209 Acute bronchitis, unspecified: Secondary | ICD-10-CM

## 2017-03-29 DIAGNOSIS — J440 Chronic obstructive pulmonary disease with acute lower respiratory infection: Secondary | ICD-10-CM | POA: Diagnosis not present

## 2017-03-29 MED ORDER — FLUTICASONE FUROATE-VILANTEROL 200-25 MCG/INH IN AEPB: 1.0000 | INHALATION_SPRAY | Freq: Every day | RESPIRATORY_TRACT | 0 refills | Status: DC

## 2017-03-29 MED ORDER — PREDNISONE 10 MG PO TABS: ORAL_TABLET | ORAL | 0 refills | Status: DC

## 2017-03-29 MED ORDER — FLUCONAZOLE 150 MG PO TABS: 150.0000 mg | ORAL_TABLET | Freq: Every day | ORAL | 0 refills | Status: DC

## 2017-03-29 NOTE — Patient Instructions (Signed)
Take Breo sample one puff once daily. Stop qvar during that time, restart qvar 2 puffs twice per day, until feeling better after the Memory Dance is done.   Do pulmonary function test once you are feeling better.

## 2017-04-18 ENCOUNTER — Other Ambulatory Visit: Payer: Self-pay | Admitting: Internal Medicine

## 2017-04-18 DIAGNOSIS — G4700 Insomnia, unspecified: Secondary | ICD-10-CM

## 2017-04-18 NOTE — Telephone Encounter (Signed)
Okay to refill Ambien? Last written on 08/18/16 for #30 with no refills.  LOV: 01/02/17 NOV: not scheduled

## 2017-04-18 NOTE — Telephone Encounter (Signed)
See below (forgot to route message)

## 2017-04-19 NOTE — Telephone Encounter (Signed)
Faxed to [facility]

## 2017-04-24 ENCOUNTER — Other Ambulatory Visit: Payer: Self-pay | Admitting: Internal Medicine

## 2017-05-01 ENCOUNTER — Other Ambulatory Visit: Payer: Self-pay | Admitting: Obstetrics & Gynecology

## 2017-05-01 DIAGNOSIS — Z1231 Encounter for screening mammogram for malignant neoplasm of breast: Secondary | ICD-10-CM

## 2017-05-22 ENCOUNTER — Ambulatory Visit
Admission: RE | Admit: 2017-05-22 | Discharge: 2017-05-22 | Disposition: A | Discharge status: Home / Self Care | Payer: BLUE CROSS/BLUE SHIELD | Source: Ambulatory Visit | Attending: Obstetrics & Gynecology | Admitting: Obstetrics & Gynecology

## 2017-05-22 DIAGNOSIS — Z1231 Encounter for screening mammogram for malignant neoplasm of breast: Secondary | ICD-10-CM | POA: Diagnosis not present

## 2017-05-30 ENCOUNTER — Inpatient Hospital Stay
Admission: RE | Admit: 2017-05-30 | Discharge: 2017-05-30 | Disposition: A | Discharge status: Home / Self Care | Payer: Self-pay | Source: Ambulatory Visit | Attending: *Deleted | Admitting: *Deleted

## 2017-05-30 ENCOUNTER — Other Ambulatory Visit: Payer: Self-pay | Admitting: *Deleted

## 2017-05-30 DIAGNOSIS — Z9289 Personal history of other medical treatment: Secondary | ICD-10-CM

## 2017-06-03 ENCOUNTER — Other Ambulatory Visit: Payer: Self-pay | Admitting: Internal Medicine

## 2017-06-05 NOTE — Telephone Encounter (Signed)
Refilled: 02/24/2017 Last OV: 01/02/2017 Next OV: not scheduled

## 2017-06-06 NOTE — Telephone Encounter (Signed)
Printed, signed and faxed.  

## 2017-06-21 ENCOUNTER — Telehealth: Payer: Self-pay | Admitting: Pulmonary Disease

## 2017-06-21 NOTE — Telephone Encounter (Signed)
Pt saw Dr. Alva Garnet in Dec 2018.  Dr. Alva Garnet suggested that she have the PFT scheduled, however, since order was in from Dr. Derrel Nip we never scheduled it.   Pt is now wanting to schedule.  I believe the order is good for 6 months. I have left a message for [name] to return my call to get this scheduled for patient. Rhonda J Cobb

## 2017-06-21 NOTE — Telephone Encounter (Signed)
Called and spoke with [name] and she stated that PFT order was still good. Called and [facility] for pt of the above so no need to place 2nd order. Gave patient Barbara Odom's phone number of [phone] and advised her to call due to pt's work schedule.  Advised patient that if anything was needed from me to reach me at [phone]. Rhonda J Cobb

## 2017-06-23 NOTE — Telephone Encounter (Signed)
Spoke with [name] and she will also attempt to contact patient to arrange.  Gave patient Barbara's phone number and she was to contact [place] when she had her work schedule to arrange PFT on a day that was convenient for patient. As of today 06/23/17 no appointment has been scheduled. Once PFT has been scheduled, pt will need follow up appointment with Dr. Alva Garnet. [name] will also attempt to contact patient to arrange. Rhonda J Cobb

## 2017-08-29 ENCOUNTER — Ambulatory Visit: Payer: BLUE CROSS/BLUE SHIELD | Admitting: Internal Medicine

## 2017-08-29 ENCOUNTER — Ambulatory Visit
Admission: RE | Admit: 2017-08-29 | Discharge: 2017-08-29 | Disposition: A | Discharge status: Home / Self Care | Payer: BLUE CROSS/BLUE SHIELD | Source: Ambulatory Visit | Attending: Internal Medicine | Admitting: Internal Medicine

## 2017-08-29 ENCOUNTER — Other Ambulatory Visit: Payer: Self-pay | Admitting: *Deleted

## 2017-08-29 ENCOUNTER — Encounter: Payer: Self-pay | Admitting: Internal Medicine

## 2017-08-29 VITALS — BP 122/68 | HR 94 | Resp 16 | Ht 66.0 in | Wt 132.0 lb

## 2017-08-29 DIAGNOSIS — J440 Chronic obstructive pulmonary disease with acute lower respiratory infection: Secondary | ICD-10-CM

## 2017-08-29 DIAGNOSIS — R05 Cough: Secondary | ICD-10-CM

## 2017-08-29 DIAGNOSIS — R059 Cough, unspecified: Secondary | ICD-10-CM

## 2017-08-29 DIAGNOSIS — R918 Other nonspecific abnormal finding of lung field: Secondary | ICD-10-CM

## 2017-08-29 DIAGNOSIS — R0602 Shortness of breath: Secondary | ICD-10-CM | POA: Diagnosis not present

## 2017-08-29 DIAGNOSIS — J209 Acute bronchitis, unspecified: Secondary | ICD-10-CM

## 2017-08-29 MED ORDER — AZITHROMYCIN 250 MG PO TABS: 250.0000 mg | ORAL_TABLET | Freq: Every day | ORAL | 0 refills | Status: DC

## 2017-08-29 MED ORDER — PREDNISONE 10 MG (21) PO TBPK: ORAL_TABLET | ORAL | 0 refills | Status: DC

## 2017-08-29 NOTE — Progress Notes (Signed)
   PT PROFILE: 67 y.o. female with minimal smoking history (age 66-23) hospitalized briefly in July 2018 with dx of PNA.   Subjective:  Pt comes in today with dyspnea and right sided inspiratory chest pain. She had a low grade fever.  She is on qvar 1 puff twice daily, no other inhalers. She is on no albuterol because it caused her palpitations.   Chest x-ray images personally reviewed, chest x-ray 08/29/2017 in comparison with previous on 03/23/17, no acute changes, hyperinflation consistent with emphysema.  DATA: CTA chest 10/07/16: Ill-defined opacities in RML, lingula, LUL and infrahilar region of RLL.  These opacities have a nodularity to them. CT chest 01/20/17: Overall, markedly improved opacities compared to prior study.  However, there remains minimal opacities and possible mild bronchiectasis in RML and lingula   MEDICATIONS: I have reviewed all medications and confirmed regimen as documented  ROS: No  myalgias/arthralgias, unexplained weight loss or weight gain No new focal weakness or sensory deficits No otalgia, hearing loss, visual changes, nasal and sinus symptoms, mouth and throat problems No neck pain or adenopathy No abdominal pain, N/V/D, diarrhea, change in bowel pattern No dysuria, change in urinary pattern   Vitals:   08/29/17 1551 08/29/17 1554  BP:  122/68  Pulse:  94  Resp: 16   SpO2:  98%  Weight: 132 lb (59.9 kg)   Height: 5\' 6"  (1.676 m)      EXAM:   Gen: WDWN in NAD HEENT: NCAT, sclerae white, oropharynx normal Neck: NO LAN, no JVD noted Lungs: Scattered bilateral wheezing. Cardiovascular: RRR, no M noted Abdomen: Soft, NT, +BS Ext: no C/C/E Neuro: PERRL, EOMI, motor/sensory grossly intact Skin: No lesions noted   DATA:   BMP Latest Ref Rng & Units 12/14/2016 10/08/2016 10/07/2016  Glucose 70 - 99 mg/dL 91 109(H) 113(H)  BUN 6 - 23 mg/dL 11 9 13   Creatinine 0.40 - 1.20 mg/dL 0.78 0.67 0.84  Sodium 135 - 145 mEq/L 140 138 136  Potassium  3.5 - 5.1 mEq/L 3.6 3.9 3.3(L)  Chloride 96 - 112 mEq/L 101 106 102  CO2 19 - 32 mEq/L 30 26 26   Calcium 8.4 - 10.5 mg/dL 9.9 9.2 8.9    CBC Latest Ref Rng & Units 10/08/2016 10/07/2016 10/06/2016  WBC 3.6 - 11.0 K/uL 9.4 12.4(H) 11.3(H)  Hemoglobin 12.0 - 16.0 g/dL 13.4 12.4 13.1  Hematocrit 35.0 - 47.0 % 38.4 36.3 37.2  Platelets 150 - 440 K/uL 201 202 216      IMPRESSION:   Acute bronchitis, with scattered wheezing, possible COPD exacerbation. Pleuritic chest pain Multiple recent COPD exacerbations.  PLAN:  -We will give course of prednisone and azithromycin. - Can increase Qvar to twice daily. Once she has recovered from  episode of bronchitis she can proceed with pulmonary function test and follow-up with Dr. Alva Garnet as previously planned. Lung function tests (PFTs) have been ordered -Given her multiple recurrent exacerbations may benefit from further work-up.    Marda Stalker, MD.   Board Certified in Internal Medicine, Pulmonary Medicine, [place], and Sleep Medicine.  [facility] Office Number: [phone] Pager: [phone]  Patricia Pesa, M.D.  Merton Border, M.D  08/29/2017 12:22 PM

## 2017-08-29 NOTE — Patient Instructions (Signed)
Will start prednisone and azithromycin.   

## 2017-08-30 ENCOUNTER — Other Ambulatory Visit: Payer: Self-pay | Admitting: Internal Medicine

## 2017-09-02 ENCOUNTER — Other Ambulatory Visit: Payer: Self-pay | Admitting: Internal Medicine

## 2017-09-04 ENCOUNTER — Telehealth: Payer: Self-pay

## 2017-09-04 ENCOUNTER — Telehealth: Payer: Self-pay | Admitting: Internal Medicine

## 2017-09-04 ENCOUNTER — Other Ambulatory Visit: Payer: Self-pay | Admitting: Internal Medicine

## 2017-09-04 MED ORDER — PHENOBARBITAL 60 MG PO TABS: ORAL_TABLET | ORAL | 0 refills | Status: DC

## 2017-09-04 MED ORDER — AZITHROMYCIN 250 MG PO TABS: ORAL_TABLET | ORAL | 0 refills | Status: DC

## 2017-09-04 MED ORDER — FLUCONAZOLE 100 MG PO TABS: 100.0000 mg | ORAL_TABLET | Freq: Every day | ORAL | 0 refills | Status: AC

## 2017-09-04 NOTE — Telephone Encounter (Signed)
Can start azithromycin 250 mg every Monday, Wednesday, Friday for the duration of her trip. Can also give her the usual advice for avoidance of illness (hand hygiene, avoidance of sick people, etc).

## 2017-09-04 NOTE — Telephone Encounter (Signed)
Pt notified that she will stary Azith 250 3 times weekly. She asked for a rx diflucan which has been sent as well. Nothing further needed.

## 2017-09-04 NOTE — Telephone Encounter (Signed)
Pt is calling stating she is needing to get a call back  She is just about to finish her antibiotics  She is still not doing better  She states she is going out of the county for her sick father and is worried about being sick in  Another country  Would like a call back with advise on how to help her not be sick and or some advise on maybe being on antibiotics longer   Please call back

## 2017-09-04 NOTE — Telephone Encounter (Signed)
Copied from [place] [id number]. Topic: General - Other >> Sep 04, 2017  3:56 PM Carolyn Stare wrote:  Pt call to follow up on the below RX [facility] told [facility]'t [facility]    PHENObarbital (LUMINAL) 60 MG tablet

## 2017-09-04 NOTE — Telephone Encounter (Signed)
Spoke with pt to let her know that rx has been called in. Pt gave a verbal understanding.

## 2017-09-15 NOTE — Progress Notes (Addendum)
* [facility]     Assessment and Plan:  COPD with acute on chronic bronchitis. Multiple recent COPD exacerbations.  - Discussed further work-up to look into why of the patient has continued wheezing with multiple exacerbations and persistent coughing.  Would recommend repeat CT chest, CBC with diff, serum immunoglobulin levels, Rast and IgE testing.  Patient feels that she is doing significantly better, therefore would like to hold off on further work-up at this time.  She is agreeable to working things up further if she has continued symptoms.   Date: 09/18/2017  MRN# [id number] Barbara Odom 04-18-1950   Barbara Odom is a 67 y.o. old female seen in follow up for chief complaint of  Chief Complaint  Patient presents with  . Acute Visit    cough never resolved completely. cough with green mucus.  . Cough    PT PROFILE: 67 y.o. female with minimal smoking history (age 35-21) hospitalized briefly in July 2018 with dx of PNA.   Subjective:  The patient is a 66 year old female with history of COPD, she has had multiple recent episodes of COPD exacerbation.  Last visit she was given a course of prednisone and azithromycin. She has completed both and has been feeling better, but she has continued to have persistent cough. She is taking qvar twice daily. She has been on Breo and advair in the past and felt that they helped.  No pets at home.  Denies reflux, sinus drainage.   Chest x-ray images personally reviewed, chest x-ray 08/29/2017 in comparison with previous on 03/23/17, no acute changes, hyperinflation consistent with emphysema.  DATA:  Chest x-ray 08/29/2017>> imaging personally reviewed, hyperinflation consistent with COPD. CTA chest 10/07/16: Ill-defined opacities in RML, lingula, LUL and infrahilar region of RLL.  These opacities have a nodularity to them. CT chest 01/20/17: Overall, markedly improved opacities compared to prior study.  However, there  remains minimal opacities and possible mild bronchiectasis in RML and lingula CBC 12/14/2015>> absolute eosinophil count equals 300  DATA:   BMP Latest Ref Rng & Units 12/14/2016 10/08/2016 10/07/2016  Glucose 70 - 99 mg/dL 91 109(H) 113(H)  BUN 6 - 23 mg/dL 11 9 13   Creatinine 0.40 - 1.20 mg/dL 0.78 0.67 0.84  Sodium 135 - 145 mEq/L 140 138 136  Potassium 3.5 - 5.1 mEq/L 3.6 3.9 3.3(L)  Chloride 96 - 112 mEq/L 101 106 102  CO2 19 - 32 mEq/L 30 26 26   Calcium 8.4 - 10.5 mg/dL 9.9 9.2 8.9    CBC Latest Ref Rng & Units 10/08/2016 10/07/2016 10/06/2016  WBC 3.6 - 11.0 K/uL 9.4 12.4(H) 11.3(H)  Hemoglobin 12.0 - 16.0 g/dL 13.4 12.4 13.1  Hematocrit 35.0 - 47.0 % 38.4 36.3 37.2  Platelets 150 - 440 K/uL 201 202 216        Medication:    Current Outpatient Medications:  .  aspirin EC 81 MG tablet, Take 1 tablet (81 mg total) by mouth daily., Disp: 30 tablet, Rfl: 0 .  azithromycin (ZITHROMAX) 250 MG tablet, Take 1 tab every other day Mon, Wed, Fri., Disp: 30 tablet, Rfl: 0 .  beclomethasone (QVAR REDIHALER) 80 MCG/ACT inhaler, Inhale 1 puff into the lungs 2 (two) times daily., Disp: 10.6 g, Rfl: 3 .  calcium-vitamin D (OSCAL WITH D) 500-200 MG-UNIT tablet, Take 1 tablet by mouth 2 (two) times daily., Disp: , Rfl:  .  cetirizine (ZYRTEC) 10 MG tablet, Take 10 mg by mouth daily., Disp: , Rfl:  .  Cholecalciferol (VITAMIN D3) 1000 UNITS CAPS, Take by mouth., Disp: , Rfl:  .  Cranberry 500 MG TABS, Take 1 tablet by mouth daily., Disp: , Rfl:  .  diazepam (VALIUM) 5 MG tablet, TAKE 1 TABLET BY ORAL ROUTE EVERY 8 HOURS AS NEEDED, Disp: , Rfl: 1 .  dicyclomine (BENTYL) 10 MG capsule, Take 1 capsule (10 mg total) by mouth 4 (four) times daily -  before meals and at bedtime., Disp: 120 capsule, Rfl: 1 .  fluconazole (DIFLUCAN) 100 MG tablet, Take 1 tablet (100 mg total) by mouth daily., Disp: 1 tablet, Rfl: 0 .  magnesium oxide (MAG-OX) 400 MG tablet, Take 400 mg by mouth daily., Disp: , Rfl:  .   Multiple Vitamin (MULTIVITAMIN) tablet, Take 1 tablet by mouth daily.  , Disp: , Rfl:  .  PHENObarbital (LUMINAL) 60 MG tablet, TAKE 1 TABLET BY MOUTH EVERY DAY, Disp: 90 tablet, Rfl: 0 .  predniSONE (STERAPRED UNI-PAK 21 TAB) 10 MG (21) TBPK tablet, Take as directed., Disp: 21 tablet, Rfl: 0 .  Probiotic Product (SOLUBLE FIBER/PROBIOTICS PO), Take 1 capsule by mouth daily., Disp: , Rfl:  .  rizatriptan (MAXALT) 10 MG tablet, Take 1 tablet (10 mg total) by mouth as needed for migraine. May repeat in 2 hours if needed, Disp: 30 tablet, Rfl: 3 .  rizatriptan (MAXALT) 10 MG tablet, TAKE 1 TABLET BY MOUTH AS NEEDED FOR MIGRAINE. MAY REPEAT IN 2 HOURS IF NEEDED, Disp: 30 tablet, Rfl: 2 .  zolpidem (AMBIEN) 10 MG tablet, TAKE 1/2 TABLET BY MOUTH AT BEDTIME, Disp: 30 tablet, Rfl: 5   Allergies:  Penicillins  Review of Systems:  Constitutional: Feels well. Cardiovascular: No chest pain.  Pulmonary: Denies dyspnea.   The remainder of systems were reviewed and were found to be negative other than what is documented in the HPI.   Physical Examination:   VS: BP 128/70 (BP Location: Left Arm, Cuff Size: Large)   Pulse 90   Ht 5\' 6"  (1.676 m)   SpO2 96%   BMI 21.31 kg/m   General Appearance: No distress  Neuro:without focal findings, mental status, speech normal, alert and oriented HEENT: PERRLA, EOM intact Pulmonary: No wheezing, No rales scattered bilateral wheezing. CardiovascularNormal S1,S2.  No m/r/g.  Abdomen: Benign, Soft, non-tender, No masses Renal:  No costovertebral tenderness  GU:  No performed at this time. Endoc: No evident thyromegaly, no signs of acromegaly or Cushing features Skin:   warm, no rashes, no ecchymosis  Extremities: normal, no cyanosis, clubbing.     LABORATORY PANEL:   CBC No results for input(s): WBC, HGB, HCT, PLT in the last 168  hours. ------------------------------------------------------------------------------------------------------------------  Chemistries  No results for input(s): NA, K, CL, CO2, GLUCOSE, BUN, CREATININE, CALCIUM, MG, AST, ALT, ALKPHOS, BILITOT in the last 168 hours.  Invalid input(s): GFRCGP ------------------------------------------------------------------------------------------------------------------  Cardiac Enzymes No results for input(s): TROPONINI in the last 168 hours. ------------------------------------------------------------  RADIOLOGY:   No results found for this or any previous visit. Results for orders placed during the hospital encounter of 08/29/17  DG Chest 2 View   Narrative CLINICAL DATA:  Short of breath dry cough  EXAM: CHEST - 2 VIEW  COMPARISON:  03/23/2017  FINDINGS: Normal cardiac silhouette . Lungs are hyperinflated. No focal consolidation. No pneumothorax. No pulmonary edema. Anterior cervical fusion noted.  IMPRESSION: Hyperinflated lungs with no acute findings.   Electronically Signed   By: Suzy Bouchard M.D.   On: 08/30/2017 08:19    ------------------------------------------------------------------------------------------------------------------  Thank  you for allowing [facility] to assist in the care of your patient. Our recommendations are noted above.  Please contact us if we can be of further service.   Marda Stalker, M.D., F.C.C.P.  Board Certified in Internal Medicine, Pulmonary Medicine, [place], and Sleep Medicine.  [facility] Office Number: [phone]  09/18/2017

## 2017-09-18 ENCOUNTER — Encounter: Payer: Self-pay | Admitting: Internal Medicine

## 2017-09-18 ENCOUNTER — Ambulatory Visit: Payer: BLUE CROSS/BLUE SHIELD | Admitting: Internal Medicine

## 2017-09-18 VITALS — BP 128/70 | HR 90 | Ht 66.0 in

## 2017-09-18 DIAGNOSIS — R05 Cough: Secondary | ICD-10-CM | POA: Diagnosis not present

## 2017-09-18 DIAGNOSIS — J209 Acute bronchitis, unspecified: Secondary | ICD-10-CM

## 2017-09-18 DIAGNOSIS — J189 Pneumonia, unspecified organism: Secondary | ICD-10-CM

## 2017-09-18 DIAGNOSIS — R059 Cough, unspecified: Secondary | ICD-10-CM

## 2017-09-18 DIAGNOSIS — J440 Chronic obstructive pulmonary disease with acute lower respiratory infection: Secondary | ICD-10-CM

## 2017-09-18 MED ORDER — FLUTICASONE FUROATE-VILANTEROL 200-25 MCG/INH IN AEPB: 1.0000 | INHALATION_SPRAY | Freq: Every day | RESPIRATORY_TRACT | 5 refills | Status: DC

## 2017-09-18 NOTE — Patient Instructions (Addendum)
Will change from qvar to breo, take one puff once daily, rinse mouth after use.

## 2017-09-22 ENCOUNTER — Other Ambulatory Visit
Admission: RE | Admit: 2017-09-22 | Discharge: 2017-09-22 | Disposition: A | Discharge status: Home / Self Care | Payer: BLUE CROSS/BLUE SHIELD | Source: Ambulatory Visit | Attending: Internal Medicine | Admitting: Internal Medicine

## 2017-09-22 DIAGNOSIS — J209 Acute bronchitis, unspecified: Secondary | ICD-10-CM | POA: Insufficient documentation

## 2017-09-25 ENCOUNTER — Telehealth: Payer: Self-pay | Admitting: Internal Medicine

## 2017-09-25 DIAGNOSIS — J440 Chronic obstructive pulmonary disease with acute lower respiratory infection: Secondary | ICD-10-CM

## 2017-09-25 LAB — EXPECTORATED SPUTUM ASSESSMENT W REFEX TO RESP CULTURE

## 2017-09-25 LAB — EXPECTORATED SPUTUM ASSESSMENT W GRAM STAIN, RFLX TO RESP C

## 2017-09-25 NOTE — Telephone Encounter (Signed)
[facility] calling stating the culture patient dropped of on Friday was unacceptable  Stating that there was to much saliva  May need to do another test  [facility]

## 2017-09-25 NOTE — Telephone Encounter (Addendum)
Spoke with patient-states she has a hard time coughing up phlegm. Pt is aware to come by lab and get new sputum cup and hold until she feels that she can get a good sputum sample. Pt will contact the office when she has dropped new sample off.   New order placed.

## 2017-09-26 ENCOUNTER — Telehealth: Payer: Self-pay | Admitting: Internal Medicine

## 2017-09-26 DIAGNOSIS — R0600 Dyspnea, unspecified: Secondary | ICD-10-CM

## 2017-09-26 NOTE — Addendum Note (Signed)
Addended by: Laverle Hobby on: 09/26/2017 07:47 PM   Modules accepted: Orders

## 2017-09-26 NOTE — Telephone Encounter (Signed)
Patient came by office States she is begging for Levaquin medication to help with constant cough Rarely has had a chest infection to respond to zpack and also does not like taking steriods Please call to discuss

## 2017-09-26 NOTE — Telephone Encounter (Signed)
Pt declined further workup at last visit, will need to proceed with that now.  Ordered CBC, sputum, immunoglobulin levels.  Please place order for RAST testing.   Should do these tests only after being off antihistamine, steroid, antibiotics for at least one week.  Follow up after results are available.

## 2017-09-26 NOTE — Telephone Encounter (Signed)
Please read previous message:  We will give course of prednisone and azithromycin. - Can increase Qvar to twice daily. Once she has recovered from  episode of bronchitis she can proceed with pulmonary function test and follow-up with Dr. Alva Garnet as previously planned. Lung function tests (PFTs) have been ordered -Given her multiple recurrent exacerbations may benefit from further work-up.

## 2017-09-27 ENCOUNTER — Other Ambulatory Visit
Admission: RE | Admit: 2017-09-27 | Discharge: 2017-09-27 | Disposition: A | Discharge status: Home / Self Care | Payer: BLUE CROSS/BLUE SHIELD | Source: Ambulatory Visit | Attending: Internal Medicine | Admitting: Internal Medicine

## 2017-09-27 NOTE — Telephone Encounter (Signed)
Pt advised. Rast test ordered. Pt completed a sputum on 7/4 that was not accurate. She has another sample to drop of later today. She was advised to call pcp if symptoms get worse.

## 2017-09-28 ENCOUNTER — Telehealth: Payer: Self-pay | Admitting: Internal Medicine

## 2017-09-28 DIAGNOSIS — R059 Cough, unspecified: Secondary | ICD-10-CM

## 2017-09-28 DIAGNOSIS — R05 Cough: Secondary | ICD-10-CM

## 2017-09-28 NOTE — Telephone Encounter (Signed)
There was not substance in the cup that was submitted, needs a new order

## 2017-09-28 NOTE — Telephone Encounter (Signed)
New order entered

## 2017-09-29 ENCOUNTER — Encounter: Payer: Self-pay | Admitting: Emergency Medicine

## 2017-09-29 ENCOUNTER — Other Ambulatory Visit: Payer: Self-pay

## 2017-09-29 ENCOUNTER — Ambulatory Visit (INDEPENDENT_AMBULATORY_CARE_PROVIDER_SITE_OTHER): Payer: BLUE CROSS/BLUE SHIELD

## 2017-09-29 ENCOUNTER — Ambulatory Visit: Payer: BLUE CROSS/BLUE SHIELD | Admitting: Family

## 2017-09-29 ENCOUNTER — Ambulatory Visit
Admission: EM | Admit: 2017-09-29 | Discharge: 2017-09-29 | Disposition: A | Discharge status: Home / Self Care | Payer: BLUE CROSS/BLUE SHIELD | Attending: Emergency Medicine | Admitting: Emergency Medicine

## 2017-09-29 DIAGNOSIS — J189 Pneumonia, unspecified organism: Secondary | ICD-10-CM | POA: Diagnosis not present

## 2017-09-29 DIAGNOSIS — R05 Cough: Secondary | ICD-10-CM

## 2017-09-29 DIAGNOSIS — R059 Cough, unspecified: Secondary | ICD-10-CM

## 2017-09-29 MED ORDER — BENZONATATE 200 MG PO CAPS: 200.0000 mg | ORAL_CAPSULE | Freq: Three times a day (TID) | ORAL | 0 refills | Status: DC | PRN

## 2017-09-29 MED ORDER — HYDROCOD POLST-CPM POLST ER 10-8 MG/5ML PO SUER: 5.0000 mL | Freq: Two times a day (BID) | ORAL | 0 refills | Status: DC | PRN

## 2017-09-29 MED ORDER — LEVOFLOXACIN 750 MG PO TABS: 750.0000 mg | ORAL_TABLET | Freq: Every day | ORAL | 0 refills | Status: AC

## 2017-09-29 NOTE — ED Triage Notes (Signed)
Patient c/o cough and congestion since middle of June.  Patient has been on a Z-Pack and Prednisone and has not improved. Patient reports low grade fever in the evening.

## 2017-09-29 NOTE — Discharge Instructions (Addendum)
You will have to defer your work-up.  Let your pulmonologist know that today's days chest x-ray was concerning for pneumonia so we started you on Levaquin for 5 days.  Tessalon for the cough during the day, Tussionex for the cough at night.

## 2017-09-29 NOTE — ED Provider Notes (Signed)
HPI  SUBJECTIVE:  Barbara Odom is a 67 y.o. female who presents with over a month of a cough.  Patient states that she finished a course of azithromycin and prednisone on 6/19 for a COPD exacerbation and states that her symptoms largely improved but that her cough is getting worse.  She states that her cough is occasionally productive of light yellow sputum.  And has not changed in color, amount or increase in purulence above baseline.  Patient states that she cannot function, especially cannot sleep at night due to the cough.  No fevers, wheezing, chest pain, shortness of breath, dyspnea on exertion.  No nasal congestion, rhinorrhea, PND, GERD, allergy symptoms.  She tried a leftover prescription of Tussionex which helps her sleep at night, throat lozenges, tea and honey.  Symptoms worse with exertion.  The past medical history of COPD, seizures as a teenager.  No seizures since age 22.  Also allergies.  No history of GERD, CHF, hypertension, ACE inhibitor use.  States that she is currently not taking the Zyrtec.  IWP:YKDXI, Aris Everts, MD Pulmonology: Dr. Ardell Isaacs.  Patient was seen at [facility] pulmonology earlier this month, on 7/1 for a cough and multiple recurrent COPD exacerbations.  She was requesting Levaquin for her cough on 7/9, however chart review states that she was started on prednisone and azithromycin, Qvar was increased to twice daily on 7/9 for bronchitis. CBC, sputum, immunoglobulin levels and RAST testing ordered.  Plan was to follow-up with Dr. Alva Garnet. Patient states that she was never started on the azithromycin, prednisone, she states that she is not on the Qvar but is taking Brio Ellipta only instead. She states that she has turned in a sputum collection on 7/10.  Past Medical History:  Diagnosis Date  . 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 History of diverticulitis of colon   . Migraine headache   . Osteopenia   . Pneumonia    multi-lobe   .  seasonal rhinitis     Past Surgical History:  Procedure Laterality Date  . BREAST BIOPSY Right 2005   Negative- clip placed  . BREAST EXCISIONAL BIOPSY Left 1997   Negative --- Pt states this procedure was a lumpect.  Marland Kitchen BREAST SURGERY     lumpectomy, benign   . CARPAL TUNNEL RELEASE  1983   bilateral   . CERVICAL DISCECTOMY  August 2012   [place]    Family History  Problem Relation Age of Onset  . Heart disease Mother        Atrial fibrillation  . Cancer Maternal Grandmother        colon Ca  . Cancer Maternal Grandfather        Colon CA    Social History   Tobacco Use  . Smoking status: Never Smoker  . Smokeless tobacco: Never Used  Substance Use Topics  . Alcohol use: Yes    Comment: socially   . Drug use: No    No current facility-administered medications for this encounter.   Current Outpatient Medications:  .  aspirin EC 81 MG tablet, Take 1 tablet (81 mg total) by mouth daily., Disp: 30 tablet, Rfl: 0 .  calcium-vitamin D (OSCAL WITH D) 500-200 MG-UNIT tablet, Take 1 tablet by mouth 2 (two) times daily., Disp: , Rfl:  .  fluticasone furoate-vilanterol (BREO ELLIPTA) 200-25 MCG/INH AEPB, Inhale 1 puff into the lungs daily., Disp: 1 each, Rfl: 5 .  magnesium oxide (MAG-OX) 400 MG tablet,  Take 400 mg by mouth daily., Disp: , Rfl:  .  Multiple Vitamin (MULTIVITAMIN) tablet, Take 1 tablet by mouth daily.  , Disp: , Rfl:  .  PHENObarbital (LUMINAL) 60 MG tablet, TAKE 1 TABLET BY MOUTH EVERY DAY, Disp: 90 tablet, Rfl: 0 .  Probiotic Product (SOLUBLE FIBER/PROBIOTICS PO), Take 1 capsule by mouth daily., Disp: , Rfl:  .  benzonatate (TESSALON) 200 MG capsule, Take 1 capsule (200 mg total) by mouth 3 (three) times daily as needed for cough., Disp: 30 capsule, Rfl: 0 .  chlorpheniramine-HYDROcodone (TUSSIONEX PENNKINETIC ER) 10-8 MG/5ML SUER, Take 5 mLs by mouth every 12 (twelve) hours as needed for cough., Disp: 120 mL, Rfl: 0 .  Cholecalciferol (VITAMIN D3) 1000 UNITS  CAPS, Take by mouth., Disp: , Rfl:  .  Cranberry 500 MG TABS, Take 1 tablet by mouth daily., Disp: , Rfl:  .  dicyclomine (BENTYL) 10 MG capsule, Take 1 capsule (10 mg total) by mouth 4 (four) times daily -  before meals and at bedtime., Disp: 120 capsule, Rfl: 1 .  fluconazole (DIFLUCAN) 100 MG tablet, Take 1 tablet (100 mg total) by mouth daily., Disp: 1 tablet, Rfl: 0 .  levofloxacin (LEVAQUIN) 750 MG tablet, Take 1 tablet (750 mg total) by mouth daily for 5 days., Disp: 5 tablet, Rfl: 0 .  rizatriptan (MAXALT) 10 MG tablet, Take 1 tablet (10 mg total) by mouth as needed for migraine. May repeat in 2 hours if needed, Disp: 30 tablet, Rfl: 3  Allergies  Allergen Reactions  . Penicillins Other (See Comments)    Reaction occurred as a child; "mom described something that sounds like bronchospasms"     ROS  As noted in HPI.   Physical Exam  BP (!) 142/66 (BP Location: Left Arm)   Pulse 91   Temp 98.4 F (36.9 C) (Oral)   Resp 14   Ht 5\' 7"  (1.702 m)   Wt 132 lb (59.9 kg)   SpO2 100%   BMI 20.67 kg/m   Constitutional: Well developed, well nourished, no acute distress Eyes:  EOMI, conjunctiva normal bilaterally HENT: Normocephalic, atraumatic,mucus membranes moist Respiratory: Normal inspiratory effort.  Fair air movement.  Wheezing upper lobes bilaterally, wheezing right lower lobe.  No rales, rhonchi. Cardiovascular: Normal rate, regular rhythm, no murmurs, rubs, gallops GI: nondistended skin: No rash, skin intact Musculoskeletal: no deformities Neurologic: Alert & oriented x 3, no focal neuro deficits Psychiatric: Speech and behavior appropriate   ED Course   Medications - No data to display  Orders Placed This Encounter  Procedures  . DG Chest 2 View    Standing Status:   Standing    Number of Occurrences:   1    Order Specific Question:   Reason for Exam (SYMPTOM  OR DIAGNOSIS REQUIRED)    Answer:   h/o COPD, cough x 1 month r/o PNA, effusion, pulm edema, acute  changes.    No results found for this or any previous visit (from the past 24 hour(s)). Dg Chest 2 View  Result Date: 09/29/2017 CLINICAL DATA:  Cough and congestion since mid June, 2019. EXAM: CHEST - 2 VIEW COMPARISON:  PA and lateral chest 08/29/2017, 03/23/2017 and 12/14/2016. FINDINGS: Focal opacities are seen in the lingula and right middle lobe. These have been present on prior examinations but are increased on today's study. The lungs are otherwise clear. Heart size is normal. No pneumothorax or pleural effusion. No acute or focal bony abnormality. IMPRESSION: Chronic lingular and right middle  lobe opacities are worse on today's study compared to prior exams and may represent pneumonia superimposed on chronic atelectasis or scar. Recommend repeat PA and lateral chest films in 4-6 weeks to ensure resolution. Electronically Signed   By: Inge Rise M.D.   On: 09/29/2017 13:08    ED Clinical Impression  Cough  Pneumonia of both lungs due to infectious organism, unspecified part of lung   ED Assessment/Plan  Outside records reviewed.  As noted in HPI.  Reviewed imaging independently.  Focal opacities in the lingula and right middle lobe.  Increased on today's study.  Radiology suspects pneumonia superimposed on chronic atelectasis or scar and recommends repeat chest films in 4 to 6 weeks see radiology report for full details.  [place] Narcotic database reviewed for this patient, and feel that the risk/benefit ratio today is favorable for proceeding with a prescription for controlled substance.  Last opiate prescription was in November 2018.  We will send home with Tussionex, Tessalon, and discussed with patient that if I prescribe her steroids and/or and antibiotic, then she will have to defer her COPD work-up for another time.  She is fine with this.  Given that she does have worsening opacities on today's x-ray concerning for pneumonia, will send her home on Levaquin 750 mg for 5  days.  Vitals are otherwise normal, afebrile.  Feel that she is stable to attempt outpatient treatment for pneumonia.  She will need to follow-up with her pulmonologist ASAP.  Discussed imaging, MDM, treatment plan, and plan for follow-up with patient. Discussed sn/sx that should prompt return to the ED. patient agrees with plan.   Meds ordered this encounter  Medications  . chlorpheniramine-HYDROcodone (TUSSIONEX PENNKINETIC ER) 10-8 MG/5ML SUER    Sig: Take 5 mLs by mouth every 12 (twelve) hours as needed for cough.    Dispense:  120 mL    Refill:  0  . benzonatate (TESSALON) 200 MG capsule    Sig: Take 1 capsule (200 mg total) by mouth 3 (three) times daily as needed for cough.    Dispense:  30 capsule    Refill:  0  . levofloxacin (LEVAQUIN) 750 MG tablet    Sig: Take 1 tablet (750 mg total) by mouth daily for 5 days.    Dispense:  5 tablet    Refill:  0    *This clinic note was created using L[employer]. Therefore, there may be occasional mistakes despite careful proofreading.   ?   Melynda Ripple, MD 09/29/17 1345

## 2017-10-02 ENCOUNTER — Telehealth: Payer: Self-pay | Admitting: Pulmonary Disease

## 2017-10-02 NOTE — Telephone Encounter (Signed)
Pt advised to do allergy testing 1 week after completing Levaquin.

## 2017-10-02 NOTE — Telephone Encounter (Signed)
Pt would like to discuss lab she is to have on the 17th. Please call.

## 2017-10-11 ENCOUNTER — Other Ambulatory Visit
Admission: RE | Admit: 2017-10-11 | Discharge: 2017-10-11 | Disposition: A | Discharge status: Home / Self Care | Payer: BLUE CROSS/BLUE SHIELD | Source: Ambulatory Visit | Attending: Internal Medicine | Admitting: Internal Medicine

## 2017-10-11 DIAGNOSIS — J189 Pneumonia, unspecified organism: Secondary | ICD-10-CM | POA: Diagnosis present

## 2017-10-11 DIAGNOSIS — R05 Cough: Secondary | ICD-10-CM | POA: Diagnosis present

## 2017-10-11 DIAGNOSIS — J440 Chronic obstructive pulmonary disease with acute lower respiratory infection: Secondary | ICD-10-CM | POA: Insufficient documentation

## 2017-10-11 DIAGNOSIS — R059 Cough, unspecified: Secondary | ICD-10-CM

## 2017-10-11 DIAGNOSIS — J209 Acute bronchitis, unspecified: Secondary | ICD-10-CM

## 2017-10-12 LAB — IGG, IGA, IGM
IGA: 342 mg/dL (ref 87–352)
IGM (IMMUNOGLOBULIN M), SRM: 193 mg/dL (ref 26–217)
IgG (Immunoglobin G), Serum: 1218 mg/dL (ref 700–1600)

## 2017-10-16 ENCOUNTER — Other Ambulatory Visit: Payer: Self-pay | Admitting: *Deleted

## 2017-10-16 ENCOUNTER — Ambulatory Visit: Payer: BLUE CROSS/BLUE SHIELD | Admitting: Pulmonary Disease

## 2017-10-16 VITALS — BP 126/72 | HR 80 | Ht 67.0 in | Wt 130.0 lb

## 2017-10-16 DIAGNOSIS — J479 Bronchiectasis, uncomplicated: Secondary | ICD-10-CM

## 2017-10-16 DIAGNOSIS — R05 Cough: Secondary | ICD-10-CM

## 2017-10-16 DIAGNOSIS — R059 Cough, unspecified: Secondary | ICD-10-CM

## 2017-10-16 MED ORDER — DOXYCYCLINE HYCLATE 100 MG PO TABS: 100.0000 mg | ORAL_TABLET | Freq: Two times a day (BID) | ORAL | 3 refills | Status: DC

## 2017-10-16 MED ORDER — ALBUTEROL SULFATE HFA 108 (90 BASE) MCG/ACT IN AERS: 1.0000 | INHALATION_SPRAY | RESPIRATORY_TRACT | 10 refills | Status: DC | PRN

## 2017-10-16 NOTE — Patient Instructions (Addendum)
Albuterol inhaler, 1-2 actuations as needed for increased cough, wheezing, chest tightness, shortness of breath  Doxycycline, 100 mg twice a day for 5 days as needed for symptoms of respiratory tract infection (fever, purulent sputum, increased shortness of breath)  Follow-up as needed

## 2017-10-17 ENCOUNTER — Ambulatory Visit: Payer: BLUE CROSS/BLUE SHIELD | Admitting: Pulmonary Disease

## 2017-10-18 ENCOUNTER — Encounter: Payer: Self-pay | Admitting: Pulmonary Disease

## 2017-10-18 NOTE — Progress Notes (Signed)
ACUTE PULMONARY OFFICE VISIT  CHRONIC PULMONARY PROBLEMS: COPD with frequent exacerbations  ACUTE PROBLEM: Seen recently at urgent care center with diagnosis of pneumonia.   SUBJ: This is a patient of Dr. Ashby Dawes and was last seen in this office 09/18/2017.  She was seen at urgent care center 09/29/2017 with persistent cough, mildly purulent mucus and increased shortness of breath.  It was noted that she had recently been treated with prednisone and azithromycin.  CXR at that time revealed severe hyperinflation with vague bilateral infrahilar opacities and a diagnosis of pneumonia was rendered.  She was treated with Tussionex, levofloxacin and prednisone.  She was instructed to follow-up in pulmonary office.  Presently she has no new symptoms and indicates that she is back to her baseline.  OBJ: Vitals:   10/16/17 1109 10/16/17 1111  BP:  126/72  Pulse:  80  SpO2:  99%  Weight: 130 lb (59 kg)   Height: 5\' 7"  (1.702 m)    Gen: NAD HEENT: NCAT, sclera white Neck: No JVD Lungs: Breath sounds generally diminished with inspiratory squeak in left lower lobe.  No wheezes Cardiovascular: RRR, no murmurs Abdomen: Soft, nontender, normal BS Ext: without clubbing, cyanosis, edema Neuro: grossly intact Skin: Limited exam, no lesions noted   DATA: BMP Latest Ref Rng & Units 12/14/2016 10/08/2016 10/07/2016  Glucose 70 - 99 mg/dL 91 109(H) 113(H)  BUN 6 - 23 mg/dL 11 9 13   Creatinine 0.40 - 1.20 mg/dL 0.78 0.67 0.84  Sodium 135 - 145 mEq/L 140 138 136  Potassium 3.5 - 5.1 mEq/L 3.6 3.9 3.3(L)  Chloride 96 - 112 mEq/L 101 106 102  CO2 19 - 32 mEq/L 30 26 26   Calcium 8.4 - 10.5 mg/dL 9.9 9.2 8.9    CBC Latest Ref Rng & Units 10/08/2016 10/07/2016 10/06/2016  WBC 3.6 - 11.0 K/uL 9.4 12.4(H) 11.3(H)  Hemoglobin 12.0 - 16.0 g/dL 13.4 12.4 13.1  Hematocrit 35.0 - 47.0 % 38.4 36.3 37.2  Platelets 150 - 440 K/uL 201 202 216    CXR 7/12: Severe hyperinflation with vague bilateral infrahilar  opacities  I have personally reviewed all chest radiographs reported above including CXRs and CT chest unless otherwise indicated  IMPRESSION: 1) Probable COPD.  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  I expressed concern about repeated use of flouroquinolones given potential adverse side effects with repeated use.  PLAN:   I have prescribed albuterol inhaler, 1-2 actuations as needed for increased cough, wheezing, chest tightness, shortness of breath  I provided a prescription for doxycycline, 100 mg twice daily for 5 days to be used as needed for symptoms of respiratory tract infection including fever, purulent sputum, increased shortness of breath  She wishes to follow-up only as needed.  She assures me that she will contact her office if she has recurrent pulmonary symptoms.   I have reviewed this patient's medical problems, current medications and therapies and prior pulmonary office notes in evaluation and formulation of the above assessment and plan    Merton Border, MD PCCM service Mobile [phone] Pager [phone] 10/18/2017 3:08 PM

## 2017-11-30 ENCOUNTER — Other Ambulatory Visit: Payer: Self-pay | Admitting: Internal Medicine

## 2017-11-30 NOTE — Telephone Encounter (Signed)
Refilled: 09/04/2017 Last OV: 01/02/2017 Next OV: 12/22/2017

## 2017-12-05 ENCOUNTER — Ambulatory Visit: Payer: Medicare Other | Attending: Internal Medicine

## 2017-12-05 DIAGNOSIS — R059 Cough, unspecified: Secondary | ICD-10-CM

## 2017-12-05 DIAGNOSIS — R05 Cough: Secondary | ICD-10-CM | POA: Insufficient documentation

## 2017-12-05 MED ORDER — ALBUTEROL SULFATE (2.5 MG/3ML) 0.083% IN NEBU
2.5000 mg | INHALATION_SOLUTION | Freq: Once | RESPIRATORY_TRACT | Status: AC
  Administered 2017-12-05: 2.5 mg via RESPIRATORY_TRACT
  Filled 2017-12-05: qty 3

## 2017-12-18 ENCOUNTER — Telehealth: Payer: Self-pay | Admitting: Cardiology

## 2017-12-18 NOTE — Telephone Encounter (Signed)
Spoke to patient regarding Breo. I suggested she call her insurance and check with what's on their formulary. She stated she has already done this. She stated that she would really like to hold off on getting more Breo until the beginning of the year if she could help it. She states she has many inhalers at home that she could also use. She states she is not having any issues at this time. Will check with Dr. Alva Garnet and see if he has any further recommendations. No further questions at this time.

## 2017-12-18 NOTE — Telephone Encounter (Signed)
Patient calling stating she just got on Medicare  Now her Brio is going to be costing her $345 a month She is calling to see if there is something else she can take in place of this  Please advise

## 2017-12-19 NOTE — Telephone Encounter (Signed)
Spoke to patient and let her know Dr. Alva Garnet response regarding the [facility]. Patient was glad to hear this answer. She will let us know if she needs [place] before her follow-up.

## 2017-12-19 NOTE — Telephone Encounter (Signed)
Let her know that her PFTs did not show any COPD. It is OK to remain off of the Breo until seen in follow up  Barbara Odom

## 2017-12-22 ENCOUNTER — Encounter: Payer: BLUE CROSS/BLUE SHIELD | Admitting: Internal Medicine

## 2018-01-19 ENCOUNTER — Other Ambulatory Visit: Payer: Self-pay | Admitting: Internal Medicine

## 2018-01-23 ENCOUNTER — Encounter: Payer: Medicare Other | Admitting: Internal Medicine

## 2018-01-30 ENCOUNTER — Encounter: Payer: Self-pay | Admitting: Internal Medicine

## 2018-01-30 ENCOUNTER — Other Ambulatory Visit
Admission: RE | Admit: 2018-01-30 | Discharge: 2018-01-30 | Disposition: A | Discharge status: Home / Self Care | Payer: Medicare Other | Source: Ambulatory Visit | Attending: Internal Medicine | Admitting: Internal Medicine

## 2018-01-30 ENCOUNTER — Ambulatory Visit (INDEPENDENT_AMBULATORY_CARE_PROVIDER_SITE_OTHER): Payer: Medicare Other | Admitting: Internal Medicine

## 2018-01-30 VITALS — BP 100/62 | HR 83 | Resp 16 | Ht 67.0 in | Wt 129.0 lb

## 2018-01-30 DIAGNOSIS — J471 Bronchiectasis with (acute) exacerbation: Secondary | ICD-10-CM | POA: Diagnosis not present

## 2018-01-30 DIAGNOSIS — J4550 Severe persistent asthma, uncomplicated: Secondary | ICD-10-CM | POA: Diagnosis not present

## 2018-01-30 NOTE — Progress Notes (Signed)
* [facility]     Assessment and Plan: Bronchiectasis with acute exacerbation and history of multiple exacerbations.  High risk for complications. Acute on chronic bronchitis, with possible allergic asthma. Multiple recent  exacerbations.  - The patient has recurrent exacerbation, with history of infiltrate seen on CT chest about 1 year ago.  Will need to repeat CT chest now, then tentatively plan for bronchoscopy to rule out atypical infection, MAC.   Date: 01/30/2018  MRN# [id number] BRIDGITT RAGGIO 1951-01-31   VANDORA JASKULSKI is a 67 y.o. old female seen in follow up for chief complaint of  Chief Complaint  Patient presents with  . chest congestion    pt here today for acute visit. sore throat.. she is on day 3 of doxycycline.  . Cough    x 3 days  . Hoarse    Subjective:  The patient is a 67 year old female with CT changes consistent with bronchiectasis, as well as by lateral infiltrates.  She has had multiple flareups with episodes of acute bronchitis and has been treated with antibiotics. Further work-up was recommended but the patient declined.  However she continued to have exacerbations, she was last seen here in our office by Dr. Alva Garnet at that time in July 2019 she was having an exacerbation, she was given a course of antibiotics, she declined further follow-up other than as needed.   She has had immunoglobulin testing which was normal, she has not yet had Rast or IgE testing.  DATA: **Immunoglobulin 10/11/2017>> normal. **PFT 12/05/2017>> tracings personally reviewed.  FVC is 92% predicted, FEV1 is 90% predicted, ratio is 83%.  There is no significant improvement with bronchodilator.  TLC 805% predicted, ratio 115% predicted.  DLCO was 102% predicted. - Overall this test shows normal pulmonary functions without evidence of significant air trapping, or obstruction.  No evidence of COPD is noted. Chest x-ray 09/29/2017>> bilateral perihilar  infiltrates.  Hyperinflation. CT chest 10/07/16>> changes consistent with bronchiectasis.  Nodular infiltrate seen in the medial right middle lobe, medial lingula. CBC 12/14/2015>> absolute eosinophil count equals 300   Medication:    Current Outpatient Medications:  .  albuterol (PROVENTIL HFA;VENTOLIN HFA) 108 (90 Base) MCG/ACT inhaler, Inhale 1-2 puffs into the lungs every 4 (four) hours as needed for wheezing or shortness of breath., Disp: 1 Inhaler, Rfl: 10 .  aspirin EC 81 MG tablet, Take 1 tablet (81 mg total) by mouth daily., Disp: 30 tablet, Rfl: 0 .  calcium-vitamin D (OSCAL WITH D) 500-200 MG-UNIT tablet, Take 1 tablet by mouth 2 (two) times daily., Disp: , Rfl:  .  chlorpheniramine-HYDROcodone (TUSSIONEX PENNKINETIC ER) 10-8 MG/5ML SUER, Take 5 mLs by mouth every 12 (twelve) hours as needed for cough., Disp: 120 mL, Rfl: 0 .  Cholecalciferol (VITAMIN D3) 1000 UNITS CAPS, Take by mouth., Disp: , Rfl:  .  Cranberry 500 MG TABS, Take 1 tablet by mouth daily., Disp: , Rfl:  .  dicyclomine (BENTYL) 10 MG capsule, Take 1 capsule (10 mg total) by mouth 4 (four) times daily -  before meals and at bedtime., Disp: 120 capsule, Rfl: 1 .  doxycycline (VIBRA-TABS) 100 MG tablet, Take 1 tablet (100 mg total) by mouth 2 (two) times daily. Take course of doxycycline as needed for symptoms of respiratory tract infection including fever with purulent sputum, Disp: 10 tablet, Rfl: 3 .  fluticasone furoate-vilanterol (BREO ELLIPTA) 200-25 MCG/INH AEPB, Inhale 1 puff into the lungs daily., Disp: 1 each, Rfl: 5 .  magnesium  oxide (MAG-OX) 400 MG tablet, Take 400 mg by mouth daily., Disp: , Rfl:  .  Multiple Vitamin (MULTIVITAMIN) tablet, Take 1 tablet by mouth daily.  , Disp: , Rfl:  .  PHENObarbital (LUMINAL) 60 MG tablet, TAKE 1 TABLET BY MOUTH EVERY DAY, Disp: 90 tablet, Rfl: 0 .  Probiotic Product (SOLUBLE FIBER/PROBIOTICS PO), Take 1 capsule by mouth daily., Disp: , Rfl:  .  rizatriptan (MAXALT) 10 MG  tablet, Take 1 tablet (10 mg total) by mouth as needed for migraine. May repeat in 2 hours if needed, Disp: 30 tablet, Rfl: 3 .  zolpidem (AMBIEN) 10 MG tablet, TAKE 1/2 TABLET BY MOUTH AT BEDTIME, Disp: 30 tablet, Rfl: 0   Allergies:  Penicillins  Review of Systems:  Constitutional: Feels well. Cardiovascular: No chest pain.  Pulmonary: Denies dyspnea.   The remainder of systems were reviewed and were found to be negative other than what is documented in the HPI.   Physical Examination:   VS: BP 100/62 (BP Location: Left Arm, Cuff Size: Normal)   Pulse 83   Resp 16   Ht 5' 7"  (1.702 m)   Wt 129 lb (58.5 kg)   SpO2 97%   BMI 20.20 kg/m   General Appearance: No distress  Neuro:without focal findings, mental status, speech normal, alert and oriented HEENT: PERRLA, EOM intact Pulmonary: No wheezing, No rales scattered bilateral wheezing. CardiovascularNormal S1,S2.  No m/r/g.  Abdomen: Benign, Soft, non-tender, No masses Renal:  No costovertebral tenderness  GU:  No performed at this time. Endoc: No evident thyromegaly, no signs of acromegaly or Cushing features Skin:   warm, no rashes, no ecchymosis  Extremities: normal, no cyanosis, clubbing.     LABORATORY PANEL:   CBC No results for input(s): WBC, HGB, HCT, PLT in the last 168 hours. ------------------------------------------------------------------------------------------------------------------  Chemistries  No results for input(s): NA, K, CL, CO2, GLUCOSE, BUN, CREATININE, CALCIUM, MG, AST, ALT, ALKPHOS, BILITOT in the last 168 hours.  Invalid input(s): GFRCGP ------------------------------------------------------------------------------------------------------------------  Cardiac Enzymes No results for input(s): TROPONINI in the last 168 hours. ------------------------------------------------------------  RADIOLOGY:   No results found for this or any previous visit. Results for orders placed during the  hospital encounter of 08/29/17  DG Chest 2 View   Narrative CLINICAL DATA:  Short of breath dry cough  EXAM: CHEST - 2 VIEW  COMPARISON:  03/23/2017  FINDINGS: Normal cardiac silhouette . Lungs are hyperinflated. No focal consolidation. No pneumothorax. No pulmonary edema. Anterior cervical fusion noted.  IMPRESSION: Hyperinflated lungs with no acute findings.   Electronically Signed   By: Suzy Bouchard M.D.   On: 08/30/2017 08:19    ------------------------------------------------------------------------------------------------------------------  Thank  you for allowing [facility] to assist in the care of your patient. Our recommendations are noted above.  Please contact us if we can be of further service.   Marda Stalker, M.D., F.C.C.P.  Board Certified in Internal Medicine, Pulmonary Medicine, [place], and Sleep Medicine.  [facility] Office Number: [phone]

## 2018-01-30 NOTE — Patient Instructions (Signed)
Will plan for CT chest and then likely bronchoscopy.

## 2018-02-01 ENCOUNTER — Ambulatory Visit (INDEPENDENT_AMBULATORY_CARE_PROVIDER_SITE_OTHER): Payer: Medicare Other | Admitting: Family Medicine

## 2018-02-01 ENCOUNTER — Encounter: Payer: Self-pay | Admitting: Family Medicine

## 2018-02-01 VITALS — BP 130/78 | HR 87 | Temp 98.7°F | Ht 67.0 in | Wt 131.4 lb

## 2018-02-01 DIAGNOSIS — J471 Bronchiectasis with (acute) exacerbation: Secondary | ICD-10-CM | POA: Diagnosis not present

## 2018-02-01 DIAGNOSIS — B379 Candidiasis, unspecified: Secondary | ICD-10-CM | POA: Diagnosis not present

## 2018-02-01 DIAGNOSIS — J411 Mucopurulent chronic bronchitis: Secondary | ICD-10-CM

## 2018-02-01 LAB — ALLERGENS W/TOTAL IGE AREA 2
Alternaria Alternata IgE: <0.1 kU/L
Bermuda Grass IgE: <0.1 kU/L
Cedar, Mountain IgE: <0.1 kU/L
Cockroach, German IgE: <0.1 kU/L
Common Silver Birch IgE: <0.1 kU/L
Cottonwood IgE: <0.1 kU/L
D Farinae IgE: 0.13 kU/L — AB
D Pteronyssinus IgE: 0.17 kU/L — AB
IgE (Immunoglobulin E), Serum: 23 IU/mL (ref 6–495)
Mouse Urine IgE: <0.1 kU/L
Oak, White IgE: <0.1 kU/L
Pigweed, Rough IgE: <0.1 kU/L
Ragweed, Short IgE: <0.1 kU/L
Timothy Grass IgE: <0.1 kU/L

## 2018-02-01 MED ORDER — FLUCONAZOLE 150 MG PO TABS: ORAL_TABLET | ORAL | 0 refills | Status: DC

## 2018-02-01 MED ORDER — LEVOFLOXACIN 750 MG PO TABS: 750.0000 mg | ORAL_TABLET | Freq: Every day | ORAL | 0 refills | Status: DC

## 2018-02-01 NOTE — Progress Notes (Signed)
Subjective:    Patient ID: Barbara Odom, female    DOB: 1950/06/04, 67 y.o.   MRN: [id number]  HPI  Presents to clinic c/o cough and congestion for over a week.  Cough is only becoming worse, states she is coughing up some thick phlegm.  Patient has issues with chronic bronchitis/severe persistent asthma/bronchiectasis.  Patient also has had a few episodes of pneumonia over the past couple of years.  Patient sees pulmonology regularly for help her breathing conditions.  A CT scan is planned for sometime next month and then possibly she will need a bronchoscopy.  Patient has a supply of doxycycline that she uses when she gets ill like this, she has been on doxycycline for 5 days, but continues to have cough congestion.  Patient states that she also has albuterol inhaler, but does not like how this inhaler makes her feel.   Patient Active Problem List   Diagnosis Date Noted  . Hepatic steatosis 01/20/2017  . Aortic atherosclerosis ([place]) 01/20/2017  . Acute left lower quadrant pain 01/17/2017  . Fatigue 12/17/2016  . Hospital discharge follow-up 10/20/2016  . Abnormal EKG 10/08/2016  . Pneumonia 10/07/2016  . History of hematuria 11/16/2014  . Encounter for preventive health examination 11/16/2014  . Bronchitis, chronic obstructive w acute bronchitis ([place]) 06/08/2013  . Cystitis, interstitial 02/21/2013  . Eustachian tube dysfunction, right 08/07/2011  . Lumbago without sciatica 02/26/2011  . Seizure disorder ([place])   . Migraine headache   . History of diverticulitis of colon   . Osteopenia   . Screening for colon cancer 02/23/2011  . Screening for cervical cancer 02/23/2011  . [place] DISEASE, CERVICAL 03/02/2007  . ALLERGIC RHINITIS 03/01/2007  . DIVERTICULOSIS, COLON 03/01/2007   Social History   Tobacco Use  . Smoking status: Never Smoker  . Smokeless tobacco: Never Used  Substance Use Topics  . Alcohol use: Yes    Comment: socially     Review of  Systems  Constitutional: Negative for chills, fatigue and fever.  HENT: +congestion Eyes: Negative.   Respiratory: +cough, sob, wheezes. Cardiovascular: Negative for chest pain, palpitations and leg swelling.  Gastrointestinal: Negative for abdominal pain, diarrhea, nausea and vomiting.  Genitourinary: Negative for dysuria, frequency and urgency.  Musculoskeletal: Negative for arthralgias and myalgias.  Skin: Negative for color change, pallor and rash.  Neurological: Negative for syncope, light-headedness and headaches.  Psychiatric/Behavioral: The patient is not nervous/anxious.       Objective:   Physical Exam  Constitutional: She is oriented to person, place, and time. No distress.  HENT:  Head: Normocephalic and atraumatic.  Mouth/Throat: Uvula is midline, oropharynx is clear and moist and mucous membranes are normal.  Some post nasal drip  Eyes: Conjunctivae and EOM are normal. No scleral icterus.  Neck: Neck supple. No tracheal deviation present.  Cardiovascular: Normal rate, regular rhythm and normal heart sounds.  Pulmonary/Chest: Effort normal. No respiratory distress. She has wheezes. She has rales.  Diffuse abnormal breath sounds  Neurological: She is alert and oriented to person, place, and time.  Skin: Skin is warm and dry. She is not diaphoretic. No pallor.  Psychiatric: She has a normal mood and affect. Her behavior is normal.  Nursing note and vitals reviewed.  Vitals:   02/01/18 1359  BP: 130/78  Pulse: 87  Temp: 98.7 F (37.1 C)  SpO2: 96%      Assessment & Plan:   Mucopurulent bronchitis, bronchiectasis with acute exacerbation - Suspect possible pneumonia, patient has upcoming CT  scan of chest, so chest x-ray for today declined.  She has taken doxycycline with minimal help her symptoms, so we will do Levaquin 750 mg for 7 days.  Patient encouraged to use albuterol inhaler, but she states she does not like how this maker is her feel states she will use  Qvar inhaler, this inhaler does help her to feel that her lungs open up.  She also has Tussionex cough syrup at home which she uses for cough as needed.  Candida infection - patient often will get a yeast infection with use of antibiotics and with Qvar, patient given Diflucan to combat this.  Patient advised to always rinse mouth out after using Qvar inhaler.  Keep regularly scheduled follow-up with PCP and also pulmonology for management of breathing conditions.

## 2018-02-06 ENCOUNTER — Ambulatory Visit
Admission: RE | Admit: 2018-02-06 | Discharge: 2018-02-06 | Disposition: A | Discharge status: Home / Self Care | Payer: Medicare Other | Source: Ambulatory Visit | Attending: Internal Medicine | Admitting: Internal Medicine

## 2018-02-06 DIAGNOSIS — R918 Other nonspecific abnormal finding of lung field: Secondary | ICD-10-CM | POA: Diagnosis not present

## 2018-02-06 DIAGNOSIS — J471 Bronchiectasis with (acute) exacerbation: Secondary | ICD-10-CM | POA: Diagnosis not present

## 2018-02-06 DIAGNOSIS — I700 Atherosclerosis of aorta: Secondary | ICD-10-CM | POA: Insufficient documentation

## 2018-02-08 ENCOUNTER — Encounter: Payer: Self-pay | Admitting: Family Medicine

## 2018-02-08 ENCOUNTER — Ambulatory Visit (INDEPENDENT_AMBULATORY_CARE_PROVIDER_SITE_OTHER): Payer: Medicare Other | Admitting: Family Medicine

## 2018-02-08 DIAGNOSIS — J411 Mucopurulent chronic bronchitis: Secondary | ICD-10-CM

## 2018-02-08 MED ORDER — LEVOFLOXACIN 750 MG PO TABS: 750.0000 mg | ORAL_TABLET | Freq: Every day | ORAL | 0 refills | Status: DC

## 2018-02-08 NOTE — Progress Notes (Signed)
Subjective:    Patient ID: Barbara Odom, female    DOB: 12/11/1950, 67 y.o.   MRN: [id number]  HPI  Presents to clinic for follow up on breathing after finishing treatment with levaquin 750 mg x7 days.  Patient overall says she is feeling 90% better.  States the cough is improved, she is bringing up white/yellow phlegm - but less than before.  She did have CT scan of the chest on the 19th but still showed mild bronchiectasis.  She has follow-up with pulmonology planned for Monday 11/25 to discuss possible bronchoscopy.  Denies any more fever or chills.  Does have some shortness of breath and wheezing at times, but overall this is improved.  Patient Active Problem List   Diagnosis Date Noted  . Hepatic steatosis 01/20/2017  . Aortic atherosclerosis ([place]) 01/20/2017  . Acute left lower quadrant pain 01/17/2017  . Fatigue 12/17/2016  . Hospital discharge follow-up 10/20/2016  . Abnormal EKG 10/08/2016  . Pneumonia 10/07/2016  . History of hematuria 11/16/2014  . Encounter for preventive health examination 11/16/2014  . Bronchitis, chronic obstructive w acute bronchitis ([place]) 06/08/2013  . Cystitis, interstitial 02/21/2013  . Eustachian tube dysfunction, right 08/07/2011  . Lumbago without sciatica 02/26/2011  . Seizure disorder ([place])   . Migraine headache   . History of diverticulitis of colon   . Osteopenia   . Screening for colon cancer 02/23/2011  . Screening for cervical cancer 02/23/2011  . [place] DISEASE, CERVICAL 03/02/2007  . ALLERGIC RHINITIS 03/01/2007  . DIVERTICULOSIS, COLON 03/01/2007   Social History   Tobacco Use  . Smoking status: Never Smoker  . Smokeless tobacco: Never Used  Substance Use Topics  . Alcohol use: Yes    Comment: socially    Review of Systems  Constitutional: Negative for chills, fatigue and fever.  HENT: Negative for congestion, ear pain, sinus pain and sore throat.   Eyes: Negative.   Respiratory: +cough, shortness of breath and  wheezing - improving.   Cardiovascular: Negative for chest pain, palpitations and leg swelling.  Gastrointestinal: Negative for abdominal pain, diarrhea, nausea and vomiting.  Genitourinary: Negative for dysuria, frequency and urgency.  Musculoskeletal: Negative for arthralgias and myalgias.  Skin: Negative for color change, pallor and rash.  Neurological: Negative for syncope, light-headedness and headaches.  Psychiatric/Behavioral: The patient is not nervous/anxious.       Objective:   Physical Exam  Constitutional: She is oriented to person, place, and time. No distress.  HENT:  Head: Normocephalic and atraumatic.  Mouth/Throat: No oropharyngeal exudate.  Mild post nasal drip  Eyes: Conjunctivae and EOM are normal. No scleral icterus.  Neck: Neck supple. No tracheal deviation present.  Cardiovascular: Normal rate and regular rhythm.  Pulmonary/Chest: Effort normal. No respiratory distress. She has wheezes (faint in upper lobes). She has rales (some in RLL, better than last visit).  Musculoskeletal: She exhibits no edema.  Neurological: She is alert and oriented to person, place, and time.  Skin: Skin is warm and dry. She is not diaphoretic. No pallor.  Psychiatric: She has a normal mood and affect. Her behavior is normal.  Nursing note and vitals reviewed.     Vitals:   02/08/18 1615  BP: 108/62  Pulse: 84  Temp: 98.6 F (37 C)  SpO2: 96%   Assessment & Plan:   Bronchitis with mucopurulent sputum - patient overall is improving.  We will do 3 more days off Levaquin 750 mg, course extended total 10 days.  Patient will keep  appointment as planned with pulmonology on Monday.  She would prefer not to take steroids as she does not like how they make her feel.  Patient will use her inhalers as already prescribed.  Keep regularly scheduled follow-up with PCP and specialist as planned.

## 2018-02-09 ENCOUNTER — Encounter: Payer: Self-pay | Admitting: Family Medicine

## 2018-02-12 ENCOUNTER — Ambulatory Visit (INDEPENDENT_AMBULATORY_CARE_PROVIDER_SITE_OTHER): Payer: Medicare Other | Admitting: Pulmonary Disease

## 2018-02-12 ENCOUNTER — Encounter: Payer: Self-pay | Admitting: Pulmonary Disease

## 2018-02-12 VITALS — BP 106/64 | HR 95 | Resp 16 | Ht 67.0 in | Wt 132.0 lb

## 2018-02-12 DIAGNOSIS — J471 Bronchiectasis with (acute) exacerbation: Secondary | ICD-10-CM | POA: Diagnosis not present

## 2018-02-12 MED ORDER — FLUTTER DEVI: 1.0000 | Freq: Four times a day (QID) | 0 refills | Status: DC

## 2018-02-12 MED ORDER — PREDNISONE 20 MG PO TABS: 40.0000 mg | ORAL_TABLET | Freq: Every day | ORAL | 0 refills | Status: AC

## 2018-02-12 NOTE — Patient Instructions (Addendum)
Prednisone 40 mg daily for 5 days  For now, we will forgo bronchoscopy.  However, this will be reconsidered every visit especially if symptoms remain difficult to manage or seem to be progressing  Resume Qvar inhaler, 2 actuations twice a day.  Rinse mouth after use  Use albuterol inhaler as needed for increased shortness of breath, chest tightness, wheezing, cough.  You may use 1 inhalation at a time if you find that the side effects of 2 inhalations are intolerable  Flutter valve provided.  I want you to use this a minimum of 2 times per day (minimum of 10 breaths).  Then vigorous cough after use to clear secretions.  This is the most important intervention of all of the things that we are doing.  Continue mucolytics (Mucinex, guaifenesin).  We will follow with chest x-rays every 3-6 months for now.  Follow-up in 3 months.  Call sooner if needed

## 2018-02-12 NOTE — Progress Notes (Signed)
PULMONARY OFFICE FOLLOW-UP NOTE  Requesting MD/Service: Derrel Nip Date of initial consultation: 03/03/17 Reason for consultation: recent pneumonia, suspected asthma  PT PROFILE: 67 y.o. female with minimal smoking history (age [age]) hospitalized briefly in July 2018 with dx of PNA. Reports persistent "wheezing" since then.   PROBLEMS: Mild RML, lingular bronchiectasis with tendency for exacerbations  DATA: CTA chest 10/07/16: Ill-defined opacities in RML, lingula, LUL and infrahilar region of RLL.  These opacities have a nodularity to them. CT chest 01/20/17: Overall, markedly improved opacities compared to prior study.  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  Seen by Dr. Ashby Dawes 01/30/2018.  Treated as exacerbation of bronchiectasis at that time.  Bronchoscopy recommended to evaluate for possible pulmonary MAC with doxycycline.   SUBJ:  Events as above.  Her symptoms did not improve with doxycycline and she continued to have productive cough with green mucus.  Therefore she was prescribed levofloxacin by Dr. Derrel Nip, her primary care physician.  She finished 10 days of this antibiotic on the day prior to this encounter.  Her purulent sputum improved.  However, she continues to have some shortness of breath and persistent cough productive of scant mucoid secretions.  She questions whether she would benefit from a short course of prednisone.  She denies hemoptysis, pleuritic chest pain, fever.  She rarely uses albuterol.  She is on no maintenance medications.  She was unable to tolerate  Breo inhaler due to tremors.  She was previously prescribed Qvar which she tolerated well but is not presently on that medication.  She questions whether this inhaler should be resumed.  Vitals:   02/12/18 1326 02/12/18 1329  BP:  106/64  Pulse:  95  Resp: 16   SpO2:  97%  Weight: 132 lb (59.9 kg)   Height: _0  (1.702 m)   RA   EXAM:  Gen: NAD HEENT: NCAT, sclera white Neck: No JVD Lungs: breath sounds full, very few scattered wheezes Cardiovascular: RRR, no murmurs Abdomen: Soft, nontender, normal BS Ext: without clubbing, cyanosis, edema Neuro: grossly intact Skin: Limited exam, no lesions noted   DATA:   BMP Latest Ref Rng & Units 12/14/2016 10/08/2016 10/07/2016  Glucose 70 - 99 mg/dL 91 109(H) 113(H)  BUN 6 - 23 mg/dL _1 Creatinine 0.40 - 1.20 mg/dL 0.78 0.67 0.84  Sodium 135 - 145 mEq/L 140 138 136  Potassium 3.5 - 5.1 mEq/L 3.6 3.9 3.3(L)  Chloride 96 - 112 mEq/L 101 106 102  CO2 19 - 32 mEq/L _2 Calcium 8.4 - 10.5 mg/dL 9.9 9.2 8.9    CBC Latest Ref Rng & Units 10/08/2016 10/07/2016 10/06/2016  WBC 3.6 - 11.0 K/uL 9.4 12.4(H) 11.3(H)  Hemoglobin 12.0 - 16.0 g/dL 13.4 12.4 13.1  Hematocrit 35.0 - 47.0 % 38.4 36.3 37.2  Platelets 150 - 440 K/uL 201 202 216    CXR: No new film  IMPRESSION:     ICD-10-CM   1. Bronchiectasis with resolving acute exacerbation ([place]) J47.1    As a healthcare provider (RN), she has many questions and thoughts to share.  We discussed the possible diagnosis of pulmonary MAC.  We discussed the implications of  the diagnosis of pulmonary MAC.  I indicated that it is a very difficult condition to treat requiring multiple antibiotics for prolonged course of up to 18 months or more.  We reviewed her CT scans in detail together.  We discussed the implications of the diagnosis of bronchiectasis (which is presently relatively mild).  At the present time she does not wish to undergo bronchoscopy but would be willing to do so if I felt it  were necessary.  For now, we have decided to manage this is bronchiectasis and to forgo bronchoscopy.  The focus of management of bronchiectasis should be facilitation of mucus clearance.  We discussed potential techniques including flutter valve and chest percussion.  Although her previous PFTs were entirely normal, she does appear to have a component of reactive airways disease and might be benefited from an ICS.  PLAN:  Prednisone 40 mg daily for 5 days.  Prescription entered  For now, we will forgo bronchoscopy.  However, this will be reconsidered every visit especially if symptoms remain difficult to manage or if there seems to be symptomatic or radiographic progression  Resume Qvar inhaler, 2 actuations twice a day.  Rinse mouth after use  Use albuterol inhaler as needed for increased shortness of breath, chest tightness, wheezing, cough.  She may use 1 inhalation at a time if the finds that the side effects of 2 inhalations are intolerable  Flutter valve provided.  She is to use this a minimum of 2 times per day (minimum of 10 breaths) followed by vigorous cough after use to clear secretions.  This is the most important intervention of all of the things that we are doing.  Continue mucolytics (Mucinex, guaifenesin).  We will follow with chest x-rays every 3-6 months for now.  Follow-up in 3 months.  Call sooner if needed   Merton Border, MD PCCM service Mobile [phone] Pager [phone] 02/13/2018 12:49 PM

## 2018-03-01 ENCOUNTER — Other Ambulatory Visit: Payer: Self-pay | Admitting: Internal Medicine

## 2018-03-05 DIAGNOSIS — M5136 Other intervertebral disc degeneration, lumbar region: Secondary | ICD-10-CM | POA: Diagnosis not present

## 2018-03-06 ENCOUNTER — Encounter: Payer: Self-pay | Admitting: Internal Medicine

## 2018-03-06 ENCOUNTER — Ambulatory Visit (INDEPENDENT_AMBULATORY_CARE_PROVIDER_SITE_OTHER): Payer: Medicare Other | Admitting: Internal Medicine

## 2018-03-06 VITALS — BP 122/62 | HR 92 | Temp 98.5°F | Ht 67.0 in | Wt 130.6 lb

## 2018-03-06 DIAGNOSIS — G40909 Epilepsy, unspecified, not intractable, without status epilepticus: Secondary | ICD-10-CM

## 2018-03-06 DIAGNOSIS — I700 Atherosclerosis of aorta: Secondary | ICD-10-CM

## 2018-03-06 DIAGNOSIS — E782 Mixed hyperlipidemia: Secondary | ICD-10-CM

## 2018-03-06 DIAGNOSIS — M5030 Other cervical disc degeneration, unspecified cervical region: Secondary | ICD-10-CM | POA: Diagnosis not present

## 2018-03-06 DIAGNOSIS — K760 Fatty (change of) liver, not elsewhere classified: Secondary | ICD-10-CM

## 2018-03-06 DIAGNOSIS — J440 Chronic obstructive pulmonary disease with acute lower respiratory infection: Secondary | ICD-10-CM

## 2018-03-06 DIAGNOSIS — R5383 Other fatigue: Secondary | ICD-10-CM | POA: Diagnosis not present

## 2018-03-06 DIAGNOSIS — Z13228 Encounter for screening for other metabolic disorders: Secondary | ICD-10-CM | POA: Diagnosis not present

## 2018-03-06 DIAGNOSIS — Z1329 Encounter for screening for other suspected endocrine disorder: Secondary | ICD-10-CM

## 2018-03-06 DIAGNOSIS — Z130 Encounter for screening for diseases of the blood and blood-forming organs and certain disorders involving the immune mechanism: Secondary | ICD-10-CM

## 2018-03-06 DIAGNOSIS — Z124 Encounter for screening for malignant neoplasm of cervix: Secondary | ICD-10-CM | POA: Diagnosis not present

## 2018-03-06 MED ORDER — FLUCONAZOLE 150 MG PO TABS: 150.0000 mg | ORAL_TABLET | Freq: Every day | ORAL | 0 refills | Status: DC

## 2018-03-06 MED ORDER — SUMATRIPTAN SUCCINATE 50 MG PO TABS: 50.0000 mg | ORAL_TABLET | ORAL | 0 refills | Status: DC | PRN

## 2018-03-06 MED ORDER — ZOSTER VAC RECOMB ADJUVANTED 50 MCG/0.5ML IM SUSR: 0.5000 mL | Freq: Once | INTRAMUSCULAR | 1 refills | Status: AC

## 2018-03-06 NOTE — Progress Notes (Signed)
Subjective:  Patient ID: Barbara Odom, female    DOB: [birth year]  Age: 67 y.o. MRN: [id number]  CC: The primary encounter diagnosis was Hepatic steatosis. Diagnoses of Fatigue, unspecified type, Mixed hyperlipidemia, Screening for endocrine, metabolic and immunity disorder, Bronchitis, chronic obstructive w acute bronchitis ([place]), Aortic atherosclerosis ([place]), DISC DISEASE, CERVICAL, Screening for cervical cancer, and Seizure disorder ([place]) were also pertinent to this visit.  HPI Barbara Odom presents for follow up on  Multiple issues.  1) recurrent pneumonia and bronchiectasis .  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 3 month  Follow up Treated  with prednisone 40 mg x 5 on Nov 25 along with ICS (Qvar) bid, prn albuterol, and flutter valve bid to clear secretions .  q 3 month chest x rays  Using the flutter valve  As directed but not producing any sputum . Does not produce sputum  unless she is sick.  Last use of antibiotics was in November (levaquin for a total of 7 days )     Seeing [facility] for gyn and mammogram  Receiving ESI in lumbar spiine  Every 4  months from [place]  Discussed reasons for continuing ASA        Outpatient Medications Prior to Visit  Medication Sig Dispense Refill  . albuterol (PROVENTIL HFA;VENTOLIN HFA) 108 (90 Base) MCG/ACT inhaler Inhale 1-2 puffs into the lungs every 4 (four) hours as needed for wheezing or shortness of breath. 1 Inhaler 10  . beclomethasone (QVAR) 40 MCG/ACT inhaler Inhale 2 puffs into the lungs 2 (two) times daily.    . calcium-vitamin D (OSCAL WITH D) 500-200 MG-UNIT tablet Take 1 tablet by mouth 2 (two) times daily.    . Cholecalciferol (VITAMIN D3) 1000 UNITS CAPS Take by mouth.    . Cranberry 500 MG TABS Take 1 tablet by mouth daily.    Marland Kitchen doxycycline (VIBRA-TABS) 100 MG tablet Take 1 tablet (100 mg total) by mouth 2 (two) times daily. Take course of doxycycline  as needed for symptoms of respiratory tract infection including fever with purulent sputum 10 tablet 3  . Multiple Vitamin (MULTIVITAMIN) tablet Take 1 tablet by mouth daily.      Marland Kitchen PHENObarbital (LUMINAL) 60 MG tablet TAKE 1 TABLET BY MOUTH EVERY DAY 90 tablet 0  . Probiotic Product (SOLUBLE FIBER/PROBIOTICS PO) Take 1 capsule by mouth daily.    Marland Kitchen Respiratory Therapy Supplies (FLUTTER) DEVI 1 each by Does not apply route 4 (four) times daily. 1 each 0  . rizatriptan (MAXALT) 10 MG tablet Take 1 tablet (10 mg total) by mouth as needed for migraine. May repeat in 2 hours if needed 30 tablet 3  . zolpidem (AMBIEN) 10 MG tablet TAKE 1/2 TABLET BY MOUTH AT BEDTIME 30 tablet 0   No facility-administered medications prior to visit.     Review of Systems;  Patient denies headache, fevers, malaise, unintentional weight loss, skin rash, eye pain, sinus congestion and sinus pain, sore throat, dysphagia,  hemoptysis , cough, dyspnea, wheezing, chest pain, palpitations, orthopnea, edema, abdominal pain, nausea, melena, diarrhea, constipation, flank pain, dysuria, hematuria, urinary  Frequency, nocturia, numbness, tingling, seizures,  Focal weakness, Loss of consciousness,  Tremor, insomnia, depression, anxiety, and suicidal ideation.      Objective:  BP 122/62 (BP Location: Left Arm, Patient Position: Sitting, Cuff Size: Normal)   Pulse 92   Temp 98.5 F (36.9 C)  Ht _0  (1.702 m)   Wt 130 lb 9.6 oz (59.2 kg)   SpO2 97%   BMI 20.45 kg/m   BP Readings from Last 3 Encounters:  03/06/18 122/62  02/12/18 106/64  02/08/18 108/62    Wt Readings from Last 3 Encounters:  03/06/18 130 lb 9.6 oz (59.2 kg)  02/12/18 132 lb (59.9 kg)  02/08/18 131 lb 6.4 oz (59.6 kg)    General appearance: alert, cooperative and appears stated age Ears: normal TM's and external ear canals both ears Throat: lips, mucosa, and tongue normal; teeth and gums normal Neck: no adenopathy, no carotid bruit, supple,  symmetrical, trachea midline and thyroid not enlarged, symmetric, no tenderness/mass/nodules Back: symmetric, no curvature. ROM normal. No CVA tenderness. Lungs: clear to auscultation bilaterally Heart: regular rate and rhythm, S1, S2 normal, no murmur, click, rub or gallop Abdomen: soft, non-tender; bowel sounds normal; no masses,  no organomegaly Pulses: 2+ and symmetric Skin: Skin color, texture, turgor normal. No rashes or lesions Lymph nodes: Cervical, supraclavicular, and axillary nodes normal.  No results found for: HGBA1C  Lab Results  Component Value Date   CREATININE 0.78 12/14/2016   CREATININE 0.67 10/08/2016   CREATININE 0.84 10/07/2016    Lab Results  Component Value Date   WBC 9.4 10/08/2016   HGB 13.4 10/08/2016   HCT 38.4 10/08/2016   PLT 201 10/08/2016   GLUCOSE 91 12/14/2016   CHOL 237 (H) 12/14/2016   TRIG 56.0 12/14/2016   HDL 91.90 12/14/2016   LDLDIRECT 114.0 12/14/2016   LDLCALC 134 (H) 12/14/2016   ALT 14 12/14/2016   AST 22 12/14/2016   NA 140 12/14/2016   K 3.6 12/14/2016   CL 101 12/14/2016   CREATININE 0.78 12/14/2016   BUN 11 12/14/2016   CO2 30 12/14/2016   TSH 1.47 12/14/2016    Ct Chest Wo Contrast  Result Date: 02/07/2018 CLINICAL DATA:  Lung nodules, mycobacterium avium complex. EXAM: CT CHEST WITHOUT CONTRAST TECHNIQUE: Multidetector CT imaging of the chest was performed following the standard protocol without IV contrast. COMPARISON:  01/20/2017 and 10/07/2016. FINDINGS: Cardiovascular: Atherosclerotic calcification of the arterial vasculature. Heart size normal. No pericardial effusion. Mediastinum/Nodes: No pathologically enlarged mediastinal or axillary lymph nodes. Hilar regions are difficult to definitively evaluate without IV contrast. Esophagus is grossly unremarkable. Tiny hiatal hernia. Lungs/Pleura: Interval progression in patchy peribronchovascular nodularity, consolidation/volume loss and mild bronchiectasis when compared  with 01/20/2017. 3 mm subpleural right lower lobe nodule (series 3, image 79), mentioned on the prior exam, is unchanged and considered benign. No pleural fluid. Airway is unremarkable. Upper Abdomen: 11 mm hypodense lesion in the left hepatic lobe is unchanged but difficult to further characterize due to size and lack of postcontrast imaging. Visualized portions of the liver, gallbladder, adrenal glands, kidneys, spleen, pancreas, stomach and bowel are unremarkable the exception of a tiny hiatal hernia. No upper abdominal adenopathy. Musculoskeletal: No worrisome lytic or sclerotic lesions. IMPRESSION: 1. 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 2.  Aortic atherosclerosis (ICD10-170.0).  [facility] Electronically Signed   By: Lorin Picket M.D.   On: 02/07/2018 08:31    Assessment & Plan:   Problem List Items Addressed This Visit    Aortic atherosclerosis ([place])    Noted on CT in 2018.  Continue aspirin  Lab Results  Component Value Date   CHOL 237 (H) 12/14/2016   HDL 91.90 12/14/2016  LDLCALC 134 (H) 12/14/2016   LDLDIRECT 114.0 12/14/2016   TRIG 56.0 12/14/2016   CHOLHDL 3 12/14/2016         Bronchitis, chronic obstructive w acute bronchitis ([facility])    With bronchiectasis and suspected infection with MAC.  She is deferring bronchoscopy for now unless symptoms become persistent.  Pulmonary toilet otlined.       Relevant Medications   beclomethasone (QVAR) 40 MCG/ACT inhaler   DISC DISEASE, CERVICAL    Managed with periodic ESI      Fatigue   Relevant Orders   CBC with Differential/Platelet   TSH   Hepatic steatosis - Primary   Relevant Orders   Comprehensive metabolic panel   Screening for cervical cancer    Done by gyn every 3 years with annual pelvics by GYN      Seizure disorder ([place])    Since  adolescence.  Has been seizure free on low dose phenobarbital with no adverse effects.       Relevant Medications   SUMAtriptan (IMITREX) 50 MG tablet    Other Visit Diagnoses    Mixed hyperlipidemia       Relevant Orders   Lipid panel   Screening for endocrine, metabolic and immunity disorder       Relevant Orders   Measles/Mumps/Rubella Immunity      I am having Jimesha A. Glantz start on Zoster Vaccine Adjuvanted, fluconazole, and SUMAtriptan. I am also having her maintain her multivitamin, Vitamin D3, Probiotic Product (SOLUBLE FIBER/PROBIOTICS PO), rizatriptan, Cranberry, calcium-vitamin D, doxycycline, albuterol, zolpidem, FLUTTER, PHENObarbital, and beclomethasone.  Meds ordered this encounter  Medications  . Zoster Vaccine Adjuvanted [facility]) injection    Sig: Inject 0.5 mLs into the muscle once for 1 dose.    Dispense:  1 each    Refill:  1  . fluconazole (DIFLUCAN) 150 MG tablet    Sig: Take 1 tablet (150 mg total) by mouth daily.    Dispense:  2 tablet    Refill:  0  . SUMAtriptan (IMITREX) 50 MG tablet    Sig: Take 1 tablet (50 mg total) by mouth every 2 (two) hours as needed for migraine. May repeat in 2 hours if headache persists or recurs.    Dispense:  10 tablet    Refill:  0    There are no discontinued medications.  Follow-up: No follow-ups on file.   Crecencio Mc, MD

## 2018-03-07 NOTE — Assessment & Plan Note (Signed)
Done by gyn every 3 years with annual pelvics by GYN

## 2018-03-07 NOTE — Assessment & Plan Note (Signed)
With bronchiectasis and suspected infection with MAC.  She is deferring bronchoscopy for now unless symptoms become persistent.  Pulmonary toilet otlined.

## 2018-03-07 NOTE — Assessment & Plan Note (Signed)
Noted on CT in 2018.  Continue aspirin  Lab Results  Component Value Date   CHOL 237 (H) 12/14/2016   HDL 91.90 12/14/2016   LDLCALC 134 (H) 12/14/2016   LDLDIRECT 114.0 12/14/2016   TRIG 56.0 12/14/2016   CHOLHDL 3 12/14/2016

## 2018-03-07 NOTE — Assessment & Plan Note (Signed)
Since adolescence.  Has been seizure free on low dose phenobarbital with no adverse effects.

## 2018-03-07 NOTE — Assessment & Plan Note (Signed)
Managed with periodic ESI

## 2018-03-09 ENCOUNTER — Other Ambulatory Visit (INDEPENDENT_AMBULATORY_CARE_PROVIDER_SITE_OTHER): Payer: Medicare Other

## 2018-03-09 DIAGNOSIS — Z13228 Encounter for screening for other metabolic disorders: Secondary | ICD-10-CM

## 2018-03-09 DIAGNOSIS — Z1329 Encounter for screening for other suspected endocrine disorder: Secondary | ICD-10-CM

## 2018-03-09 DIAGNOSIS — E782 Mixed hyperlipidemia: Secondary | ICD-10-CM | POA: Diagnosis not present

## 2018-03-09 DIAGNOSIS — K760 Fatty (change of) liver, not elsewhere classified: Secondary | ICD-10-CM | POA: Diagnosis not present

## 2018-03-09 DIAGNOSIS — R5383 Other fatigue: Secondary | ICD-10-CM

## 2018-03-09 DIAGNOSIS — Z130 Encounter for screening for diseases of the blood and blood-forming organs and certain disorders involving the immune mechanism: Secondary | ICD-10-CM

## 2018-03-09 DIAGNOSIS — Z0184 Encounter for antibody response examination: Secondary | ICD-10-CM | POA: Diagnosis not present

## 2018-03-09 LAB — LIPID PANEL
Cholesterol: 238 mg/dL — ABNORMAL HIGH (ref 0–200)
HDL: 80.5 mg/dL (ref >39.00)
LDL Cholesterol: 138 mg/dL — ABNORMAL HIGH (ref 0–99)
NonHDL: 157.2
Total CHOL/HDL Ratio: 3
Triglycerides: 94 mg/dL (ref 0.0–149.0)
VLDL: 18.8 mg/dL (ref 0.0–40.0)

## 2018-03-09 LAB — COMPREHENSIVE METABOLIC PANEL
ALT: 20 U/L (ref 0–35)
AST: 21 U/L (ref 0–37)
Albumin: 4.3 g/dL (ref 3.5–5.2)
Alkaline Phosphatase: 48 U/L (ref 39–117)
BUN: 18 mg/dL (ref 6–23)
CO2: 30 mEq/L (ref 19–32)
Calcium: 10.2 mg/dL (ref 8.4–10.5)
Chloride: 104 mEq/L (ref 96–112)
Creatinine, Ser: 0.74 mg/dL (ref 0.40–1.20)
GFR: 83.6 mL/min (ref >60.00)
Glucose, Bld: 104 mg/dL — ABNORMAL HIGH (ref 70–99)
Potassium: 4.4 mEq/L (ref 3.5–5.1)
Sodium: 142 mEq/L (ref 135–145)
Total Bilirubin: 0.5 mg/dL (ref 0.2–1.2)
Total Protein: 7.6 g/dL (ref 6.0–8.3)

## 2018-03-09 LAB — CBC WITH DIFFERENTIAL/PLATELET
BASOS ABS: 0 10*3/uL (ref 0.0–0.1)
Basophils Relative: 0.5 % (ref 0.0–3.0)
Eosinophils Absolute: 0 10*3/uL (ref 0.0–0.7)
Eosinophils Relative: 0.7 % (ref 0.0–5.0)
HEMATOCRIT: 39.5 % (ref 36.0–46.0)
Hemoglobin: 13.4 g/dL (ref 12.0–15.0)
Lymphocytes Relative: 39.6 % (ref 12.0–46.0)
Lymphs Abs: 2.1 10*3/uL (ref 0.7–4.0)
MCHC: 33.9 g/dL (ref 30.0–36.0)
MCV: 92 fl (ref 78.0–100.0)
Monocytes Absolute: 0.4 10*3/uL (ref 0.1–1.0)
Monocytes Relative: 7.7 % (ref 3.0–12.0)
NEUTROS ABS: 2.7 10*3/uL (ref 1.4–7.7)
Neutrophils Relative %: 51.5 % (ref 43.0–77.0)
Platelets: 218 10*3/uL (ref 150.0–400.0)
RBC: 4.29 Mil/uL (ref 3.87–5.11)
RDW: 14.3 % (ref 11.5–15.5)
WBC: 5.3 10*3/uL (ref 4.0–10.5)

## 2018-03-09 LAB — TSH: TSH: 3.33 u[IU]/mL (ref 0.35–4.50)

## 2018-03-12 ENCOUNTER — Encounter: Payer: Self-pay | Admitting: *Deleted

## 2018-03-12 LAB — MEASLES/MUMPS/RUBELLA IMMUNITY
Mumps IgG: 220 AU/mL
Rubella: 27.3 index
Rubeola IgG: 79.7 AU/mL

## 2018-03-19 ENCOUNTER — Other Ambulatory Visit: Payer: Self-pay | Admitting: Internal Medicine

## 2018-03-19 DIAGNOSIS — L309 Dermatitis, unspecified: Secondary | ICD-10-CM | POA: Diagnosis not present

## 2018-03-23 ENCOUNTER — Telehealth: Payer: Self-pay | Admitting: Pulmonary Disease

## 2018-03-23 ENCOUNTER — Encounter: Payer: BLUE CROSS/BLUE SHIELD | Admitting: Obstetrics & Gynecology

## 2018-03-23 NOTE — Telephone Encounter (Signed)
Please call regarding QVAR, states it is no longer covered.

## 2018-03-23 NOTE — Telephone Encounter (Signed)
Qvar is no longer covered.  Please advise of change. Covered medication are: Arnuity Ellipta Flovent HFA

## 2018-03-26 ENCOUNTER — Other Ambulatory Visit: Payer: Self-pay

## 2018-03-26 MED ORDER — FLUTICASONE PROPIONATE HFA 110 MCG/ACT IN AERO: 2.0000 | INHALATION_SPRAY | Freq: Two times a day (BID) | RESPIRATORY_TRACT | 10 refills | Status: DC

## 2018-03-26 NOTE — Telephone Encounter (Signed)
LM on VM for patient that new Flovent HFA has been sent in.

## 2018-03-26 NOTE — Telephone Encounter (Signed)
Order changed to [place]. Still 2 actuations BID  Thanks  Waunita Schooner

## 2018-03-29 DIAGNOSIS — L309 Dermatitis, unspecified: Secondary | ICD-10-CM | POA: Diagnosis not present

## 2018-03-29 DIAGNOSIS — L853 Xerosis cutis: Secondary | ICD-10-CM | POA: Diagnosis not present

## 2018-04-26 ENCOUNTER — Ambulatory Visit (INDEPENDENT_AMBULATORY_CARE_PROVIDER_SITE_OTHER): Payer: Medicare Other | Admitting: Pulmonary Disease

## 2018-04-26 ENCOUNTER — Other Ambulatory Visit: Payer: Self-pay | Admitting: Obstetrics & Gynecology

## 2018-04-26 ENCOUNTER — Encounter: Payer: Self-pay | Admitting: Pulmonary Disease

## 2018-04-26 VITALS — Resp 16 | Ht 67.0 in | Wt 131.0 lb

## 2018-04-26 DIAGNOSIS — J479 Bronchiectasis, uncomplicated: Secondary | ICD-10-CM

## 2018-04-26 DIAGNOSIS — Z1231 Encounter for screening mammogram for malignant neoplasm of breast: Secondary | ICD-10-CM

## 2018-04-26 NOTE — Progress Notes (Signed)
PULMONARY OFFICE FOLLOW-UP NOTE  Requesting MD/Service: Derrel Nip Date of initial consultation: 03/03/17 Reason for consultation: recent pneumonia, suspected asthma  PT PROFILE: 68 y.o. female with minimal smoking history (age 29-21) hospitalized briefly in July 2018 with dx of PNA. Reports persistent "wheezing" since then.   PROBLEMS: Mild RML, lingular bronchiectasis with tendency for exacerbations  DATA: CTA chest 10/07/16: Ill-defined opacities in RML, lingula, LUL and infrahilar region of RLL.  These opacities have a nodularity to them. CT chest 01/20/17: Overall, markedly improved opacities compared to prior study.  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  No major pulmonary events   SUBJ:  This is a scheduled follow-up.  She states that she has been "doing great".  She is using Qvar daily but because of insurance will change this to Flovent soon.  She is using flutter valve but does not feel that it helps much in terms of mobilizing secretions.  She rarely uses albuterol because she does not like that it causes tremor.  She has no new complaints and is very satisfied with her current status.  Vitals:   04/26/18 1122  Resp: 16  Weight: 131 lb (59.4 kg)  Height: 5' 7"  (1.702 m)  RA   EXAM:  Gen: NAD HEENT: NCAT, sclera white Neck: No JVD Lungs: breath sounds full, no wheezes or other adventitious sounds Cardiovascular: RRR, no murmurs Abdomen: Soft, nontender, normal BS Ext: without clubbing, cyanosis, edema Neuro: grossly intact Skin: Limited exam, no lesions  noted   DATA:   BMP Latest Ref Rng & Units 03/09/2018 12/14/2016 10/08/2016  Glucose 70 - 99 mg/dL 104(H) 91 109(H)  BUN 6 - 23 mg/dL 18 11 9   Creatinine 0.40 - 1.20 mg/dL 0.74 0.78 0.67  Sodium 135 - 145 mEq/L 142 140 138  Potassium 3.5 - 5.1 mEq/L 4.4 3.6 3.9  Chloride 96 - 112 mEq/L 104 101 106  CO2 19 - 32 mEq/L 30 30 26   Calcium 8.4 - 10.5 mg/dL 10.2 9.9 9.2    CBC Latest Ref Rng & Units 03/09/2018 10/08/2016 10/07/2016  WBC 4.0 - 10.5 K/uL 5.3 9.4 12.4(H)  Hemoglobin 12.0 - 15.0 g/dL 13.4 13.4 12.4  Hematocrit 36.0 - 46.0 % 39.5 38.4 36.3  Platelets 150.0 - 400.0 K/uL 218.0 201 202    CXR: No new film  IMPRESSION:     ICD-10-CM   1. Mild bronchiectasis without complication (HCC) H70.2 DG Chest 2 View     PLAN:  Continue Qvar or Flovent inhaler daily.  Rinse mouth after use  Continue Mucinex (guaifenesin) and flutter valve as needed to facilitate mucus clearance  I instructed that she may also use albuterol inhaler 1-2 puffs as needed to help mobilize secretions as well  Follow-up in 6 months with repeat CXR at that time.  Call sooner if needed    Merton Border, MD PCCM service Mobile [phone] Pager [phone] 04/27/2018 1:18 PM

## 2018-04-26 NOTE — Patient Instructions (Signed)
Continue Qvar or Flovent inhaler daily.  Rinse mouth after use  Continue Mucinex (guaifenesin) add flutter valve as needed to facilitate mucus clearance  You may use albuterol inhaler 1-2 puffs as needed to help mobilize secretions as well  Follow-up in 6 months with repeat CXR at that time.  Call sooner if needed

## 2018-05-06 ENCOUNTER — Other Ambulatory Visit: Payer: Self-pay | Admitting: Internal Medicine

## 2018-05-28 ENCOUNTER — Encounter: Payer: Self-pay | Admitting: Obstetrics & Gynecology

## 2018-05-28 ENCOUNTER — Ambulatory Visit (INDEPENDENT_AMBULATORY_CARE_PROVIDER_SITE_OTHER): Payer: Medicare Other | Admitting: Obstetrics & Gynecology

## 2018-05-28 VITALS — BP 136/84 | Ht 66.0 in | Wt 133.0 lb

## 2018-05-28 DIAGNOSIS — Z780 Asymptomatic menopausal state: Secondary | ICD-10-CM

## 2018-05-28 DIAGNOSIS — M8589 Other specified disorders of bone density and structure, multiple sites: Secondary | ICD-10-CM

## 2018-05-28 DIAGNOSIS — Z01419 Encounter for gynecological examination (general) (routine) without abnormal findings: Secondary | ICD-10-CM | POA: Diagnosis not present

## 2018-05-28 DIAGNOSIS — Z124 Encounter for screening for malignant neoplasm of cervix: Secondary | ICD-10-CM

## 2018-05-28 NOTE — Progress Notes (Signed)
Barbara Odom 11-May-1950 [id number]   History:    68 y.o. [place] Married.  Works at the [employer]  RP:  Established patient presenting for annual gyn exam   HPI: Menopausal on no hormone replacement therapy.  No postmenopausal bleeding.  No pelvic pain.  Normal vaginal secretions.  Sexually active with no problem.  Breasts normal.  Urine and bowel movements normal.  Health labs with family physician.  Past medical history,surgical history, family history and social history were all reviewed and documented in the EPIC chart.  Gynecologic History No LMP recorded. Patient is postmenopausal. Contraception: post menopausal status Last Pap: 02/2017. Results were: Negative Last mammogram: 05/2017. Results were: Negative Bone Density: 07/2016 Osteopenia Colonoscopy: 2018  Obstetric History OB History  Gravida Para Term Preterm AB Living  2 2       2   SAB TAB Ectopic Multiple Live Births               # Outcome Date GA Lbr Len/2nd Weight Sex Delivery Anes PTL Lv  2 Para           1 Para              ROS: A ROS was performed and pertinent positives and negatives are included in the history.  GENERAL: No fevers or chills. HEENT: No change in vision, no earache, sore throat or sinus congestion. NECK: No pain or stiffness. CARDIOVASCULAR: No chest pain or pressure. No palpitations. PULMONARY: No shortness of breath, cough or wheeze. GASTROINTESTINAL: No abdominal pain, nausea, vomiting or diarrhea, melena or bright red blood per rectum. GENITOURINARY: No urinary frequency, urgency, hesitancy or dysuria. MUSCULOSKELETAL: No joint or muscle pain, no back pain, no recent trauma. DERMATOLOGIC: No rash, no itching, no lesions. ENDOCRINE: No polyuria, polydipsia, no heat or cold intolerance. No recent change in weight. HEMATOLOGICAL: No anemia or easy bruising or bleeding. NEUROLOGIC: No headache, seizures, numbness, tingling or weakness. PSYCHIATRIC: No depression, no loss of interest  in normal activity or change in sleep pattern.     Exam:   BP 136/84   Ht 5\' 6"  (1.676 m)   Wt 133 lb (60.3 kg)   BMI 21.47 kg/m   Body mass index is 21.47 kg/m.  General appearance : Well developed well nourished female. No acute distress HEENT: Eyes: no retinal hemorrhage or exudates,  Neck supple, trachea midline, no carotid bruits, no thyroidmegaly Lungs: Clear to auscultation, no rhonchi or wheezes, or rib retractions  Heart: Regular rate and rhythm, no murmurs or gallops Breast:Examined in sitting and supine position were symmetrical in appearance, no palpable masses or tenderness,  no skin retraction, no nipple inversion, no nipple discharge, no skin discoloration, no axillary or supraclavicular lymphadenopathy Abdomen: no palpable masses or tenderness, no rebound or guarding Extremities: no edema or skin discoloration or tenderness  Pelvic: Vulva: Normal             Vagina: No gross lesions or discharge  Cervix: No gross lesions or discharge.  Pap reflex done.  Uterus  AV, normal size, shape and consistency, non-tender and mobile  Adnexa  Without masses or tenderness  Anus: Normal   Assessment/Plan:  68 y.o. female for annual exam   1. Encounter for routine gynecological examination with Papanicolaou smear of cervix Normal gynecologic exam in menopause.  Pap reflex done today.  Breast exam normal.  Scheduling screening mammogram now.  Colonoscopy done in 2018, benign polyps, will schedule at 5 years.  Health labs  with family physician.  Good body mass index at 21.47.  Continue with healthy nutrition and fitness.  2. Postmenopausal Well on no hormone replacement therapy.  No postmenopausal bleeding.  3. Osteopenia of multiple sites Continue with vitamin D supplements and calcium intake of 1.2 g/day with regular weightbearing physical activities.  Will repeat bone density in May 2020.    4. Screening for malignant neoplasm of cervix - Pap IG w/ reflex to HPV when  [facility]  Princess Bruins MD, 3:08 PM 05/28/2018

## 2018-05-28 NOTE — Patient Instructions (Signed)
1. Encounter for routine gynecological examination with Papanicolaou smear of cervix Normal gynecologic exam in menopause.  Pap reflex done today.  Breast exam normal.  Scheduling screening mammogram now.  Colonoscopy done in 2018, benign polyps, will schedule at 5 years.  Health labs with family physician.  Good body mass index at 21.47.  Continue with healthy nutrition and fitness.  2. Postmenopausal Well on no hormone replacement therapy.  No postmenopausal bleeding.  3. Osteopenia of multiple sites Continue with vitamin D supplements and calcium intake of 1.2 g/day with regular weightbearing physical activities.  Will repeat bone density in May 2020.    4. Screening for malignant neoplasm of cervix - Pap IG w/ reflex to HPV when [facility]-U  Barbara Odom, it was a pleasure seeing you today!  I will inform you of your results as soon as they are available.

## 2018-05-29 LAB — PAP IG W/ RFLX HPV ASCU

## 2018-06-03 ENCOUNTER — Other Ambulatory Visit: Payer: Self-pay | Admitting: Internal Medicine

## 2018-06-04 NOTE — Telephone Encounter (Signed)
Refilled: 03/01/2018 Last OV: 03/06/2018 Next OV: not scheduled

## 2018-06-12 ENCOUNTER — Other Ambulatory Visit: Payer: Self-pay | Admitting: Internal Medicine

## 2018-06-12 NOTE — Telephone Encounter (Signed)
Refilled: 01/22/2018 Last OV: 03/06/2018 Next OV: not scheduled

## 2018-06-25 ENCOUNTER — Telehealth: Payer: Self-pay | Admitting: Internal Medicine

## 2018-06-25 ENCOUNTER — Telehealth: Payer: Self-pay

## 2018-06-25 NOTE — Telephone Encounter (Signed)
Copied from [place] [id number]. Topic: General - Other >> Jun 25, 2018  3:34 PM Percell Belt A wrote: Reason for CRM: pt called in and stated that the SUMAtriptan (IMITREX) 50 MG tablet [[id number]]  does not work as well and per her ins they will not cover this med anymore.  She would like to know if Dr would put her back on the Rizatriptan 10mg ?    Pharmacy -[id number] [address], [place] [phone] (Phone

## 2018-06-25 NOTE — Telephone Encounter (Signed)
Copied from [place] [id number]. Topic: General - Other >> Jun 25, 2018  3:34 PM Percell Belt A wrote: Reason for CRM: pt called in and stated that the SUMAtriptan (IMITREX) 50 MG tablet [[id number]]  does not work as well and per her ins they will not cover this med anymore.  She would like to know if Dr would put her back on the Rizatriptan 10mg ?    Pharmacy [id number] [address], [place] [phone] (Phone

## 2018-06-26 MED ORDER — RIZATRIPTAN BENZOATE 10 MG PO TABS: 10.0000 mg | ORAL_TABLET | ORAL | 3 refills | Status: DC | PRN

## 2018-06-26 NOTE — Addendum Note (Signed)
Addended by: Crecencio Mc on: 06/26/2018 03:47 PM   Modules accepted: Orders

## 2018-06-26 NOTE — Telephone Encounter (Signed)
Alternative requested and sent to local pharmacy

## 2018-08-06 DIAGNOSIS — M5136 Other intervertebral disc degeneration, lumbar region: Secondary | ICD-10-CM | POA: Diagnosis not present

## 2018-08-17 ENCOUNTER — Telehealth: Payer: Self-pay | Admitting: Pulmonary Disease

## 2018-08-17 MED ORDER — FLUCONAZOLE 100 MG PO TABS: 100.0000 mg | ORAL_TABLET | Freq: Every day | ORAL | 0 refills | Status: AC

## 2018-08-17 NOTE — Telephone Encounter (Signed)
Pt is aware of below message and voiced her understanding. Nothing further is needed. 

## 2018-08-17 NOTE — Telephone Encounter (Signed)
Order placed  [facility]

## 2018-08-17 NOTE — Telephone Encounter (Signed)
Pt reports of white coated tongue and sore throat. Pt has hx of thrush and has been treated with diflucan previously.  Denied fever, chills or sweats. Pt is requesting Rx for Diflucan.  [facility] [address]   DS please advise. Thanks

## 2018-08-30 DIAGNOSIS — D2262 Melanocytic nevi of left upper limb, including shoulder: Secondary | ICD-10-CM | POA: Diagnosis not present

## 2018-08-30 DIAGNOSIS — D2272 Melanocytic nevi of left lower limb, including hip: Secondary | ICD-10-CM | POA: Diagnosis not present

## 2018-08-30 DIAGNOSIS — D225 Melanocytic nevi of trunk: Secondary | ICD-10-CM | POA: Diagnosis not present

## 2018-08-30 DIAGNOSIS — D2261 Melanocytic nevi of right upper limb, including shoulder: Secondary | ICD-10-CM | POA: Diagnosis not present

## 2018-08-30 DIAGNOSIS — D2271 Melanocytic nevi of right lower limb, including hip: Secondary | ICD-10-CM | POA: Diagnosis not present

## 2018-08-30 DIAGNOSIS — L400 Psoriasis vulgaris: Secondary | ICD-10-CM | POA: Diagnosis not present

## 2018-09-04 ENCOUNTER — Other Ambulatory Visit: Payer: Self-pay | Admitting: Internal Medicine

## 2018-09-04 DIAGNOSIS — G40909 Epilepsy, unspecified, not intractable, without status epilepticus: Secondary | ICD-10-CM

## 2018-09-04 DIAGNOSIS — Z79899 Other long term (current) drug therapy: Secondary | ICD-10-CM

## 2018-09-05 ENCOUNTER — Encounter: Payer: Self-pay | Admitting: Internal Medicine

## 2018-09-05 ENCOUNTER — Ambulatory Visit (INDEPENDENT_AMBULATORY_CARE_PROVIDER_SITE_OTHER): Payer: Medicare Other | Admitting: Internal Medicine

## 2018-09-05 ENCOUNTER — Other Ambulatory Visit: Payer: Self-pay

## 2018-09-05 DIAGNOSIS — Z1211 Encounter for screening for malignant neoplasm of colon: Secondary | ICD-10-CM

## 2018-09-05 DIAGNOSIS — G40909 Epilepsy, unspecified, not intractable, without status epilepticus: Secondary | ICD-10-CM

## 2018-09-05 DIAGNOSIS — A150 Tuberculosis of lung: Secondary | ICD-10-CM

## 2018-09-05 NOTE — Progress Notes (Signed)
Virtual Visit via [email or web address]   This visit type was conducted due to national recommendations for restrictions regarding the COVID-19 pandemic (e.g. social distancing).  This format is felt to be most appropriate for this patient at this time.  All issues noted in this document were discussed and addressed.  No physical exam was performed (except for noted visual exam findings with Video Visits).   I connected with@ on 09/05/18 at 11:30 AM EDT by a video enabled telemedicine application  and verified that I am speaking with the correct person using two identifiers. Location patient: home Location provider: work or home office Persons participating in the virtual visit: patient, provider  I discussed the limitations, risks, security and privacy concerns of performing an evaluation and management service by telephone and the availability of in person appointments. I also discussed with the patient that there may be a patient responsible charge related to this service. The patient expressed understanding and agreed to proceed.  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  The patient has no signs or symptoms of COVID 19 infection (fever, cough, sore throat  or shortness of breath beyond what is typical for patient).  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 She has had 2 episodes of pneumonia in the last 2 years , one requiring hospitalization and at least 4 other respiratory infections requiring use of antibiotics.     ROS: See pertinent positives and negatives per HPI.  Past Medical History:  Diagnosis Date  . 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 History of diverticulitis of colon   . Migraine headache   . Osteopenia   . Pneumonia    multi-lobe   . seasonal rhinitis     Past Surgical History:  Procedure Laterality Date  . BREAST BIOPSY Right 2005   Negative- clip placed  . BREAST EXCISIONAL BIOPSY Left 1997   Negative --- Pt states this procedure was a lumpect.  [facility] BREAST SURGERY     lumpectomy, benign   . CARPAL TUNNEL RELEASE  1983   bilateral   . CERVICAL DISCECTOMY  August 2012   [place]    Family History  Problem Relation Age of Onset  . Heart disease Mother        Atrial fibrillation  . Cancer Maternal Grandmother        colon Ca  . Cancer Maternal Grandfather        Colon CA    SOCIAL HX:  reports that she has never smoked. She has never used smokeless tobacco. She reports current alcohol use. She reports that she does not use drugs.   Current Outpatient Medications:  .  albuterol (PROVENTIL HFA;VENTOLIN HFA) 108 (90 Base) MCG/ACT inhaler, Inhale 1-2 puffs into the lungs every 4 (four) hours as needed for wheezing or shortness of breath., Disp: 1 Inhaler, Rfl: 10 .  calcium-vitamin D (OSCAL WITH D) 500-200 MG-UNIT tablet, Take 1 tablet by mouth 2 (two) times daily., Disp: , Rfl:  .  Cholecalciferol (VITAMIN D3) 1000 UNITS CAPS, Take by mouth., Disp: , Rfl:  .  Cranberry 500 MG TABS, Take 1 tablet by  mouth daily., Disp: , Rfl:  .  FLOVENT HFA 110 MCG/ACT inhaler, TAKE 2 PUFFS BY MOUTH TWICE A DAY, Disp: , Rfl:  .  Multiple Vitamin (MULTIVITAMIN) tablet, Take 1 tablet by mouth daily.  , Disp: , Rfl:  .  Probiotic Product (SOLUBLE FIBER/PROBIOTICS PO), Take 1 capsule by mouth daily., Disp: , Rfl:  .  Respiratory Therapy Supplies (FLUTTER) DEVI, 1 each by Does not apply route 4 (four) times daily., Disp: 1 each, Rfl: 0 .  rizatriptan (MAXALT) 10 MG tablet, Take 1 tablet (10 mg total) by mouth as needed for migraine. May repeat in 2 hours if needed, Disp: 30 tablet, Rfl: 3 .  SUMAtriptan (IMITREX) 50 MG  tablet, TAKE 1 TAB EVERY 2HOURS AS NEEDED MIGRAINE. MAY REPEAT IN 2HOURS IF HEADACHE PERSISTS OR RECURS., Disp: 10 tablet, Rfl: 0 .  zolpidem (AMBIEN) 10 MG tablet, TAKE 1/2 TABLET BY MOUTH EVERY DAY AT BEDTIME, Disp: 30 tablet, Rfl: 5 .  PHENObarbital (LUMINAL) 60 MG tablet, TAKE 1 TABLET BY MOUTH EVERY DAY, Disp: 90 tablet, Rfl: 1  EXAM:  VITALS per patient if applicable:  GENERAL: alert, oriented, appears well and in no acute distress  HEENT: atraumatic, conjunttiva clear, no obvious abnormalities on inspection of external nose and ears  NECK: normal movements of the head and neck  LUNGS: on inspection no signs of respiratory distress, breathing rate appears normal, no obvious gross SOB, gasping or wheezing  CV: no obvious cyanosis  MS: moves all visible extremities without noticeable abnormality  PSYCH/NEURO: pleasant and cooperative, no obvious depression or anxiety, speech and thought processing grossly intact  ASSESSMENT AND PLAN:  Discussed the following assessment and plan:  Seizure disorder Since adolescence.  Has been seizure free on low dose phenobarbital with no adverse effects. Phenobarbital level needs checking and has been ordered.   Screening for colon cancer 5 yr intervals due to [place] .  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 Advised against returning to work in a clinical environment during the Coronavirus pandemic     I discussed the assessment and treatment plan with the patient. The patient was provided an opportunity to ask questions and all were answered. The patient agreed with the plan and demonstrated an understanding of the instructions.   The patient was advised to call back or seek an in-person evaluation if the symptoms worsen or if the condition fails to improve as anticipated.  I provided 25 minutes of non-face-to-face time during this  encounter.   Crecencio Mc, MD

## 2018-09-05 NOTE — Telephone Encounter (Signed)
Refilled: 06/04/2018 Last OV: 03/06/2018 Next OV: 09/05/2018

## 2018-09-06 DIAGNOSIS — A150 Tuberculosis of lung: Secondary | ICD-10-CM | POA: Insufficient documentation

## 2018-09-06 DIAGNOSIS — J479 Bronchiectasis, uncomplicated: Secondary | ICD-10-CM | POA: Insufficient documentation

## 2018-09-06 NOTE — Assessment & Plan Note (Signed)
She has not had bronchoscopy to confirm. Advised against returning to work in a clinical environment during the Coronavirus pandemic

## 2018-09-06 NOTE — Assessment & Plan Note (Signed)
Since adolescence.  Has been seizure free on low dose phenobarbital with no adverse effects. Phenobarbital level needs checking and has been ordered.

## 2018-09-06 NOTE — Assessment & Plan Note (Signed)
5 yr intervals due to [place] .  Last one was in 2018

## 2018-10-29 DIAGNOSIS — M5136 Other intervertebral disc degeneration, lumbar region: Secondary | ICD-10-CM | POA: Diagnosis not present

## 2018-10-31 ENCOUNTER — Telehealth: Payer: Self-pay | Admitting: *Deleted

## 2018-10-31 DIAGNOSIS — M8589 Other specified disorders of bone density and structure, multiple sites: Secondary | ICD-10-CM

## 2018-10-31 NOTE — Telephone Encounter (Signed)
Patient called requesting order placed at breast center [facility] order placed. Patient aware she can call to schedule.

## 2018-11-12 ENCOUNTER — Ambulatory Visit
Admission: RE | Admit: 2018-11-12 | Discharge: 2018-11-12 | Disposition: A | Discharge status: Home / Self Care | Payer: Medicare Other | Source: Ambulatory Visit | Attending: Obstetrics & Gynecology | Admitting: Obstetrics & Gynecology

## 2018-11-12 ENCOUNTER — Ambulatory Visit: Payer: Medicare Other | Admitting: Pulmonary Disease

## 2018-11-12 DIAGNOSIS — Z1231 Encounter for screening mammogram for malignant neoplasm of breast: Secondary | ICD-10-CM | POA: Insufficient documentation

## 2018-11-22 ENCOUNTER — Other Ambulatory Visit: Payer: Self-pay | Admitting: Internal Medicine

## 2018-11-22 MED ORDER — FLUCONAZOLE 150 MG PO TABS: 150.0000 mg | ORAL_TABLET | Freq: Every day | ORAL | 0 refills | Status: DC

## 2018-11-29 ENCOUNTER — Telehealth: Payer: Self-pay | Admitting: Pulmonary Disease

## 2018-11-29 NOTE — Telephone Encounter (Signed)
appt has been changed to phone visit

## 2018-11-29 NOTE — Telephone Encounter (Signed)
Called and spoke pt, who is requesting acute visit for Monday or Tuesday of next week, as she is currently in [facility]  Pt completed course of doxycycline yesterday with some improvement, however she still has prod cough with light green mucus, voice hoarseness, wheezing and sob with exertion. Pt stated that she knew she would not feel better when she returns home and would like to be proactive.  symptoms have been present for 7-8 days.  Pt has been scheduled for acute visit on 12/03/2018, I have offered phone visit due to symptoms and recent travel. Pt would prefer to come in and have Dr. Patsey Berthold listen to her lung.   LG please advise if okay to keep appt in office, or does it need to be switched to phone?

## 2018-11-29 NOTE — Telephone Encounter (Signed)
Left message for pt

## 2018-11-29 NOTE — Telephone Encounter (Signed)
She is coming from an area with high COVID risk and would not be advisable to come into office. I would recommend tele visist or eval at ED equipped with ability to check for [place].

## 2018-12-03 ENCOUNTER — Encounter: Payer: Self-pay | Admitting: Pulmonary Disease

## 2018-12-03 ENCOUNTER — Ambulatory Visit: Payer: Medicare Other | Admitting: Pulmonary Disease

## 2018-12-03 ENCOUNTER — Other Ambulatory Visit: Payer: Self-pay

## 2018-12-03 DIAGNOSIS — J4550 Severe persistent asthma, uncomplicated: Secondary | ICD-10-CM

## 2018-12-03 DIAGNOSIS — B370 Candidal stomatitis: Secondary | ICD-10-CM

## 2018-12-03 DIAGNOSIS — J471 Bronchiectasis with (acute) exacerbation: Secondary | ICD-10-CM

## 2018-12-03 MED ORDER — CLARITHROMYCIN 500 MG PO TABS: 500.0000 mg | ORAL_TABLET | Freq: Two times a day (BID) | ORAL | 0 refills | Status: AC

## 2018-12-03 NOTE — Patient Instructions (Signed)
1.  You have a bronchiectasis exacerbation.  We will treat this with Biaxin 500 mg twice a day for 7 days.  You may take the medication with food.  2.  With regards to the recurrent issues with thrush, unfortunately Flovent is associated with these issues.  I recommend rinsing with.  3. We will see you in 3-4 wks.

## 2018-12-03 NOTE — Progress Notes (Signed)
 Assessment & Plan:        Assessment & Plan:  1. Bronchiectasis with acute exacerbation (HCC) (Primary)  2. Severe persistent asthma, unspecified whether complicated  3. Thrush   Patient Instructions  1.  You have a bronchiectasis exacerbation.  We will treat this with Biaxin  500 mg twice a day for 7 days.  You may take the medication with food.  2.  With regards to the recurrent issues with thrush, unfortunately Flovent  is associated with these issues.  I recommend rinsing with.  3. We will see you in 3-4 wks.    Please note: late entry documentation due to logistical difficulties during COVID-19 pandemic. This note is filed for information purposes only, and is not intended to be used for billing, nor does it represent the full scope/nature of the visit in question. Please see any associated scanned media linked to date of encounter for additional pertinent information.  Subjective:    HPI: Barbara Odom is a 68 y.o. female presenting to the pulmonology clinic on 12/03/2018 with report of: No chief complaint on file.     This visit type was conducted due to national recommendations for restrictions regarding the COVID-19 pandemic .  This format is felt to be most appropriate for this patient at this time.  All issues noted in this document were discussed and addressed.  No physical exam was performed (except for noted visual exam findings with Video Visits).    I connected with [name] by a video enabled telemedicine application or telephone at 1539 hrs. and verified that I was speaking with the correct person using two identifiers. Location patient: home Location provider: [facility]- Persons participating in the virtual visit: patient, physician   I discussed the limitations, risks, security and privacy concerns of performing an evaluation and management service by video or telephone and the availability of in person appointments.  The patient expressed understanding and agreed to proceed.  Mode of visit underlined above.      Outpatient Encounter Medications as of 12/03/2018  Medication Sig   [EXPIRED] clarithromycin  (BIAXIN ) 500 MG tablet Take 1 tablet (500 mg total) by mouth 2 (two) times daily for 7 days.   Probiotic Product (SOLUBLE FIBER/PROBIOTICS PO) Take 1 capsule by mouth daily.   [DISCONTINUED] albuterol  (PROVENTIL  HFA;VENTOLIN  HFA) 108 (90 Base) MCG/ACT inhaler Inhale 1-2 puffs into the lungs every 4 (four) hours as needed for wheezing or shortness of breath.   [DISCONTINUED] calcium -vitamin D  (OSCAL WITH D) 500-200 MG-UNIT tablet Take 1 tablet by mouth 2 (two) times daily.   [DISCONTINUED] Cholecalciferol (VITAMIN D3) 1000 UNITS CAPS Take by mouth.   [DISCONTINUED] Cranberry 500 MG TABS Take 1 tablet by mouth daily.   [DISCONTINUED] diazepam  (VALIUM ) 5 MG tablet Take 5 mg by mouth every 8 (eight) hours as needed.   [DISCONTINUED] FLOVENT  HFA 110 MCG/ACT inhaler TAKE 2 PUFFS BY MOUTH TWICE A DAY   [DISCONTINUED] fluconazole  (DIFLUCAN ) 150 MG tablet Take 1 tablet (150 mg total) by mouth daily. (Patient not taking: Reported on 01/03/2019)   [DISCONTINUED] Multiple Vitamin (MULTIVITAMIN) tablet Take 1 tablet by mouth daily.   [DISCONTINUED] PHENObarbital  (LUMINAL) 60 MG tablet TAKE 1 TABLET BY MOUTH EVERY DAY   [DISCONTINUED] Respiratory Therapy Supplies (FLUTTER) DEVI 1 each by Does not apply route 4 (four) times daily.   [DISCONTINUED] rizatriptan  (MAXALT ) 10 MG tablet Take 1 tablet (10 mg total) by mouth as needed for migraine. May repeat in 2 hours  if needed   [DISCONTINUED] SUMAtriptan  (IMITREX ) 50 MG tablet TAKE 1 TAB EVERY 2HOURS AS NEEDED MIGRAINE. MAY REPEAT IN 2HOURS IF HEADACHE PERSISTS OR RECURS. (Patient not taking: Reported on 01/03/2019)   [DISCONTINUED] zolpidem  (AMBIEN ) 10 MG tablet TAKE 1/2 TABLET BY MOUTH EVERY DAY AT BEDTIME   No facility-administered encounter medications on file as of  12/03/2018.     Total visit time 14 minutes.

## 2018-12-10 ENCOUNTER — Other Ambulatory Visit: Payer: Self-pay | Admitting: Internal Medicine

## 2018-12-12 ENCOUNTER — Ambulatory Visit: Payer: Medicare Other | Admitting: Pulmonary Disease

## 2019-01-02 ENCOUNTER — Other Ambulatory Visit: Payer: Self-pay

## 2019-01-02 ENCOUNTER — Ambulatory Visit (INDEPENDENT_AMBULATORY_CARE_PROVIDER_SITE_OTHER): Payer: Medicare Other

## 2019-01-02 DIAGNOSIS — Z23 Encounter for immunization: Secondary | ICD-10-CM | POA: Diagnosis not present

## 2019-01-03 ENCOUNTER — Ambulatory Visit: Payer: Medicare Other | Admitting: Pulmonary Disease

## 2019-01-03 ENCOUNTER — Telehealth: Payer: Self-pay | Admitting: Pulmonary Disease

## 2019-01-03 ENCOUNTER — Encounter: Payer: Self-pay | Admitting: Pulmonary Disease

## 2019-01-03 VITALS — BP 124/80 | HR 85 | Temp 98.9°F | Ht 67.0 in | Wt 131.0 lb

## 2019-01-03 DIAGNOSIS — J4540 Moderate persistent asthma, uncomplicated: Secondary | ICD-10-CM

## 2019-01-03 DIAGNOSIS — J471 Bronchiectasis with (acute) exacerbation: Secondary | ICD-10-CM

## 2019-01-03 MED ORDER — DOXYCYCLINE HYCLATE 100 MG PO CAPS: 100.0000 mg | ORAL_CAPSULE | Freq: Two times a day (BID) | ORAL | 0 refills | Status: DC

## 2019-01-03 NOTE — Telephone Encounter (Signed)
Verbal given per Dr. Patsey Berthold okay to take, patient has taken in the past, patient preference.   [facility] [place] spoke with tech Delana Meyer made aware okay for patient to have. Voiced understanding. Nothing further needed at this time.

## 2019-01-03 NOTE — Patient Instructions (Addendum)
1.  Prescription for doxycycline was sent to your pharmacy.  2.  Follow-up in 3 months time.  Continue your same regimen.

## 2019-01-07 ENCOUNTER — Other Ambulatory Visit: Payer: Self-pay | Admitting: Internal Medicine

## 2019-01-07 ENCOUNTER — Other Ambulatory Visit: Payer: Self-pay

## 2019-01-07 ENCOUNTER — Ambulatory Visit (INDEPENDENT_AMBULATORY_CARE_PROVIDER_SITE_OTHER): Payer: Medicare Other

## 2019-01-07 DIAGNOSIS — Z780 Asymptomatic menopausal state: Secondary | ICD-10-CM

## 2019-01-07 DIAGNOSIS — Z0000 Encounter for general adult medical examination without abnormal findings: Secondary | ICD-10-CM | POA: Diagnosis not present

## 2019-01-07 NOTE — Patient Instructions (Addendum)
  Barbara Odom , Thank you for taking time to come for your Medicare Wellness Visit. I appreciate your ongoing commitment to your health goals. Please review the following plan we discussed and let me know if I can assist you in the future.   These are the goals we discussed: Goals      Patient Stated   . Increase physical activity (pt-stated)     I want to be more consistent with walking.       This is a list of the screening recommended for you and due dates:  Health Maintenance  Topic Date Due  . Pneumonia vaccines (1 of 2 - PCV13) 09/24/2017  . Mammogram  11/12/2019  . Colon Cancer Screening  03/06/2022  . Tetanus Vaccine  01/24/2023  . Flu Shot  Completed  . DEXA scan (bone density measurement)  Completed  .  Hepatitis C: One time screening is recommended by Center for Disease Control  (CDC) for  adults born from [birth year] through 1963.   Completed

## 2019-01-07 NOTE — Progress Notes (Signed)
Subjective:   Barbara Odom is a 68 y.o. female who presents for an Initial Medicare Annual Wellness Visit.  Review of Systems    No ROS.  Medicare Wellness Virtual Visit.  Visual/audio telehealth visit, UTA vital signs.   See social history for additional risk factors.    Cardiac Risk Factors include: advanced age (>28men, >68 women)     Objective:    Today's Vitals   There is no height or weight on file to calculate BMI.  Advanced Directives 01/07/2019 09/29/2017 10/07/2016  Does Patient Have a Medical Advance Directive? Yes No No  Type of Paramedic of [place];Living will - -  Does patient want to make changes to medical advance directive? No - Patient declined - -  Copy of [place] in Chart? No - copy requested - -  Would patient like information on creating a medical advance directive? - - No - Patient declined    Current Medications (verified) Outpatient Encounter Medications as of 01/07/2019  Medication Sig   albuterol (PROVENTIL HFA;VENTOLIN HFA) 108 (90 Base) MCG/ACT inhaler Inhale 1-2 puffs into the lungs every 4 (four) hours as needed for wheezing or shortness of breath.   Biotin 5000 MCG CAPS Take 1 capsule by mouth. Daily   calcium-vitamin D (OSCAL WITH D) 500-200 MG-UNIT tablet Take 1 tablet by mouth 2 (two) times daily.   Cholecalciferol (VITAMIN D3) 1000 UNITS CAPS Take by mouth.   Cranberry-Vitamin C (CRANBERRY CONCENTRATE/VITAMINC PO) Take 4,200 mg by mouth daily.   diazepam (VALIUM) 5 MG tablet Take 5 mg by mouth every 8 (eight) hours as needed.   Fexofenadine HCl (ALLEGRA PO) Take 10 mg by mouth daily.   FLOVENT HFA 110 MCG/ACT inhaler TAKE 2 PUFFS BY MOUTH TWICE A DAY   GLUCOSAMINE-CHONDROITIN PO Take 1 tablet by mouth daily.   Magnesium 400 MG CAPS Take 1 capsule by mouth daily.   Multiple Vitamin (MULTIVITAMIN) tablet Take 1 tablet by mouth daily.     omega-3 fish oil (MAXEPA) 1000 MG CAPS  capsule Take 1 capsule by mouth daily.   PHENObarbital (LUMINAL) 60 MG tablet TAKE 1 TABLET BY MOUTH EVERY DAY   Probiotic Product (SOLUBLE FIBER/PROBIOTICS PO) Take 1 capsule by mouth daily.   Respiratory Therapy Supplies (FLUTTER) DEVI 1 each by Does not apply route 4 (four) times daily.   rizatriptan (MAXALT) 10 MG tablet TAKE 1 TABLET BY MOUTH AS NEEDED FOR MIGRAINE. MAY REPEAT IN 2 HOURS IF NEEDED   vitamin B-12 (CYANOCOBALAMIN) 1000 MCG tablet Take 1,000 mcg by mouth daily.   zolpidem (AMBIEN) 10 MG tablet TAKE 1/2 TABLET BY MOUTH EVERY DAY AT BEDTIME   [DISCONTINUED] Cranberry 500 MG TABS Take 1 tablet by mouth daily.   [DISCONTINUED] doxycycline (VIBRAMYCIN) 100 MG capsule Take 1 capsule (100 mg total) by mouth 2 (two) times daily for 10 days.   No facility-administered encounter medications on file as of 01/07/2019.     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   BREAST SURGERY     lumpectomy, benign    CARPAL TUNNEL RELEASE  1983   bilateral    CERVICAL DISCECTOMY  August 2012   [place]   Family History  Problem Relation Age of Onset   Heart disease Mother        Atrial fibrillation   Coronary artery disease Mother    Cancer Maternal Grandmother        colon Ca   Cancer Maternal Grandfather        Colon CA   Macular degeneration Father    Congestive Heart Failure Father    Social History   Socioeconomic History   Marital status: Married    Spouse name: Not on file   Number of children: Not on file   Years of education:  Not on file   Highest education level: Not on file  Occupational History   Occupation: BEHAVIOR MED    Employer: [employer]  Social Needs   Financial resource strain: Not hard at all   Food insecurity    Worry: Never true    Inability: Never true   Transportation needs    Medical: No    Non-medical: No  Tobacco Use   Smoking status: Never Smoker   Smokeless tobacco: Never Used  Substance and Sexual Activity   Alcohol use: Yes    Comment: socially    Drug use: No   Sexual activity: Yes    Partners: Male    Comment: 1st intercourse- 58, partners- 62, married- 51 yrs   Lifestyle   Physical activity    Days per week: 4 days    Minutes per session: 60 min   Stress: Not at all  Relationships   Social connections    Talks on phone: Not on file    Gets together: Not on file    Attends religious service: Not on file    Active member of club or organization: Not on file    Attends meetings of clubs or organizations: Not on file    Relationship status: Not on file  Other Topics Concern   Not on file  Social History Narrative   Not on file    Tobacco Counseling Counseling given: Not Answered   Clinical Intake:  Pre-visit preparation completed: No        Diabetes: No  How often do you need to have someone help you when you read instructions, pamphlets, or other written materials from your doctor or pharmacy?: 1 - Never  Interpreter Needed?: No      Activities of Daily Living In your present state of health, do you have any difficulty performing the following activities: 01/07/2019  Hearing? N  Vision? N  Difficulty concentrating or making decisions? N  Walking or climbing stairs? N  Dressing or bathing? N  Doing errands, shopping? N  Preparing Food and eating ? N  Using the Toilet? N  In the past six months, have you accidently leaked urine? N  Do you have problems with loss of bowel control? N  Managing your Medications?  N  Managing your Finances? N  Housekeeping or managing your Housekeeping? N  Some recent data might be hidden     Immunizations and Health Maintenance Immunization History  Administered Date(s) Administered   Fluad Quad(high Dose 65+) 01/02/2019   Influenza Split 02/05/2012, 12/19/2012   Tdap 01/23/2013   Health Maintenance Due  Topic Date Due   PNA vac Low Risk Adult (1  of 2 - PCV13) 09/24/2017    Patient Care Team: Crecencio Mc, MD as PCP - General (Internal Medicine)  Indicate any recent Medical Services you may have received from other than [facility] providers in the past year (date may be approximate).     Assessment:   This is a routine wellness examination for [place].  I connected with patient 01/07/19 at  1:00 PM EDT by an audio enabled telemedicine application and verified that I am speaking with the correct person using two identifiers. Patient stated full name and DOB. Patient gave permission to continue with virtual visit. Patient's location was at home and Nurse's location was at [place] office.   Health Maintenance Due: -PNA - discussed; plans to receive at next scheduled office visit.    -Dexa Scan- ordered; she plans to schedule.  Update all pending maintenance due as appropriate.   See completed HM at the end of note.   Eye: Visual acuity not assessed. Virtual visit. Wears corrective lenses. Followed by their ophthalmologist every 12 months.   Dental: Visits every 6 months.    Hearing: Demonstrates normal hearing during visit.  Safety:  Patient feels safe at home- yes Patient does have smoke detectors at home- yes Patient does wear sunscreen or protective clothing when in direct sunlight - yes Patient does wear seat belt when in a moving vehicle - yes Patient drives- yes Adequate lighting in walkways free from debris- yes Grab bars and handrails used as appropriate- yes Ambulates with no assistive device Cell phone or lifeline/life  alert/medic alert on person when ambulating outside of the home- yes  Social: Alcohol intake - yes      Smoking history- never   Smokers in home? none Illicit drug use? none  Depression: PHQ 2 &9 complete. See screening below. Denies irritability, anhedonia, sadness/tearfullness.   Falls: See screening below.    Medication: Taking as directed and without issues.   Covid-19: Precautions and sickness symptoms discussed. Wears mask, social distancing, hand hygiene as appropriate.   Activities of Daily Living Patient denies needing assistance with: household chores, feeding themselves, getting from bed to chair, getting to the toilet, bathing/showering, dressing, managing money, or preparing meals.    Memory: Patient is alert. Patient denies difficulty focusing or concentrating. Correctly identified the president of the [place], season and recall. Patient likes to read, play sodoku and complete word puzzles for brain stimulation.  BMI- discussed the importance of a healthy diet, water intake and the benefits of aerobic exercise.  Educational material provided.  Physical activity- walking days weekly, 2-3 miles  Diet: healthy; fruits/vegetables Water: good intake  Other Providers Patient Care Team: Crecencio Mc, MD as PCP - General (Internal Medicine)  Hearing/Vision screen  Hearing Screening   125Hz  250Hz  500Hz  1000Hz  2000Hz  3000Hz  4000Hz  6000Hz  8000Hz   Right ear:           Left ear:           Comments: Patient is able to hear conversational tones without difficulty.  No issues reported.  Vision Screening Comments: Followed by [facility] Visual acuity not assessed, virtual visit.  They have seen their ophthalmologist in the last 12 months.     Dietary issues and exercise activities discussed: Current Exercise Habits: Home exercise routine, Type of exercise: walking, Time (Minutes): 60, Frequency (Times/Week): 4, Weekly Exercise (Minutes/Week): 240, Intensity:  Mild  Goals      Patient Stated    Increase physical activity (pt-stated)     I  want to be more consistent with walking.      Depression Screen PHQ 2/9 Scores 01/07/2019 11/16/2014 03/24/2014  PHQ - 2 Score 0 0 0  Some encounter information is confidential and restricted. Go to Review Flowsheets activity to see all data.    Fall Risk Fall Risk  01/07/2019 11/16/2014 03/24/2014  Falls in the past year? 0 No No  Number falls in past yr: 0 - -    Timed Get Up and Go Performed no, virtual visit  Cognitive Function:     6CIT Screen 01/07/2019  What Year? 0 points  What month? 0 points  What time? 0 points  Count back from 20 0 points  Months in reverse 0 points  Repeat phrase 0 points  Total Score 0    Screening Tests Health Maintenance  Topic Date Due   PNA vac Low Risk Adult (1 of 2 - PCV13) 09/24/2017   MAMMOGRAM  11/12/2019   COLONOSCOPY  03/06/2022   TETANUS/TDAP  01/24/2023   INFLUENZA VACCINE  Completed   DEXA SCAN  Completed   Hepatitis C Screening  Completed    Qualifies for Shingles Vaccine? She has Rx and plans to receive at her local pharmacy.   Plan:   Keep all routine maintenance appointments.   [facility] 02/12/19 @ 1:30. Receive Prevnar 13.   Medicare Attestation I have personally reviewed: The patient's medical and social history Their use of alcohol, tobacco or illicit drugs Their current medications and supplements The patient's functional ability including ADLs,fall risks, home safety risks, cognitive, and hearing and visual impairment Diet and physical activities Evidence for depression   In addition, I have reviewed and discussed with patient certain preventive protocols, quality metrics, and best practice recommendations. A written personalized care plan for preventive services as well as general preventive health recommendations were provided to patient via mail.     Varney Biles, LPN   [id number]

## 2019-01-15 ENCOUNTER — Encounter: Payer: Self-pay | Admitting: Internal Medicine

## 2019-02-08 ENCOUNTER — Other Ambulatory Visit: Payer: Self-pay

## 2019-02-11 DIAGNOSIS — M5136 Other intervertebral disc degeneration, lumbar region: Secondary | ICD-10-CM | POA: Diagnosis not present

## 2019-02-12 ENCOUNTER — Encounter: Payer: Self-pay | Admitting: Internal Medicine

## 2019-02-12 ENCOUNTER — Ambulatory Visit (INDEPENDENT_AMBULATORY_CARE_PROVIDER_SITE_OTHER): Payer: Medicare Other | Admitting: Internal Medicine

## 2019-02-12 ENCOUNTER — Other Ambulatory Visit: Payer: Self-pay

## 2019-02-12 VITALS — BP 150/66 | HR 80 | Temp 96.7°F | Resp 15 | Ht 67.0 in | Wt 133.8 lb

## 2019-02-12 DIAGNOSIS — M545 Low back pain, unspecified: Secondary | ICD-10-CM

## 2019-02-12 DIAGNOSIS — Z79899 Other long term (current) drug therapy: Secondary | ICD-10-CM

## 2019-02-12 DIAGNOSIS — Z1211 Encounter for screening for malignant neoplasm of colon: Secondary | ICD-10-CM

## 2019-02-12 DIAGNOSIS — Z0000 Encounter for general adult medical examination without abnormal findings: Secondary | ICD-10-CM

## 2019-02-12 DIAGNOSIS — G8929 Other chronic pain: Secondary | ICD-10-CM | POA: Diagnosis not present

## 2019-02-12 DIAGNOSIS — J440 Chronic obstructive pulmonary disease with acute lower respiratory infection: Secondary | ICD-10-CM

## 2019-02-12 DIAGNOSIS — E782 Mixed hyperlipidemia: Secondary | ICD-10-CM | POA: Diagnosis not present

## 2019-02-12 DIAGNOSIS — R5383 Other fatigue: Secondary | ICD-10-CM | POA: Diagnosis not present

## 2019-02-12 MED ORDER — ZOLPIDEM TARTRATE 10 MG PO TABS: 10.0000 mg | ORAL_TABLET | Freq: Every evening | ORAL | 5 refills | Status: DC | PRN

## 2019-02-12 NOTE — Patient Instructions (Signed)

## 2019-02-12 NOTE — Progress Notes (Signed)
Patient ID: Danie Binder, female    DOB: [birth year]  Age: 68 y.o. MRN: [id number]  The patient is here for follow up and  management of other chronic and acute problems.  This visit occurred during the SARS-CoV-2 public health emergency.  Safety protocols were in place, including screening questions prior to the visit, additional usage of staff PPE, and extensive cleaning of exam room while observing appropriate contact time as indicated for disinfecting solutions.     March PAP smear done by GYN  [place] August 2020 mammogram done  DEXA scan ordered , last one 2018 -2.0   The risk factors are reflected in the social history.  The roster of all physicians providing medical care to patient - is listed in the Snapshot section of the chart.  Activities of daily living:  The patient is 100% independent in all ADLs: dressing, toileting, feeding as well as independent mobility  Home safety : The patient has smoke detectors in the home. They wear seatbelts.  There are no firearms at home. There is no violence in the home.   There is no risks for hepatitis, STDs or HIV. There is no   history of blood transfusion. They have no travel history to infectious disease endemic areas of the world.  The patient has seen their dentist in the last six month. They have seen their eye doctor in the last year. They admit to slight hearing difficulty with regard to whispered voices and some television programs.  They have deferred audiologic testing in the last year.  They do not  have excessive sun exposure. Discussed the need for sun protection: hats, long sleeves and use of sunscreen if there is significant sun exposure.   Diet: the importance of a healthy diet is discussed. They do have a healthy diet.  The benefits of regular aerobic exercise were discussed. She walks 4 times per week ,  20 minutes.   Depression screen: there are no signs or vegative symptoms of depression- irritability, change in  appetite, anhedonia, sadness/tearfullness.  Cognitive assessment: the patient manages all their financial and personal affairs and is actively engaged. They could relate day,date,year and events; recalled 2/3 objects at 3 minutes; performed clock-face test normally.  The following portions of the patient's history were reviewed and updated as appropriate: allergies, current medications, past family history, past medical history,  past surgical history, past social history  and problem list.  Visual acuity was not assessed per patient preference since she has regular follow up with her ophthalmologist. Hearing and body mass index were assessed and reviewed.   During the course of the visit the patient was educated and counseled about appropriate screening and preventive services including : fall prevention , diabetes screening, nutrition counseling, colorectal cancer screening, and recommended immunizations.    CC: The primary encounter diagnosis was Mixed hyperlipidemia. Diagnoses of Fatigue, unspecified type, Long-term use of high-risk medication, Chronic low back pain without sciatica, unspecified back pain laterality, Bronchitis, chronic obstructive w acute bronchitis ([place]), Screening for colon cancer, and Encounter for preventive health examination were also pertinent to this visit.  Osteopenia ,  Was treated with an oral medication for one year over ten years.   Back pain :Saw Clydell Hakim for an [facility] which was done yesterday to manage low back pain   Every 4 months for the past 3 years.  History Hawra has a past medical history of grand mal, History of diverticulitis of colon, Migraine headache, Osteopenia, Pneumonia, and seasonal rhinitis.  She has a past surgical history that includes Carpal tunnel release (1983); Breast surgery; Cervical discectomy (August 2012); Breast excisional biopsy (Left, 1997); and Breast biopsy (Right, 2005).   Her family history includes Cancer in her  maternal grandfather and maternal grandmother; Congestive Heart Failure in her father; Coronary artery disease in her mother; Heart disease in her mother; Macular degeneration in her father.She reports that she has never smoked. She has never used smokeless tobacco. She reports current alcohol use. She reports that she does not use drugs.  Outpatient Medications Prior to Visit  Medication Sig Dispense Refill  . albuterol (PROVENTIL HFA;VENTOLIN HFA) 108 (90 Base) MCG/ACT inhaler Inhale 1-2 puffs into the lungs every 4 (four) hours as needed for wheezing or shortness of breath. 1 Inhaler 10  . Biotin 5000 MCG CAPS Take 1 capsule by mouth. Daily    . calcium-vitamin D (OSCAL WITH D) 500-200 MG-UNIT tablet Take 1 tablet by mouth 2 (two) times daily.    . Cholecalciferol (VITAMIN D3) 1000 UNITS CAPS Take by mouth.    . Cranberry-Vitamin C (CRANBERRY CONCENTRATE/VITAMINC PO) Take 4,200 mg by mouth daily.    . diazepam (VALIUM) 5 MG tablet Take 5 mg by mouth every 8 (eight) hours as needed.    Marland Kitchen Fexofenadine HCl (ALLEGRA PO) Take 10 mg by mouth daily.    Marland Kitchen FLOVENT HFA 110 MCG/ACT inhaler TAKE 2 PUFFS BY MOUTH TWICE A DAY    . GLUCOSAMINE-CHONDROITIN PO Take 1 tablet by mouth daily.    . Magnesium 400 MG CAPS Take 1 capsule by mouth daily.    . Multiple Vitamin (MULTIVITAMIN) tablet Take 1 tablet by mouth daily.      Marland Kitchen omega-3 fish oil (MAXEPA) 1000 MG CAPS capsule Take 1 capsule by mouth daily.    Marland Kitchen PHENObarbital (LUMINAL) 60 MG tablet TAKE 1 TABLET BY MOUTH EVERY DAY 90 tablet 1  . Probiotic Product (SOLUBLE FIBER/PROBIOTICS PO) Take 1 capsule by mouth daily.    Marland Kitchen Respiratory Therapy Supplies (FLUTTER) DEVI 1 each by Does not apply route 4 (four) times daily. 1 each 0  . rizatriptan (MAXALT) 10 MG tablet TAKE 1 TABLET BY MOUTH AS NEEDED FOR MIGRAINE. MAY REPEAT IN 2 HOURS IF NEEDED 18 tablet 6  . vitamin B-12 (CYANOCOBALAMIN) 1000 MCG tablet Take 1,000 mcg by mouth daily.    Marland Kitchen zolpidem (AMBIEN) 10 MG  tablet TAKE 1/2 TABLET BY MOUTH EVERY DAY AT BEDTIME 30 tablet 5   No facility-administered medications prior to visit.     Review of Systems   Patient denies headaches , fevers, malaise, unintentional weight loss, skin rash, eye pain, sinus congestion and sinus pain, sore throat, dysphagia,  hemoptysis , cough, dyspnea, wheezing, chest pain, palpitations, orthopnea, edema, abdominal pain, nausea, melena, diarrhea, constipation, flank pain, dysuria, hematuria, urinary  Frequency, nocturia, numbness, tingling, seizures,  Focal weakness, Loss of consciousness,  Tremor, insomnia, depression, anxiety, and suicidal ideation.      Objective:  BP (!) 150/66 (BP Location: Left Arm, Patient Position: Sitting, Cuff Size: Normal)   Pulse 80   Temp (!) 96.7 F (35.9 C) (Temporal)   Resp 15   Ht 5\' 7"  (1.702 m)   Wt 133 lb 12.8 oz (60.7 kg)   SpO2 99%   BMI 20.96 kg/m   Physical Exam   General appearance: alert, cooperative and appears stated age Head: Normocephalic, without obvious abnormality, atraumatic Eyes: conjunctivae/corneas clear. PERRL, EOM's intact. Fundi benign. Ears: normal TM's and external ear canals both  ears Nose: Nares normal. Septum midline. Mucosa normal. No drainage or sinus tenderness. Throat: lips, mucosa, and tongue normal; teeth and gums normal Neck: no adenopathy, no carotid bruit, no JVD, supple, symmetrical, trachea midline and thyroid not enlarged, symmetric, no tenderness/mass/nodules Lungs: clear to auscultation bilaterally Breasts: normal appearance, no masses or tenderness Heart: regular rate and rhythm, S1, S2 normal, no murmur, click, rub or gallop Abdomen: soft, non-tender; bowel sounds normal; no masses,  no organomegaly Extremities: extremities normal, atraumatic, no cyanosis or edema Pulses: 2+ and symmetric Skin: Skin color, texture, turgor normal. No rashes or lesions Neurologic: Alert and oriented X 3, normal strength and tone. Normal symmetric  reflexes. Normal coordination and gait.      Assessment & Plan:   Problem List Items Addressed This Visit      Unprioritized   Screening for colon cancer    5 yr intervals due to [place] .  Last one was in 2018      Lumbago without sciatica    discusse current symptoms which are minimal and aggravated by rest , supine position and yard work.  She is receiving periodic ESI .       Bronchitis, chronic obstructive w acute bronchitis ([place])     Secondary to bronchiectasis . Using symbicort and spiriva for persistent productive cough ,      Encounter for preventive health examination    age appropriate education and counseling updated, referrals for preventative services and immunizations addressed, dietary and smoking counseling addressed, most recent labs reviewed.  I have personally reviewed and have noted:  1) the patient's medical and social history 2) The pt's use of alcohol, tobacco, and illicit drugs 3) The patient's current medications and supplements 4) Functional ability including ADL's, fall risk, home safety risk, hearing and visual impairment 5) Diet and physical activities 6) Evidence for depression or mood disorder 7) The patient's height, weight, and BMI have been recorded in the chart  I have made referrals, and provided counseling and education based on review of the above      Fatigue   Relevant Orders   Comprehensive metabolic panel   CBC with Differential/Platelet   TSH    Other Visit Diagnoses    Mixed hyperlipidemia    -  Primary   Relevant Orders   Lipid panel   Long-term use of high-risk medication       Relevant Orders   Phenobarbital level      I have changed Brittny A. Kia's zolpidem. I am also having her maintain her multivitamin, Vitamin D3, Probiotic Product (SOLUBLE FIBER/PROBIOTICS PO), calcium-vitamin D, albuterol, Flutter, PHENObarbital, Flovent HFA, diazepam, rizatriptan, Magnesium, Cranberry-Vitamin C (CRANBERRY CONCENTRATE/VITAMINC  PO), Fexofenadine HCl (ALLEGRA PO), omega-3 fish oil, vitamin B-12, Biotin, and GLUCOSAMINE-CHONDROITIN PO.  Meds ordered this encounter  Medications  . zolpidem (AMBIEN) 10 MG tablet    Sig: Take 1 tablet (10 mg total) by mouth at bedtime as needed for sleep.    Dispense:  30 tablet    Refill:  5    Not to exceed 5 additional fills before 07/21/2018   I provided  25 minutes of non-face-to-face time during this encounter reviewing patient's current problems and procedures,   Providing counseling on the above mentioned problems , and coordination  of care .  Medications Discontinued During This Encounter  Medication Reason  . zolpidem (AMBIEN) 10 MG tablet Reorder    Follow-up: No follow-ups on file.   Crecencio Mc, MD

## 2019-02-15 NOTE — Assessment & Plan Note (Signed)
5 yr intervals due to [place] .  Last one was in 2018

## 2019-02-15 NOTE — Assessment & Plan Note (Signed)

## 2019-02-15 NOTE — Assessment & Plan Note (Signed)
discusse current symptoms which are minimal and aggravated by rest , supine position and yard work.  She is receiving periodic ESI .

## 2019-02-15 NOTE — Assessment & Plan Note (Signed)
Secondary to bronchiectasis . Using symbicort and spiriva for persistent productive cough ,

## 2019-02-22 ENCOUNTER — Other Ambulatory Visit: Payer: Self-pay

## 2019-02-22 ENCOUNTER — Telehealth: Payer: Self-pay | Admitting: Internal Medicine

## 2019-02-22 DIAGNOSIS — R319 Hematuria, unspecified: Secondary | ICD-10-CM

## 2019-02-22 NOTE — Telephone Encounter (Signed)
err

## 2019-02-22 NOTE — Telephone Encounter (Signed)
Pt would like to have a UA when she comes in on 12/8. Order needed please and Thank you!

## 2019-02-25 NOTE — Telephone Encounter (Signed)
Yes, ua with micro and culture ok

## 2019-02-25 NOTE — Telephone Encounter (Signed)
Pt stated she has always had a touch of blood in urine. A Urologist told her years ago to always have it checked once it year and has mentioned to Dr. Derrel Nip before.

## 2019-02-25 NOTE — Telephone Encounter (Signed)
Orders have UA with micro and urine culture have been placed.

## 2019-02-25 NOTE — Telephone Encounter (Signed)
LMTCB. Need to find out why the pt is requesting to have a UA done.

## 2019-02-25 NOTE — Addendum Note (Signed)
Addended by: Adair Laundry on: 02/25/2019 03:15 PM   Modules accepted: Orders

## 2019-02-25 NOTE — Telephone Encounter (Signed)
Is it okay to order a UA when pt comes in for lab work tomorrow?

## 2019-02-26 ENCOUNTER — Other Ambulatory Visit: Payer: Self-pay

## 2019-02-26 ENCOUNTER — Ambulatory Visit (INDEPENDENT_AMBULATORY_CARE_PROVIDER_SITE_OTHER): Payer: Medicare Other

## 2019-02-26 ENCOUNTER — Other Ambulatory Visit (INDEPENDENT_AMBULATORY_CARE_PROVIDER_SITE_OTHER): Payer: Medicare Other

## 2019-02-26 DIAGNOSIS — Z79899 Other long term (current) drug therapy: Secondary | ICD-10-CM

## 2019-02-26 DIAGNOSIS — Z23 Encounter for immunization: Secondary | ICD-10-CM

## 2019-02-26 DIAGNOSIS — E782 Mixed hyperlipidemia: Secondary | ICD-10-CM | POA: Diagnosis not present

## 2019-02-26 DIAGNOSIS — R319 Hematuria, unspecified: Secondary | ICD-10-CM | POA: Diagnosis not present

## 2019-02-26 DIAGNOSIS — R5383 Other fatigue: Secondary | ICD-10-CM

## 2019-02-26 DIAGNOSIS — Z780 Asymptomatic menopausal state: Secondary | ICD-10-CM

## 2019-02-26 LAB — URINALYSIS, ROUTINE W REFLEX MICROSCOPIC
Bilirubin Urine: NEGATIVE
Ketones, ur: NEGATIVE
Leukocytes,Ua: NEGATIVE
Nitrite: NEGATIVE
Specific Gravity, Urine: 1.02 (ref 1.000–1.030)
Total Protein, Urine: NEGATIVE
Urine Glucose: NEGATIVE
Urobilinogen, UA: 0.2 (ref 0.0–1.0)
pH: 6 (ref 5.0–8.0)

## 2019-02-26 LAB — COMPREHENSIVE METABOLIC PANEL
ALT: 12 U/L (ref 0–35)
AST: 17 U/L (ref 0–37)
Albumin: 4.2 g/dL (ref 3.5–5.2)
Alkaline Phosphatase: 60 U/L (ref 39–117)
BUN: 19 mg/dL (ref 6–23)
CO2: 31 mEq/L (ref 19–32)
Calcium: 9.9 mg/dL (ref 8.4–10.5)
Chloride: 98 mEq/L (ref 96–112)
Creatinine, Ser: 0.8 mg/dL (ref 0.40–1.20)
GFR: 71.67 mL/min (ref >60.00)
Glucose, Bld: 93 mg/dL (ref 70–99)
Potassium: 3.6 mEq/L (ref 3.5–5.1)
Sodium: 137 mEq/L (ref 135–145)
Total Bilirubin: 0.6 mg/dL (ref 0.2–1.2)
Total Protein: 7.2 g/dL (ref 6.0–8.3)

## 2019-02-26 LAB — TSH: TSH: 3.83 u[IU]/mL (ref 0.35–4.50)

## 2019-02-26 LAB — CBC WITH DIFFERENTIAL/PLATELET
Basophils Absolute: 0.1 10*3/uL (ref 0.0–0.1)
Basophils Relative: 0.5 % (ref 0.0–3.0)
Eosinophils Absolute: 0.3 10*3/uL (ref 0.0–0.7)
Eosinophils Relative: 2.3 % (ref 0.0–5.0)
HCT: 40.4 % (ref 36.0–46.0)
Hemoglobin: 13.3 g/dL (ref 12.0–15.0)
Lymphocytes Relative: 26 % (ref 12.0–46.0)
Lymphs Abs: 3 10*3/uL (ref 0.7–4.0)
MCHC: 33 g/dL (ref 30.0–36.0)
MCV: 95.1 fl (ref 78.0–100.0)
Monocytes Absolute: 0.8 10*3/uL (ref 0.1–1.0)
Monocytes Relative: 6.6 % (ref 3.0–12.0)
Neutro Abs: 7.5 10*3/uL (ref 1.4–7.7)
Neutrophils Relative %: 64.6 % (ref 43.0–77.0)
Platelets: 306 10*3/uL (ref 150.0–400.0)
RBC: 4.25 Mil/uL (ref 3.87–5.11)
RDW: 13.4 % (ref 11.5–15.5)
WBC: 11.6 10*3/uL — ABNORMAL HIGH (ref 4.0–10.5)

## 2019-02-26 LAB — LIPID PANEL
Cholesterol: 252 mg/dL — ABNORMAL HIGH (ref 0–200)
HDL: 88.8 mg/dL (ref >39.00)
LDL Cholesterol: 151 mg/dL — ABNORMAL HIGH (ref 0–99)
NonHDL: 163.22
Total CHOL/HDL Ratio: 3
Triglycerides: 61 mg/dL (ref 0.0–149.0)
VLDL: 12.2 mg/dL (ref 0.0–40.0)

## 2019-02-28 ENCOUNTER — Ambulatory Visit
Admission: RE | Admit: 2019-02-28 | Discharge: 2019-02-28 | Disposition: A | Discharge status: Home / Self Care | Payer: Medicare Other | Source: Ambulatory Visit | Attending: Internal Medicine | Admitting: Internal Medicine

## 2019-02-28 DIAGNOSIS — Z780 Asymptomatic menopausal state: Secondary | ICD-10-CM | POA: Diagnosis not present

## 2019-02-28 LAB — URINE CULTURE
MICRO NUMBER:: 1175497
SPECIMEN QUALITY:: ADEQUATE

## 2019-02-28 LAB — PHENOBARBITAL LEVEL: Phenobarbital, Serum: 8.5 mg/L — ABNORMAL LOW (ref 15.0–40.0)

## 2019-03-09 ENCOUNTER — Other Ambulatory Visit: Payer: Self-pay | Admitting: Internal Medicine

## 2019-03-09 DIAGNOSIS — G40909 Epilepsy, unspecified, not intractable, without status epilepticus: Secondary | ICD-10-CM

## 2019-03-11 NOTE — Telephone Encounter (Signed)
Refilled: 09/05/2018 Last OV: 02/12/2019 Next OV: not scheduled

## 2019-03-13 DIAGNOSIS — Z20828 Contact with and (suspected) exposure to other viral communicable diseases: Secondary | ICD-10-CM | POA: Diagnosis not present

## 2019-03-14 DIAGNOSIS — Z20828 Contact with and (suspected) exposure to other viral communicable diseases: Secondary | ICD-10-CM | POA: Diagnosis not present

## 2019-03-19 ENCOUNTER — Encounter: Payer: Self-pay | Admitting: Pulmonary Disease

## 2019-03-19 ENCOUNTER — Ambulatory Visit: Payer: Medicare Other | Admitting: Pulmonary Disease

## 2019-03-19 ENCOUNTER — Other Ambulatory Visit: Payer: Self-pay

## 2019-03-19 VITALS — BP 142/82 | HR 83 | Temp 97.1°F | Ht 66.5 in | Wt 135.0 lb

## 2019-03-19 DIAGNOSIS — J4540 Moderate persistent asthma, uncomplicated: Secondary | ICD-10-CM

## 2019-03-19 DIAGNOSIS — J209 Acute bronchitis, unspecified: Secondary | ICD-10-CM

## 2019-03-19 DIAGNOSIS — R05 Cough: Secondary | ICD-10-CM

## 2019-03-19 DIAGNOSIS — R059 Cough, unspecified: Secondary | ICD-10-CM

## 2019-03-19 DIAGNOSIS — J208 Acute bronchitis due to other specified organisms: Secondary | ICD-10-CM

## 2019-03-19 DIAGNOSIS — J471 Bronchiectasis with (acute) exacerbation: Secondary | ICD-10-CM

## 2019-03-19 MED ORDER — PREDNISONE 10 MG (21) PO TBPK: ORAL_TABLET | ORAL | 0 refills | Status: DC

## 2019-03-19 MED ORDER — CLARITHROMYCIN 500 MG PO TABS: 500.0000 mg | ORAL_TABLET | Freq: Two times a day (BID) | ORAL | 0 refills | Status: AC

## 2019-03-19 NOTE — Patient Instructions (Signed)
1.  We will start Biaxin 500 mg twice a day with food for 7 days.  2.  Prednisone taper.  3 we will see you back in 2 weeks time.  Call sooner should any new difficulties arise.

## 2019-03-19 NOTE — Progress Notes (Signed)
 Assessment & Plan:  1. Bronchiectasis with acute exacerbation (HCC) (Primary)  2. Moderate persistent asthma without complication  3. Cough  4. Acute bronchitis due to infection   Patient Instructions  1.  We will start Biaxin  500 mg twice a day with food for 7 days.  2.  Prednisone  taper.  3 we will see you back in 2 weeks time.  Call sooner should any new difficulties arise.  Please note: late entry documentation due to logistical difficulties during COVID-19 pandemic. This note is filed for information purposes only, and is not intended to be used for billing, nor does it represent the full scope/nature of the visit in question. Please see any associated scanned media linked to date of encounter for additional pertinent information.  Subjective:    HPI: Barbara Odom is a 68 y.o. female presenting to the pulmonology clinic on 03/19/2019 with report of: Follow-up (pt feels that may developing PNA. c/o chest discomfort, chest tightness, weakness, chills and prod cough with green mucus x3d)     Outpatient Encounter Medications as of 03/19/2019  Medication Sig   Cranberry-Vitamin C (CRANBERRY CONCENTRATE/VITAMINC PO) Take 4,200 mg by mouth daily.   Probiotic Product (SOLUBLE FIBER/PROBIOTICS PO) Take 1 capsule by mouth daily.   vitamin B-12 (CYANOCOBALAMIN ) 1000 MCG tablet Take 1,000 mcg by mouth daily.   [DISCONTINUED] albuterol  (PROVENTIL  HFA;VENTOLIN  HFA) 108 (90 Base) MCG/ACT inhaler Inhale 1-2 puffs into the lungs every 4 (four) hours as needed for wheezing or shortness of breath.   [DISCONTINUED] Biotin 5000 MCG CAPS Take 1 capsule by mouth. Daily   [DISCONTINUED] calcium -vitamin D  (OSCAL WITH D) 500-200 MG-UNIT tablet Take 1 tablet by mouth 2 (two) times daily.   [DISCONTINUED] cetirizine (ZYRTEC) 10 MG tablet Take 10 mg by mouth daily.   [DISCONTINUED] Cholecalciferol (VITAMIN D3) 1000 UNITS CAPS Take by mouth.   [DISCONTINUED] diazepam  (VALIUM ) 5 MG tablet  Take 5 mg by mouth every 8 (eight) hours as needed.   [DISCONTINUED] doxycycline  (VIBRAMYCIN ) 100 MG capsule Take 100 mg by mouth 2 (two) times daily.   [DISCONTINUED] Fexofenadine HCl (ALLEGRA PO) Take 10 mg by mouth daily.   [DISCONTINUED] FLOVENT  HFA 110 MCG/ACT inhaler TAKE 2 PUFFS BY MOUTH TWICE A DAY   [DISCONTINUED] GLUCOSAMINE-CHONDROITIN PO Take 1 tablet by mouth daily.   [DISCONTINUED] Magnesium 400 MG CAPS Take 1 capsule by mouth daily.   [DISCONTINUED] Multiple Vitamin (MULTIVITAMIN) tablet Take 1 tablet by mouth daily.   [DISCONTINUED] omega-3 fish oil (MAXEPA) 1000 MG CAPS capsule Take 1 capsule by mouth daily.   [DISCONTINUED] PHENObarbital  (LUMINAL) 60 MG tablet TAKE 1 TABLET BY MOUTH EVERY DAY   [DISCONTINUED] Respiratory Therapy Supplies (FLUTTER) DEVI 1 each by Does not apply route 4 (four) times daily.   [DISCONTINUED] rizatriptan  (MAXALT ) 10 MG tablet TAKE 1 TABLET BY MOUTH AS NEEDED FOR MIGRAINE. MAY REPEAT IN 2 HOURS IF NEEDED   [DISCONTINUED] zolpidem  (AMBIEN ) 10 MG tablet Take 1 tablet (10 mg total) by mouth at bedtime as needed for sleep.   [EXPIRED] clarithromycin  (BIAXIN ) 500 MG tablet Take 1 tablet (500 mg total) by mouth 2 (two) times daily for 7 days.   [DISCONTINUED] predniSONE  (STERAPRED UNI-PAK 21 TAB) 10 MG (21) TBPK tablet Take as directed in package   No facility-administered encounter medications on file as of 03/19/2019.      Objective:   Vitals:   03/19/19 1442  BP: (!) 142/82  Pulse: 83  Temp: (!) 97.1 F (36.2 C)  Height:  5' 6.5 (1.689 m)  Weight: 135 lb (61.2 kg)  SpO2: 97%  TempSrc: Temporal  BMI (Calculated): 21.47     Physical exam documentation is limited by delayed entry of information.

## 2019-04-03 ENCOUNTER — Ambulatory Visit: Payer: Medicare Other | Admitting: Pulmonary Disease

## 2019-04-03 ENCOUNTER — Telehealth: Payer: Self-pay | Admitting: Pulmonary Disease

## 2019-04-03 ENCOUNTER — Encounter: Payer: Self-pay | Admitting: Pulmonary Disease

## 2019-04-03 DIAGNOSIS — J471 Bronchiectasis with (acute) exacerbation: Secondary | ICD-10-CM

## 2019-04-03 DIAGNOSIS — B370 Candidal stomatitis: Secondary | ICD-10-CM

## 2019-04-03 DIAGNOSIS — J4540 Moderate persistent asthma, uncomplicated: Secondary | ICD-10-CM

## 2019-04-03 MED ORDER — DOXYCYCLINE HYCLATE 100 MG PO CAPS: 100.0000 mg | ORAL_CAPSULE | Freq: Two times a day (BID) | ORAL | 1 refills | Status: DC

## 2019-04-03 MED ORDER — FLUCONAZOLE 100 MG PO TABS: 100.0000 mg | ORAL_TABLET | Freq: Every day | ORAL | 0 refills | Status: AC

## 2019-04-03 MED ORDER — BREO ELLIPTA 200-25 MCG/INH IN AEPB: 1.0000 | INHALATION_SPRAY | Freq: Every day | RESPIRATORY_TRACT | 0 refills | Status: DC

## 2019-04-03 NOTE — Progress Notes (Signed)
 Assessment & Plan:  1. Bronchiectasis with resolving acute exacerbation (HCC) (Primary)  2. Moderate persistent asthma without complication  3. Thrush   Patient Instructions  1.  Sending doxycycline  to the pharmacy of 100 mg twice a day for 10 days  2.  Diflucan  is been sent to your pharmacy  3.  Samples of Breo 200/25 1 inhalation daily for 14 days let us  know how that does for you  4.  We will see you in follow-up in 4 to 6 weeks time.  Should be an in person visit.  Please note: late entry documentation due to logistical difficulties during COVID-19 pandemic. This note is filed for information purposes only, and is not intended to be used for billing, nor does it represent the full scope/nature of the visit in question. Please see any associated scanned media linked to date of encounter for additional pertinent information.  Subjective:    HPI: Barbara Odom is a 69 y.o. female presenting to the pulmonology clinic on 04/03/2019 with report of: No chief complaint on file.   Virtual Visit Via Video or Telephone Note:   This visit type was conducted due to national recommendations for restrictions regarding the COVID-19 pandemic .  This format is felt to be most appropriate for this patient at this time.  All issues noted in this document were discussed and addressed.  No physical exam was performed (except for noted visual exam findings with Video Visits).    I connected with Barbara Odom by a video enabled telemedicine application or telephone at 1:30 PM and verified that I was speaking with the correct person using two identifiers. Location patient: home Location provider: [facility]- Persons participating in the virtual visit: patient, physician   I discussed the limitations, risks, security and privacy concerns of performing an evaluation and management service by video and the availability of in person appointments. The patient expressed  understanding and agreed to proceed.  Outpatient Encounter Medications as of 04/03/2019  Medication Sig   Cranberry-Vitamin C (CRANBERRY CONCENTRATE/VITAMINC PO) Take 4,200 mg by mouth daily.   [EXPIRED] fluconazole  (DIFLUCAN ) 100 MG tablet Take 1 tablet (100 mg total) by mouth daily for 7 days.   Probiotic Product (SOLUBLE FIBER/PROBIOTICS PO) Take 1 capsule by mouth daily.   vitamin B-12 (CYANOCOBALAMIN ) 1000 MCG tablet Take 1,000 mcg by mouth daily.   [DISCONTINUED] albuterol  (PROVENTIL  HFA;VENTOLIN  HFA) 108 (90 Base) MCG/ACT inhaler Inhale 1-2 puffs into the lungs every 4 (four) hours as needed for wheezing or shortness of breath.   [DISCONTINUED] Biotin 5000 MCG CAPS Take 1 capsule by mouth. Daily   [DISCONTINUED] calcium -vitamin D  (OSCAL WITH D) 500-200 MG-UNIT tablet Take 1 tablet by mouth 2 (two) times daily.   [DISCONTINUED] cetirizine (ZYRTEC) 10 MG tablet Take 10 mg by mouth daily.   [DISCONTINUED] Cholecalciferol (VITAMIN D3) 1000 UNITS CAPS Take by mouth.   [DISCONTINUED] diazepam  (VALIUM ) 5 MG tablet Take 5 mg by mouth every 8 (eight) hours as needed.   [DISCONTINUED] doxycycline  (VIBRAMYCIN ) 100 MG capsule Take 100 mg by mouth 2 (two) times daily.   [DISCONTINUED] doxycycline  (VIBRAMYCIN ) 100 MG capsule Take 1 capsule (100 mg total) by mouth 2 (two) times daily.   [DISCONTINUED] FLOVENT  HFA 110 MCG/ACT inhaler TAKE 2 PUFFS BY MOUTH TWICE A DAY   [DISCONTINUED] fluticasone  furoate-vilanterol (BREO ELLIPTA ) 200-25 MCG/INH AEPB Inhale 1 puff into the lungs daily.   [DISCONTINUED] GLUCOSAMINE-CHONDROITIN PO Take 1 tablet by mouth daily.   [DISCONTINUED] Magnesium 400 MG  CAPS Take 1 capsule by mouth daily.   [DISCONTINUED] Multiple Vitamin (MULTIVITAMIN) tablet Take 1 tablet by mouth daily.   [DISCONTINUED] omega-3 fish oil (MAXEPA) 1000 MG CAPS capsule Take 1 capsule by mouth daily.   [DISCONTINUED] PHENObarbital  (LUMINAL) 60 MG tablet TAKE 1 TABLET BY MOUTH EVERY DAY    [DISCONTINUED] predniSONE  (STERAPRED UNI-PAK 21 TAB) 10 MG (21) TBPK tablet Take as directed in package   [DISCONTINUED] Respiratory Therapy Supplies (FLUTTER) DEVI 1 each by Does not apply route 4 (four) times daily.   [DISCONTINUED] rizatriptan  (MAXALT ) 10 MG tablet TAKE 1 TABLET BY MOUTH AS NEEDED FOR MIGRAINE. MAY REPEAT IN 2 HOURS IF NEEDED   [DISCONTINUED] zolpidem  (AMBIEN ) 10 MG tablet Take 1 tablet (10 mg total) by mouth at bedtime as needed for sleep.   No facility-administered encounter medications on file as of 04/03/2019.      Objective:   There were no vitals filed for this visit.   Physical exam documentation is limited by delayed entry of information.

## 2019-04-03 NOTE — Telephone Encounter (Signed)
In office visit has been switched to phone visit at 1:30. Pt is agreeable with this plan. Nothing further is needed.

## 2019-04-03 NOTE — Telephone Encounter (Signed)
Spoke to [place] with [facility], who states there is a medication reaction with Diflucan and Luminal.   Dr. Patsey Berthold please advise. Thanks

## 2019-04-03 NOTE — Patient Instructions (Signed)
1.  Sending doxycycline to the pharmacy of 100 mg twice a day for 10 days  2.  Diflucan is been sent to your pharmacy  3.  Samples of Breo 200/25 1 inhalation daily for 14 days let us know how that does for you  4.  We will see you in follow-up in 4 to 6 weeks time.  Should be an in person visit.

## 2019-04-04 ENCOUNTER — Other Ambulatory Visit: Payer: Self-pay | Admitting: Pulmonary Disease

## 2019-04-04 NOTE — Telephone Encounter (Signed)
Bernard with [facility] is aware of below message and voiced his understanding.  Nothing further is needed.

## 2019-04-04 NOTE — Telephone Encounter (Signed)
She has tolerated this combination previously.  It is only flagged for a POTENTIAL reaction related to decreased Diflucan levels.  Okay to prescribe.

## 2019-05-01 ENCOUNTER — Telehealth: Payer: Self-pay | Admitting: Pulmonary Disease

## 2019-05-01 ENCOUNTER — Other Ambulatory Visit: Payer: Self-pay

## 2019-05-01 MED ORDER — BREO ELLIPTA 200-25 MCG/INH IN AEPB: 1.0000 | INHALATION_SPRAY | Freq: Every day | RESPIRATORY_TRACT | 1 refills | Status: AC

## 2019-05-01 MED ORDER — BREO ELLIPTA 200-25 MCG/INH IN AEPB: 1.0000 | INHALATION_SPRAY | Freq: Every day | RESPIRATORY_TRACT | 1 refills | Status: DC

## 2019-05-01 NOTE — Telephone Encounter (Signed)
Lets keep her on Breo 200/25, 1 inhalation daily.  Rinse mouth well after use.

## 2019-05-01 NOTE — Telephone Encounter (Signed)
Pt had to r/s her appt on 05/03/19 w/ DG because she is getting her covid vaccine on the same day and wasn't sure if she would be able to make it to her appt. The next available was 05/29/19. And her only concern is that DG gave her 2 samples of Breo Ellipta to try and they were going to discuss prescribing the medication and discussing the price of it. Or if she needs to go back on Flovent. But she would be out of the [facility] before her appt. Cb#: [phone]

## 2019-05-01 NOTE — Telephone Encounter (Signed)
Rx for Breo 200 has been sent to preferred pharmacy. Pt is aware and voiced her understanding. Nothing further is needed.  

## 2019-05-01 NOTE — Telephone Encounter (Signed)
Pt rescheduled 05/03/2019 office visit until 05/29/2019 due to having covid vaccine that day. Pt was given samples of Breo and was planning to discuss staying on breo and cost with Dr. Patsey Berthold during upcoming appt. Pt will run out of Breo prior 05/29/2019. Pt is questioning if she should stay on Breo or go back to Flovent until her upcoming appt. If pt should remain on Breo, she will need a Rx.   LG, please advise. Thanks

## 2019-05-03 ENCOUNTER — Ambulatory Visit: Payer: Medicare Other | Admitting: Pulmonary Disease

## 2019-05-03 ENCOUNTER — Ambulatory Visit: Payer: Medicare Other | Attending: Internal Medicine

## 2019-05-03 DIAGNOSIS — Z23 Encounter for immunization: Secondary | ICD-10-CM | POA: Insufficient documentation

## 2019-05-03 NOTE — Progress Notes (Signed)
   Covid-19 Vaccination Clinic  Name:  ZARIANNA FROEHLICH    MRN: [id number] DOB: [birth year]  05/03/2019  Ms. Langager was observed post Covid-19 immunization for 15 minutes without incidence. She was provided with Vaccine Information Sheet and instruction to access the V-Safe system.   Ms. Warhurst was instructed to call 911 with any severe reactions post vaccine: Marland Kitchen Difficulty breathing  . Swelling of your face and throat  . A fast heartbeat  . A bad rash all over your body  . Dizziness and weakness    Immunizations Administered    Name Date Dose VIS Date Route   Pfizer COVID-19 Vaccine 05/03/2019  9:50 AM 0.3 mL 03/01/2019 Intramuscular   Manufacturer: [place]   Lot: [id number]   [place]: [id number]

## 2019-05-28 ENCOUNTER — Ambulatory Visit: Payer: Medicare Other | Attending: Internal Medicine

## 2019-05-28 DIAGNOSIS — Z23 Encounter for immunization: Secondary | ICD-10-CM | POA: Insufficient documentation

## 2019-05-28 NOTE — Progress Notes (Signed)
   Covid-19 Vaccination Clinic  Name:  Barbara Odom    MRN: [id number] DOB: 23-Sep-1950  05/28/2019  [name] was observed post Covid-19 immunization for 15 minutes without incident. She was provided with Vaccine Information Sheet and instruction to access the V-Safe system.   [name] was instructed to call 911 with any severe reactions post vaccine: Marland Kitchen Difficulty breathing  . Swelling of face and throat  . A fast heartbeat  . A bad rash all over body  . Dizziness and weakness   Immunizations Administered    Name Date Dose VIS Date Route   Pfizer COVID-19 Vaccine 05/28/2019 10:46 AM 0.3 mL 03/01/2019 Intramuscular   Manufacturer: [place]   Lot: [id number]   [place]: [id number]

## 2019-05-29 ENCOUNTER — Ambulatory Visit: Payer: Medicare Other | Admitting: Pulmonary Disease

## 2019-05-30 ENCOUNTER — Encounter: Payer: Medicare Other | Admitting: Obstetrics & Gynecology

## 2019-06-06 DIAGNOSIS — M5136 Other intervertebral disc degeneration, lumbar region: Secondary | ICD-10-CM | POA: Diagnosis not present

## 2019-06-11 ENCOUNTER — Telehealth: Payer: Self-pay | Admitting: Internal Medicine

## 2019-06-11 DIAGNOSIS — R300 Dysuria: Secondary | ICD-10-CM

## 2019-06-11 NOTE — Telephone Encounter (Signed)
Future lab orders for urine have been placed.

## 2019-06-11 NOTE — Telephone Encounter (Signed)
Pt thinks she might be getting a UTI and she said she can drop off a urine if needed. She also wants a callback to talk about getting on Celebrex for pain. She said if she needs to schedule an appt she is willing to do that. Please advise.

## 2019-06-11 NOTE — Telephone Encounter (Signed)
LMTCB

## 2019-06-11 NOTE — Telephone Encounter (Signed)
Pt has been scheduled to see Dawson Bills, NP virtually on 3/25. Will be coming in to give urine sample tomorrow.

## 2019-06-11 NOTE — Telephone Encounter (Signed)
Pt returned your call.  

## 2019-06-12 ENCOUNTER — Other Ambulatory Visit: Payer: Medicare Other

## 2019-06-12 ENCOUNTER — Other Ambulatory Visit: Payer: Self-pay

## 2019-06-12 DIAGNOSIS — Z79899 Other long term (current) drug therapy: Secondary | ICD-10-CM

## 2019-06-12 DIAGNOSIS — R300 Dysuria: Secondary | ICD-10-CM | POA: Diagnosis not present

## 2019-06-12 NOTE — Addendum Note (Signed)
Addended by: Tor Netters I on: 06/12/2019 02:28 PM   Modules accepted: Orders

## 2019-06-13 ENCOUNTER — Other Ambulatory Visit: Payer: Self-pay

## 2019-06-13 ENCOUNTER — Telehealth: Payer: Medicare Other | Admitting: Nurse Practitioner

## 2019-06-13 ENCOUNTER — Encounter: Payer: Self-pay | Admitting: Nurse Practitioner

## 2019-06-13 VITALS — Ht 66.0 in | Wt 134.0 lb

## 2019-06-13 DIAGNOSIS — R3129 Other microscopic hematuria: Secondary | ICD-10-CM | POA: Diagnosis not present

## 2019-06-13 DIAGNOSIS — R82998 Other abnormal findings in urine: Secondary | ICD-10-CM | POA: Diagnosis not present

## 2019-06-13 HISTORY — DX: Other abnormal findings in urine: R82.998

## 2019-06-13 LAB — URINALYSIS, ROUTINE W REFLEX MICROSCOPIC
Bilirubin Urine: NEGATIVE
Hgb urine dipstick: NEGATIVE
Ketones, ur: NEGATIVE
Leukocytes,Ua: NEGATIVE
Nitrite: NEGATIVE
Specific Gravity, Urine: 1.02 (ref 1.000–1.030)
Total Protein, Urine: NEGATIVE
Urine Glucose: NEGATIVE
Urobilinogen, UA: 0.2 (ref 0.0–1.0)
pH: 5.5 (ref 5.0–8.0)

## 2019-06-13 LAB — URINE CULTURE
MICRO NUMBER:: 10287566
SPECIMEN QUALITY:: ADEQUATE

## 2019-06-13 NOTE — Progress Notes (Signed)
Virtual Visit via Video Note  I connected with@ on 06/13/19 at  4:00 PM EDT by a video enabled telemedicine application and verified that I am speaking with the correct person using two identifiers. This visit type was conducted due to national recommendations for restrictions regarding the COVID-19 Pandemic (e.g. social distancing).  This format is felt to be most appropriate for this patient at this time.   I discussed the limitations of evaluation and management by telemedicine and the availability of in person appointments. The patient expressed understanding and agreed to proceed.  Only the patient and myself were on today's video visit. The patient was at home and I was in my office at the time of today's visit.   History of Present Illness:  Hx of mild hematuria and has seen a Urologist several times over the years. She has noted a  strong odor of her urine and has been scoped for mild hematuria. She has experienced mild supra pubic discomfort. She has a PMH of interstitial cystitis. She has wondered about a UTI over  the weekend. Saw GYN in April and was told her odor is not in the vagina. Currently, strong odor, no burning but some urgency, some supra pubic discomfort very mild, no flank pain, hematuria or fever/shills. She feels well.   Observations/Objective: Gen: Awake, alert, no acute distress Resp: Breathing is even and non-labored Psych: calm/pleasant demeanor Neuro: Alert and Oriented x 3, + facial symmetry, speech is clear.  Assessment and Plan: Urinary odor UA with hematuria UA with oxalate crystals- she is a heavy greens consumer which is high in oxalates.   I have placed a referral to Dr. Hollice Espy in [place] for you. Let me know if you do not get a call for an appt by next Friday.   As we discussed, hydrate well, avoid high oxalate foods for now.  Hold on your calcium and Vit D until you get the opinion of Urology. We are considering kidney stones. Once that is  evaluated, you will need to re-start  when Urology says all clear since they are helpful for your bones.  If you develop abdominal or back pain, fever, blood in urine proceed  to the Emergency Department for evaluation.   Follow up with Dr. Derrel Nip if symptoms persist or worsen.  Follow Up Instructions:    I discussed the assessment and treatment plan with the patient. The patient was provided an opportunity to ask questions and all were answered. The patient agreed with the plan and demonstrated an understanding of the instructions.   The patient was advised to call back or seek an in-person evaluation if the symptoms worsen or if the condition fails to improve as anticipated.    Denice Paradise, NP

## 2019-06-14 NOTE — Patient Instructions (Addendum)
It was very nice to meet you through the video.  I have placed a referral to Dr. Hollice Espy in [place] for you. Let me know if you do not get a call for an appt by next Friday.   As we discussed, hydrate well, avoid high oxalate foods for now.  Hold on your calcium and Vit D until you get the opinion of Urology. We are considering kidney stones. Once that is evaluated, you will need to re-start  when Urology says all clear since they are helpful for your bones.  If you develop abdominal or back pain, fever, blood in urine proceed  to the Emergency Department for evaluation.   Follow up with Dr. Derrel Nip if symptoms persist or worsen.

## 2019-07-09 ENCOUNTER — Encounter: Payer: Self-pay | Admitting: Internal Medicine

## 2019-07-09 ENCOUNTER — Telehealth (INDEPENDENT_AMBULATORY_CARE_PROVIDER_SITE_OTHER): Payer: Medicare Other | Admitting: Internal Medicine

## 2019-07-09 VITALS — Ht 66.0 in | Wt 134.0 lb

## 2019-07-09 DIAGNOSIS — J321 Chronic frontal sinusitis: Secondary | ICD-10-CM | POA: Diagnosis not present

## 2019-07-09 MED ORDER — PREDNISONE 20 MG PO TABS: 40.0000 mg | ORAL_TABLET | Freq: Every day | ORAL | 0 refills | Status: DC

## 2019-07-09 MED ORDER — DOXYCYCLINE HYCLATE 100 MG PO TABS: 100.0000 mg | ORAL_TABLET | Freq: Two times a day (BID) | ORAL | 0 refills | Status: DC

## 2019-07-09 NOTE — Progress Notes (Signed)
Virtual Visit via Video Note  I connected with Barbara Odom  on 07/09/19 at  1:50 PM EDT by a video enabled telemedicine application and verified that I am speaking with the correct person using two identifiers.  Location patient: home Location provider:work or home office Persons participating in the virtual visit: patient, provider  I discussed the limitations of evaluation and management by telemedicine and the availability of in person appointments. The patient expressed understanding and agreed to proceed.   HPI: 1. H/o sinus infection x 1x per years w/in the last 1 week had frontal and maxillary sinus pain. 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 Right ear feels like she cant hear and she has h/a. She has tried moist heat to right ear w/o relief and pain rad to right jawline  No cough, no drainage she does feel lightheaded, denies fever   She is flying next to see daughter who is pregnant   ROS: See pertinent positives and negatives per HPI.  Past Medical History:  Diagnosis Date  . 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 History of diverticulitis of colon   . Migraine headache   . Osteopenia   . Pneumonia    multi-lobe   . seasonal rhinitis     Past Surgical History:  Procedure Laterality Date  . BREAST BIOPSY Right 2005   Negative- clip placed  . BREAST EXCISIONAL BIOPSY Left 1997   Negative --- Pt states this procedure was a lumpect.  Marland Kitchen BREAST SURGERY     lumpectomy, benign   . CARPAL TUNNEL RELEASE  1983   bilateral   . CERVICAL DISCECTOMY  August 2012   [place]    Family History  Problem Relation Age of Onset  . Heart disease Mother        Atrial fibrillation  . Coronary artery disease Mother   . Cancer Maternal Grandmother        colon Ca   . Cancer Maternal Grandfather        Colon CA  . Macular degeneration Father   . Congestive Heart Failure Father     SOCIAL HX: lives at home   Current Outpatient Medications:  .  albuterol (PROVENTIL HFA;VENTOLIN HFA) 108 (90 Base) MCG/ACT inhaler, Inhale 1-2 puffs into the lungs every 4 (four) hours as needed for wheezing or shortness of breath., Disp: 1 Inhaler, Rfl: 10 .  Biotin 5000 MCG CAPS, Take 1 capsule by mouth. Daily, Disp: , Rfl:  .  calcium-vitamin D (OSCAL WITH D) 500-200 MG-UNIT tablet, Take 1 tablet by mouth 2 (two) times daily., Disp: , Rfl:  .  cetirizine (ZYRTEC) 10 MG tablet, Take 10 mg by mouth daily., Disp: , Rfl:  .  Cholecalciferol (VITAMIN D3) 1000 UNITS CAPS, Take by mouth., Disp: , Rfl:  .  Cranberry-Vitamin C (CRANBERRY CONCENTRATE/VITAMINC PO), Take 4,200 mg by mouth daily., Disp: , Rfl:  .  diazepam (VALIUM) 5 MG tablet, Take 5 mg by mouth every 8 (eight) hours as needed., Disp: , Rfl:  .  Fexofenadine HCl (ALLEGRA ALLERGY PO), Take by mouth., Disp: , Rfl:  .  fluticasone furoate-vilanterol (BREO ELLIPTA) 200-25 MCG/INH AEPB, Inhale 1 puff into the lungs daily., Disp: 1 each, Rfl: 0 .  GLUCOSAMINE-CHONDROITIN PO, Take 1 tablet by mouth  daily., Disp: , Rfl:  .  Magnesium 400 MG CAPS, Take 1 capsule by mouth daily., Disp: , Rfl:  .  Multiple Vitamin (MULTIVITAMIN) tablet, Take 1 tablet by mouth daily.  , Disp: , Rfl:  .  omega-3 fish oil (MAXEPA) 1000 MG CAPS capsule, Take 1 capsule by mouth daily., Disp: , Rfl:  .  PHENObarbital (LUMINAL) 60 MG tablet, TAKE 1 TABLET BY MOUTH EVERY DAY, Disp: 90 tablet, Rfl: 3 .  Probiotic Product (SOLUBLE FIBER/PROBIOTICS PO), Take 1 capsule by mouth daily., Disp: , Rfl:  .  rizatriptan (MAXALT) 10 MG tablet, TAKE 1 TABLET BY MOUTH AS NEEDED FOR MIGRAINE. MAY REPEAT IN 2 HOURS IF NEEDED, Disp: 18 tablet, Rfl: 6 .  vitamin B-12 (CYANOCOBALAMIN) 1000 MCG tablet, Take 1,000 mcg by mouth daily., Disp: , Rfl:  .  zolpidem (AMBIEN)  10 MG tablet, Take 1 tablet (10 mg total) by mouth at bedtime as needed for sleep., Disp: 30 tablet, Rfl: 5 .  doxycycline (VIBRA-TABS) 100 MG tablet, Take 1 tablet (100 mg total) by mouth 2 (two) times daily. X 7-10 days, Disp: 20 tablet, Rfl: 0 .  predniSONE (DELTASONE) 20 MG tablet, Take 2 tablets (40 mg total) by mouth daily with breakfast. X 5-7, Disp: 14 tablet, Rfl: 0  EXAM:  VITALS per patient if applicable:  GENERAL: alert, oriented, appears well and in no acute distress  HEENT: atraumatic, conjunttiva clear, no obvious abnormalities on inspection of external nose and ears  NECK: normal movements of the head and neck  LUNGS: on inspection no signs of respiratory distress, breathing rate appears normal, no obvious gross SOB, gasping or wheezing  CV: no obvious cyanosis  MS: moves all visible extremities without noticeable abnormality  PSYCH/NEURO: pleasant and cooperative, no obvious depression or anxiety, speech and thought processing grossly intact  ASSESSMENT AND PLAN:  Discussed the following assessment and plan:  Frontal sinusitis, unspecified chronicity - Plan: predniSONE (DELTASONE) 40 MG table x 5 dayst, doxycycline (VIBRA-TABS) 100 MG tablet bid x 7-10 days Has prn diflucan  NS, flonase  Decongestants 3 days or less  Zinc 100 mg qd  Vitamin C 1000 mg qd  Tea with honey and lemon Elderberry Oil of oregano Phone back if not better in 2-3 days  -we discussed possible serious and likely etiologies, options for evaluation and workup, limitations of telemedicine visit vs in person visit, treatment, treatment risks and precautions. Pt prefers to treat via telemedicine empirically rather then risking or undertaking an in person visit at this moment. Patient agrees to seek prompt in person care if worsening, new symptoms arise, or if is not improving with treatment.   I discussed the assessment and treatment plan with the patient. The patient was provided an opportunity  to ask questions and all were answered. The patient agreed with the plan and demonstrated an understanding of the instructions.   The patient was advised to call back or seek an in-person evaluation if the symptoms worsen or if the condition fails to improve as anticipated.   Nino Glow McLean-Scocuzza, MD

## 2019-07-12 ENCOUNTER — Other Ambulatory Visit: Payer: Self-pay

## 2019-07-15 ENCOUNTER — Other Ambulatory Visit: Payer: Self-pay

## 2019-07-15 ENCOUNTER — Encounter: Payer: Medicare Other | Admitting: Obstetrics & Gynecology

## 2019-07-15 ENCOUNTER — Ambulatory Visit: Payer: Medicare Other | Admitting: Nurse Practitioner

## 2019-07-15 ENCOUNTER — Encounter: Payer: Self-pay | Admitting: Nurse Practitioner

## 2019-07-15 VITALS — BP 130/80 | HR 80 | Temp 97.0°F | Ht 66.0 in | Wt 137.2 lb

## 2019-07-15 DIAGNOSIS — J019 Acute sinusitis, unspecified: Secondary | ICD-10-CM | POA: Diagnosis not present

## 2019-07-15 DIAGNOSIS — Z01118 Encounter for examination of ears and hearing with other abnormal findings: Secondary | ICD-10-CM

## 2019-07-15 DIAGNOSIS — H6121 Impacted cerumen, right ear: Secondary | ICD-10-CM | POA: Diagnosis not present

## 2019-07-15 DIAGNOSIS — H6981 Other specified disorders of Eustachian tube, right ear: Secondary | ICD-10-CM | POA: Diagnosis not present

## 2019-07-15 MED ORDER — LEVOFLOXACIN 500 MG PO TABS: 500.0000 mg | ORAL_TABLET | Freq: Every day | ORAL | 0 refills | Status: DC

## 2019-07-15 MED ORDER — FLUCONAZOLE 150 MG PO TABS: 150.0000 mg | ORAL_TABLET | Freq: Once | ORAL | 0 refills | Status: AC

## 2019-07-15 NOTE — Progress Notes (Signed)
Established Patient Office Visit  Subjective:  Patient ID: Barbara Odom, female    DOB: 1951/03/17  Age: 69 y.o. MRN: [id number]  CC:  Chief Complaint  Patient presents with  . Acute Visit    right ear check    HPI Barbara Odom presents for a follow-up of right frontal sinusitis treated during a  virtual visit on 07/09/2019.  Patient reported a lot of sinus pressure and pain on the right maxillary area, mild on the left. She had discomfort- not pain in the right ear and it was stopped up.   Barbara Odom has been taking doxycycline 100 mg twice daily, prednisone 40 mg daily for 5 days. She used Afrin for 3  days this weekend. Taking Sudafed every 4 hours. No help with the right ear pressure. She acknowledges that she is feeling about 60% better, but still has right sinus pressure, and ear pressure with decreased hearing in her right ear.  She tried Valsalva maneuvers and gets no reaction on the right side. Hx of perforated ear drum in the past with flying-so she is concerned about this. She is getting on a plane on Wed and was hoping to be feeling better by now.   She also has diarrhea from the doxycycline - a known SE. " I get diarrhea every time I take this. " Described as daytime diarrhea up to 10 times per day with gas. She is certain this is not C diff and does not want to test for it.  She is asking to be switched to Levaquin.  Patient is taking a probiotic.   She is also getting oral thrush and a vaginal yeast symptoms.    Past Medical History:  Diagnosis Date  . Bronchiectasis ([place])   . 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 History of diverticulitis of colon   . MAI (mycobacterium avium-intracellulare) ([place])   . Migraine headache   . Osteopenia   . Pneumonia    multi-lobe   . seasonal rhinitis     Past Surgical History:  Procedure Laterality Date  . BREAST BIOPSY Right 2005   Negative- clip placed   . BREAST EXCISIONAL BIOPSY Left 1997   Negative --- Pt states this procedure was a lumpect.  Marland Kitchen BREAST SURGERY     lumpectomy, benign   . CARPAL TUNNEL RELEASE  1983   bilateral   . CERVICAL DISCECTOMY  August 2012   [place]    Family History  Problem Relation Age of Onset  . Heart disease Mother        Atrial fibrillation  . Coronary artery disease Mother   . Cancer Maternal Grandmother        colon Ca  . Cancer Maternal Grandfather        Colon CA  . Macular degeneration Father   . Congestive Heart Failure Father     Social History   Socioeconomic History  . Marital status: Married    Spouse name: Not on file  . Number of children: Not on file  . Years of education: Not on file  . Highest education level: Not on file  Occupational History  . Occupation: BEHAVIOR MED    Employer: [employer]  Tobacco Use  . Smoking status: Never Smoker  . Smokeless tobacco: Never Used  Substance and Sexual Activity  . Alcohol use: Yes    Comment: socially   .  Drug use: No  . Sexual activity: Yes    Partners: Male    Comment: 1st intercourse- 31, partners- 84, married- 2 yrs   Other Topics Concern  . Not on file  Social History Narrative  . Not on file   Social Determinants of Health   Financial Resource Strain: Low Risk   . Difficulty of Paying Living Expenses: Not hard at all  Food Insecurity: No Food Insecurity  . Worried About Charity fundraiser in the Last Year: Never true  . Ran Out of Food in the Last Year: Never true  Transportation Needs: No Transportation Needs  . Lack of Transportation (Medical): No  . Lack of Transportation (Non-Medical): No  Physical Activity: Sufficiently Active  . Days of Exercise per Week: 4 days  . Minutes of Exercise per Session: 60 min  Stress: No Stress Concern Present  . Feeling of Stress : Not at all  Social Connections:   . Frequency of Communication with Friends and Family:   . Frequency of Social Gatherings  with Friends and Family:   . Attends Religious Services:   . Active Member of Clubs or Organizations:   . Attends Archivist Meetings:   Marland Kitchen Marital Status:   Intimate Partner Violence: Not At Risk  . Fear of Current or Ex-Partner: No  . Emotionally Abused: No  . Physically Abused: No  . Sexually Abused: No    Outpatient Medications Prior to Visit  Medication Sig Dispense Refill  . albuterol (PROVENTIL HFA;VENTOLIN HFA) 108 (90 Base) MCG/ACT inhaler Inhale 1-2 puffs into the lungs every 4 (four) hours as needed for wheezing or shortness of breath. 1 Inhaler 10  . Ascorbic Acid (VITAMIN C) 1000 MG tablet Take 1,000 mg by mouth daily.    . Biotin 5000 MCG CAPS Take 1 capsule by mouth. Daily    . calcium-vitamin D (OSCAL WITH D) 500-200 MG-UNIT tablet Take 1 tablet by mouth 2 (two) times daily.    . cetirizine (ZYRTEC) 10 MG tablet Take 10 mg by mouth daily.    . Cholecalciferol (VITAMIN D3) 1000 UNITS CAPS Take by mouth.    . Cranberry-Vitamin C (CRANBERRY CONCENTRATE/VITAMINC PO) Take 4,200 mg by mouth daily.    . diazepam (VALIUM) 5 MG tablet Take 5 mg by mouth every 8 (eight) hours as needed.    Marland Kitchen Fexofenadine HCl (ALLEGRA ALLERGY PO) Take by mouth.    . fluticasone furoate-vilanterol (BREO ELLIPTA) 200-25 MCG/INH AEPB Inhale 1 puff into the lungs daily. 1 each 0  . GLUCOSAMINE-CHONDROITIN PO Take 1 tablet by mouth daily.    . Magnesium 400 MG CAPS Take 1 capsule by mouth daily.    . Multiple Vitamin (MULTIVITAMIN) tablet Take 1 tablet by mouth daily.      Marland Kitchen omega-3 fish oil (MAXEPA) 1000 MG CAPS capsule Take 1 capsule by mouth daily.    Marland Kitchen PHENObarbital (LUMINAL) 60 MG tablet TAKE 1 TABLET BY MOUTH EVERY DAY 90 tablet 3  . predniSONE (DELTASONE) 20 MG tablet Take 2 tablets (40 mg total) by mouth daily with breakfast. X 5-7 14 tablet 0  . Probiotic Product (SOLUBLE FIBER/PROBIOTICS PO) Take 1 capsule by mouth daily.    . rizatriptan (MAXALT) 10 MG tablet TAKE 1 TABLET BY MOUTH  AS NEEDED FOR MIGRAINE. MAY REPEAT IN 2 HOURS IF NEEDED 18 tablet 6  . vitamin B-12 (CYANOCOBALAMIN) 1000 MCG tablet Take 1,000 mcg by mouth daily.    Marland Kitchen zolpidem (AMBIEN) 10 MG tablet Take  1 tablet (10 mg total) by mouth at bedtime as needed for sleep. 30 tablet 5  . doxycycline (VIBRA-TABS) 100 MG tablet Take 1 tablet (100 mg total) by mouth 2 (two) times daily. X 7-10 days 20 tablet 0   No facility-administered medications prior to visit.    Allergies  Allergen Reactions  . Penicillins Other (See Comments)    Reaction occurred as a child; "mom described something that sounds like bronchospasms"    Review of Systems  Constitutional: Negative for chills and fever.  Respiratory: Negative.   Cardiovascular: Negative.   Musculoskeletal: Negative.   Skin: Negative.   Allergic/Immunologic: Positive for environmental allergies.  Psychiatric/Behavioral:       Stressed over the health of her DTR whom she is visiting.       Objective:    Physical Exam  Constitutional: She is oriented to person, place, and time. She appears well-developed and well-nourished.  HENT:  Head: Normocephalic and atraumatic.  Right Ear: Tympanic membrane is scarred. No decreased hearing (Feels like under water) is noted.  Left Ear: Hearing, tympanic membrane, external ear and ear canal normal.  Cardiovascular: Normal rate, regular rhythm and normal heart sounds.  Pulmonary/Chest: Effort normal and breath sounds normal.  Abdominal: Soft. There is no abdominal tenderness.  Musculoskeletal:        General: Normal range of motion.     Cervical back: Normal range of motion and neck supple.  Neurological: She is alert and oriented to person, place, and time.  Skin: Skin is warm and dry.  Psychiatric: She has a normal mood and affect. Her behavior is normal.    BP 130/80 (BP Location: Left Arm, Patient Position: Sitting, Cuff Size: Small)   Pulse 80   Temp (!) 97 F (36.1 C) (Skin)   Ht 5\' 6"  (1.676 m)   Wt  137 lb 3.2 oz (62.2 kg)   SpO2 99%   BMI 22.14 kg/m  Wt Readings from Last 3 Encounters:  07/15/19 137 lb 3.2 oz (62.2 kg)  07/09/19 134 lb (60.8 kg)  06/13/19 134 lb (60.8 kg)     There are no preventive care reminders to display for this patient.  There are no preventive care reminders to display for this patient.  Lab Results  Component Value Date   TSH 3.83 02/26/2019   Lab Results  Component Value Date   WBC 11.6 (H) 02/26/2019   HGB 13.3 02/26/2019   HCT 40.4 02/26/2019   MCV 95.1 02/26/2019   PLT 306.0 02/26/2019   Lab Results  Component Value Date   NA 137 02/26/2019   K 3.6 02/26/2019   CO2 31 02/26/2019   GLUCOSE 93 02/26/2019   BUN 19 02/26/2019   CREATININE 0.80 02/26/2019   BILITOT 0.6 02/26/2019   ALKPHOS 60 02/26/2019   AST 17 02/26/2019   ALT 12 02/26/2019   PROT 7.2 02/26/2019   ALBUMIN 4.2 02/26/2019   CALCIUM 9.9 02/26/2019   ANIONGAP 6 10/08/2016   GFR 71.67 02/26/2019   Lab Results  Component Value Date   CHOL 252 (H) 02/26/2019   Lab Results  Component Value Date   HDL 88.80 02/26/2019   Lab Results  Component Value Date   LDLCALC 151 (H) 02/26/2019   Lab Results  Component Value Date   TRIG 61.0 02/26/2019   Lab Results  Component Value Date   CHOLHDL 3 02/26/2019   No results found for: HGBA1C    Assessment & Plan:   Problem List Items  Addressed This Visit    None    Visit Diagnoses    Acute sinusitis, recurrence not specified, unspecified location    -  Primary   Relevant Medications   levofloxacin (LEVAQUIN) 500 MG tablet   fluconazole (DIFLUCAN) 150 MG tablet   Abnormal otoscopic exam of right ear          Meds ordered this encounter  Medications  . levofloxacin (LEVAQUIN) 500 MG tablet    Sig: Take 1 tablet (500 mg total) by mouth daily.    Dispense:  5 tablet    Refill:  0    Order Specific Question:   Supervising Provider    Answer:   Einar Pheasant [id number]  . fluconazole (DIFLUCAN) 150 MG tablet     Sig: Take 1 tablet (150 mg total) by mouth once for 1 dose.    Dispense:  1 tablet    Refill:  0    Order Specific Question:   Supervising Provider    Answer:   Einar Pheasant [id number]   Please ENT today as planned. Dr. Tami Ribas is on call and he will see you.  I have already canceled your DOXY because of diarrhea   I have ordered Levaquin and Diflucan- hold until Dr. Tami Ribas evaluates as he may make a different recommendation. Probiotics Flora Stor are helpful.   Low threshold for C diff testing. Let me know if you want to send off a stool sample before your flight on Wed.   Follow-up: Return if symptoms worsen or fail to improve.    Denice Paradise, NP

## 2019-07-15 NOTE — Patient Instructions (Addendum)
Please ENT today as planned. Dr. Tami Ribas is on call and he will see you.  I have already canceled your DOXY because of diarrhea   I have ordered Levaquin and Diflucan- hold until Dr. Tami Ribas evaluates as he may make a different recommendation. Probiotics Flora Stor are helpful.   Low threshold for C diff testing. Let me know if you want to send off a stool sample before your flight on Wed.

## 2019-07-15 NOTE — Progress Notes (Signed)
07/16/19 9:09 AM   Danie Binder 07-Jun-1950 [id number]  Referring provider: Crecencio Mc, MD [place],  [place] [address] Chief Complaint  Patient presents with  . calcium crystals in urine    HPI: Barbara Odom is a 69 y.o. F w/ hx of hematuria and IC (remote dx) returns today for the evaluation and management UTI.   She presented for an E-visit w/ PCP on 06/13/19 c/o strong odor, urgency and suprapubic discomfort. UA on 06/12/19 revealed calcium oxalate crystals, otherwise negative. Her UA from 4 months ago revealed 3-6 RBC/hpf. Associated urine culture negative.   She reports of frequency, urgency and odor. She was taking calcium, Vit D and fish oil however discontinued by PCP due to calcium oxalate crystals seen in her urine. She eats a salad w/ q meal. She denies burning w/ urination. She has been taking cranberry tablets.   She denies flank pain, gross hematuria or any hx of kidney stones.   She was infomred have IC in the 90s when she was working at [employer]. She had silver nitrate put into her bladder twice. She was having normal symptoms of frequency and bladder spasms which was pretty common for her during that time.   She had a cysto done 5-6 years ago by Dr. Jeffie Pollock for hx of microscopic blood. She was told she will typically have hematuria throughout her life.   She smoked a few times in high school. 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   PMH: Past Medical History:  Diagnosis Date  . Bronchiectasis ([place])   . 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 History of diverticulitis of colon   . MAI (mycobacterium avium-intracellulare) ([place])   . Migraine headache   . Osteopenia   . Pneumonia    multi-lobe   . seasonal rhinitis     Surgical History: Past Surgical History:  Procedure Laterality Date  . BREAST BIOPSY Right 2005   Negative- clip placed  . BREAST EXCISIONAL BIOPSY  Left 1997   Negative --- Pt states this procedure was a lumpect.  Marland Kitchen BREAST SURGERY     lumpectomy, benign   . CARPAL TUNNEL RELEASE  1983   bilateral   . CERVICAL DISCECTOMY  August 2012   [place]    Home Medications:  Allergies as of 07/16/2019      Reactions   Penicillins Other (See Comments)   Reaction occurred as a child; "mom described something that sounds like bronchospasms"      Medication List       Accurate as of July 16, 2019 11:59 PM. If you have any questions, ask your nurse or doctor.        STOP taking these medications   calcium-vitamin D 500-200 MG-UNIT tablet Commonly known as: OSCAL WITH D Stopped by: Hollice Espy, MD   omega-3 fish oil 1000 MG Caps capsule Commonly known as: MAXEPA Stopped by: Hollice Espy, MD   Vitamin D3 25 MCG (1000 UT) Caps Stopped by: Hollice Espy, MD     TAKE these medications   albuterol 108 (90 Base) MCG/ACT inhaler Commonly known as: VENTOLIN HFA Inhale 1-2 puffs into the lungs every 4 (four) hours as needed for wheezing or shortness of breath.   ALLEGRA ALLERGY PO Take by mouth.   Biotin 5000 MCG Caps Take 1 capsule by mouth. Daily   Breo Ellipta 200-25 MCG/INH Aepb Generic drug: fluticasone  furoate-vilanterol Inhale 1 puff into the lungs daily.   cetirizine 10 MG tablet Commonly known as: ZYRTEC Take 10 mg by mouth daily.   CRANBERRY CONCENTRATE/VITAMINC PO Take 4,200 mg by mouth daily.   diazepam 5 MG tablet Commonly known as: VALIUM Take 5 mg by mouth every 8 (eight) hours as needed.   fluconazole 150 MG tablet Commonly known as: DIFLUCAN Take 150 mg by mouth once.   GLUCOSAMINE-CHONDROITIN PO Take 1 tablet by mouth daily.   levofloxacin 500 MG tablet Commonly known as: Levaquin Take 1 tablet (500 mg total) by mouth daily.   Magnesium 400 MG Caps Take 1 capsule by mouth daily.   multivitamin tablet Take 1 tablet by mouth daily.   PHENObarbital 60 MG tablet Commonly known as: LUMINAL  TAKE 1 TABLET BY MOUTH EVERY DAY   predniSONE 20 MG tablet Commonly known as: DELTASONE Take 2 tablets (40 mg total) by mouth daily with breakfast. X 5-7   rizatriptan 10 MG tablet Commonly known as: MAXALT TAKE 1 TABLET BY MOUTH AS NEEDED FOR MIGRAINE. MAY REPEAT IN 2 HOURS IF NEEDED   SOLUBLE FIBER/PROBIOTICS PO Take 1 capsule by mouth daily.   vitamin B-12 1000 MCG tablet Commonly known as: CYANOCOBALAMIN Take 1,000 mcg by mouth daily.   vitamin C 1000 MG tablet Take 1,000 mg by mouth daily.   zolpidem 10 MG tablet Commonly known as: AMBIEN Take 1 tablet (10 mg total) by mouth at bedtime as needed for sleep.       Allergies:  Allergies  Allergen Reactions  . Penicillins Other (See Comments)    Reaction occurred as a child; "mom described something that sounds like bronchospasms"    Family History: Family History  Problem Relation Age of Onset  . Heart disease Mother        Atrial fibrillation  . Coronary artery disease Mother   . Cancer Maternal Grandmother        colon Ca  . Cancer Maternal Grandfather        Colon CA  . Macular degeneration Father   . Congestive Heart Failure Father     Social History:  reports that she has never smoked. She has never used smokeless tobacco. She reports current alcohol use. She reports that she does not use drugs.   Physical Exam: BP (!) 158/77   Pulse 76   Ht 5\' 7"  (1.702 m)   Wt 134 lb (60.8 kg)   BMI 20.99 kg/m   Constitutional:  Alert and oriented, No acute distress. HEENT: [place] AT, moist mucus membranes.  Trachea midline, no masses. Cardiovascular: No clubbing, cyanosis, or edema. Respiratory: Normal respiratory effort, no increased work of breathing. Skin: No rashes, bruises or suspicious lesions. Neurologic: Grossly intact, no focal deficits, moving all 4 extremities. Psychiatric: Normal mood and affect.  Laboratory Data:  Urinalysis  UA today shows trace blood on dip otherwise unremarkable.    Assessment & Plan:    1. Microscopic hematuria  3-6 RBC/hpf from 4 months ago noted on UA   UA on 06/12/19 revealed calcium oxalate crystals- will ass  We discussed the differential diagnosis for microscopic hematuria including nephrolithiasis, renal or upper tract tumors, bladder stones, UTIs, or bladder tumors as well as undetermined etiologies.  Technically high risk given age and hx of hematuria, however based on age which is just above cut off but no other risk factors.  As such, discussed option of RUS vs. CTU.I have reviewed the above documentation for accuracy and completeness,  and I agree with the above.   Per AUA guidelines, I did recommend complete microscopic hematuria evaluation including RUS, possible urine cytology, and office cystoscopy.  Return on calcium and Vitamin D if stones ruled out.   Return for cysto w/ RUS prior  2. Calcium oxalate crystals in urine Will assess with the [facility] [address] [place], [place] [address] [phone]  I, Lucas Mallow, am acting as a scribe for Dr. Hollice Espy,  I have reviewed the above documentation for accuracy and completeness, and I agree with the above.   Hollice Espy, MD  .Maryjean Ka

## 2019-07-16 ENCOUNTER — Ambulatory Visit (INDEPENDENT_AMBULATORY_CARE_PROVIDER_SITE_OTHER): Payer: Medicare Other | Admitting: Urology

## 2019-07-16 ENCOUNTER — Encounter: Payer: Self-pay | Admitting: Urology

## 2019-07-16 VITALS — BP 158/77 | HR 76 | Ht 67.0 in | Wt 134.0 lb

## 2019-07-16 DIAGNOSIS — R82998 Other abnormal findings in urine: Secondary | ICD-10-CM

## 2019-07-16 DIAGNOSIS — R3129 Other microscopic hematuria: Secondary | ICD-10-CM

## 2019-07-16 LAB — URINALYSIS, COMPLETE
Bilirubin, UA: NEGATIVE
Glucose, UA: NEGATIVE
Ketones, UA: NEGATIVE
Leukocytes,UA: NEGATIVE
Nitrite, UA: NEGATIVE
Protein,UA: NEGATIVE
Specific Gravity, UA: 1.01 (ref 1.005–1.030)
Urobilinogen, Ur: 0.2 mg/dL (ref 0.2–1.0)
pH, UA: 6 (ref 5.0–7.5)

## 2019-07-16 LAB — MICROSCOPIC EXAMINATION: Bacteria, UA: NONE SEEN

## 2019-07-16 NOTE — Patient Instructions (Signed)

## 2019-08-02 ENCOUNTER — Other Ambulatory Visit: Payer: Self-pay

## 2019-08-02 ENCOUNTER — Ambulatory Visit
Admission: RE | Admit: 2019-08-02 | Discharge: 2019-08-02 | Disposition: A | Discharge status: Home / Self Care | Payer: Medicare Other | Source: Ambulatory Visit | Attending: Urology | Admitting: Urology

## 2019-08-02 DIAGNOSIS — R3129 Other microscopic hematuria: Secondary | ICD-10-CM | POA: Diagnosis not present

## 2019-08-05 ENCOUNTER — Telehealth: Payer: Self-pay | Admitting: *Deleted

## 2019-08-05 NOTE — Telephone Encounter (Addendum)
Patient instructed, voiced understanding.   ----- Message from Hollice Espy, MD sent at 08/05/2019  9:15 AM EDT ----- RUS looks good.    Hollice Espy, MD

## 2019-08-22 ENCOUNTER — Other Ambulatory Visit: Payer: Self-pay | Admitting: Pulmonary Disease

## 2019-08-26 ENCOUNTER — Telehealth: Payer: Self-pay | Admitting: Pulmonary Disease

## 2019-08-26 ENCOUNTER — Other Ambulatory Visit: Payer: Self-pay | Admitting: Pulmonary Disease

## 2019-08-26 MED ORDER — BREO ELLIPTA 200-25 MCG/INH IN AEPB: 1.0000 | INHALATION_SPRAY | Freq: Every day | RESPIRATORY_TRACT | 5 refills | Status: DC

## 2019-08-26 NOTE — Telephone Encounter (Signed)
Breo 200 refilled  Made pt appt since overdue  Nothing further needed

## 2019-08-26 NOTE — Progress Notes (Signed)
° °  08/27/19   CC:  Chief Complaint  Patient presents with   Cysto    HPI: Barbara Odom is a 69 y.o. F w/ hx of hematuria and IC (remote dx) returns for cysto.   She presented for an E-visit w/ PCP on 06/13/19 c/o strong odor, urgency and suprapubic discomfort. UA on 06/12/19 revealed calcium oxalate crystals, otherwise negative. Her UA from 4 months ago revealed 3-6 RBC/hpf. Associated urine culture negative.  She was infomred have IC in the 90s when she was working at [employer]. She had silver nitrate put into her bladder twice. She was having normal symptoms of frequency and bladder spasms which was pretty common for her during that time.   She had a cysto done 5-6 years ago by Dr. Jeffie Pollock for hx of microscopic blood. She was told she will typically have hematuria throughout her life.   She smoked a few times in high school. 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   RUS from 08/02/19 unremarkable.   Please see previous note for details.   Today's Vitals   08/27/19 1321  BP: 138/87  Pulse: 78   There is no height or weight on file to calculate BMI. NED. A&Ox3.   No respiratory distress   Abd soft, NT, ND Normal external genitalia with patent urethral meatus  Cystoscopy Procedure Note  Patient identification was confirmed, informed consent was obtained, and patient was prepped using Betadine solution.  Lidocaine jelly was administered per urethral meatus.    Procedure: - Flexible cystoscope introduced, without any difficulty.   - Thorough search of the bladder revealed:    normal urethral meatus    normal urothelium    no stones    no ulcers     no tumors    no urethral polyps    no trabeculation  - Ureteral orifices were normal in position and appearance.  Post-Procedure: - Patient tolerated the procedure well  Pertinent Imagings:  CLINICAL DATA:  Microscopic hematuria.  EXAM: RENAL / URINARY TRACT ULTRASOUND COMPLETE  COMPARISON:  CT abdomen/pelvis  01/20/2017  FINDINGS: Right Kidney:  Renal measurements: 10.1 x 3.3 x 5.0 cm = volume: 86 mL . Echogenicity within normal limits. No mass, calculus or hydronephrosis visualized.  Left Kidney:  Renal measurements: 10.6 x 5.4 x 5.0 cm = volume: 152 mL. Echogenicity within normal limits. No mass, calculus or hydronephrosis visualized.  Bladder:  Visualized portions unremarkable for the degree of distension. Bilateral ureteral jets are demonstrated  IMPRESSION: Renal parenchymal volumes as provided.  No sonographic evidence of renal mass or renal calculi. No hydronephrosis.   Electronically Signed   By: Kellie Simmering DO   On: 08/02/2019 16:11  I have personally reviewed the images and agree with radiologist interpretation.   Assessment/ Plan:  1. Microscopic hematuria  RUS from 08/02/19 unremarkable. Cysto revealed NED  Return on calcium and Vitamin D Will check UA w/ PCP once a year and will return prn   I, Nethusan Sivanesan, am acting as a scribe for Dr. Hollice Espy,  I have reviewed the above documentation for accuracy and completeness, and I agree with the above.   Hollice Espy, MD

## 2019-08-27 ENCOUNTER — Other Ambulatory Visit: Payer: Self-pay

## 2019-08-27 ENCOUNTER — Encounter: Payer: Self-pay | Admitting: Urology

## 2019-08-27 ENCOUNTER — Ambulatory Visit (INDEPENDENT_AMBULATORY_CARE_PROVIDER_SITE_OTHER): Payer: Medicare Other | Admitting: Urology

## 2019-08-27 VITALS — BP 138/87 | HR 78

## 2019-08-27 DIAGNOSIS — R3129 Other microscopic hematuria: Secondary | ICD-10-CM | POA: Diagnosis not present

## 2019-08-27 LAB — URINALYSIS, COMPLETE
Bilirubin, UA: NEGATIVE
Glucose, UA: NEGATIVE
Nitrite, UA: NEGATIVE
Protein,UA: NEGATIVE
Specific Gravity, UA: 1.025 (ref 1.005–1.030)
Urobilinogen, Ur: 0.2 mg/dL (ref 0.2–1.0)
pH, UA: 5 (ref 5.0–7.5)

## 2019-08-27 LAB — MICROSCOPIC EXAMINATION

## 2019-08-28 ENCOUNTER — Ambulatory Visit (INDEPENDENT_AMBULATORY_CARE_PROVIDER_SITE_OTHER): Payer: Medicare Other | Admitting: Obstetrics & Gynecology

## 2019-08-28 ENCOUNTER — Encounter: Payer: Self-pay | Admitting: Obstetrics & Gynecology

## 2019-08-28 VITALS — BP 112/70 | Ht 65.75 in | Wt 135.0 lb

## 2019-08-28 DIAGNOSIS — Z01419 Encounter for gynecological examination (general) (routine) without abnormal findings: Secondary | ICD-10-CM

## 2019-08-28 DIAGNOSIS — M8589 Other specified disorders of bone density and structure, multiple sites: Secondary | ICD-10-CM

## 2019-08-28 DIAGNOSIS — Z780 Asymptomatic menopausal state: Secondary | ICD-10-CM

## 2019-08-28 NOTE — Progress Notes (Signed)
Barbara Odom 05/03/1950 [id number]   History:    69 y.o. G2P2L2 Married.  Retired Marine scientist.  Will move to [facility].  RP:  Established patient presenting for annual gyn exam   HPI: Menopausal, well on no hormone replacement therapy. No postmenopausal bleeding. No pelvic pain. Normal vaginal secretions. Sexually active with no problem. Breasts normal. Urine and bowel movements normal. BMI 21.96. Good fitness and nutrition.  Health labs with family physician.  Colono 02/2017.   Past medical history,surgical history, family history and social history were all reviewed and documented in the EPIC chart.  Gynecologic History No LMP recorded. Patient is postmenopausal.  Obstetric History OB History  Gravida Para Term Preterm AB Living  2 2       2   SAB TAB Ectopic Multiple Live Births               # Outcome Date GA Lbr Len/2nd Weight Sex Delivery Anes PTL Lv  2 Para           1 Para              ROS: A ROS was performed and pertinent positives and negatives are included in the history.  GENERAL: No fevers or chills. HEENT: No change in vision, no earache, sore throat or sinus congestion. NECK: No pain or stiffness. CARDIOVASCULAR: No chest pain or pressure. No palpitations. PULMONARY: No shortness of breath, cough or wheeze. GASTROINTESTINAL: No abdominal pain, nausea, vomiting or diarrhea, melena or bright red blood per rectum. GENITOURINARY: No urinary frequency, urgency, hesitancy or dysuria. MUSCULOSKELETAL: No joint or muscle pain, no back pain, no recent trauma. DERMATOLOGIC: No rash, no itching, no lesions. ENDOCRINE: No polyuria, polydipsia, no heat or cold intolerance. No recent change in weight. HEMATOLOGICAL: No anemia or easy bruising or bleeding. NEUROLOGIC: No headache, seizures, numbness, tingling or weakness. PSYCHIATRIC: No depression, no loss of interest in normal activity or change in sleep pattern.     Exam:   BP 112/70   Ht 5' 5.75" (1.67 m)   Wt 135  lb (61.2 kg)   BMI 21.96 kg/m   Body mass index is 21.96 kg/m.  General appearance : Well developed well nourished female. No acute distress HEENT: Eyes: no retinal hemorrhage or exudates,  Neck supple, trachea midline, no carotid bruits, no thyroidmegaly Lungs: Clear to auscultation, no rhonchi or wheezes, or rib retractions  Heart: Regular rate and rhythm, no murmurs or gallops Breast:Examined in sitting and supine position were symmetrical in appearance, no palpable masses or tenderness,  no skin retraction, no nipple inversion, no nipple discharge, no skin discoloration, no axillary or supraclavicular lymphadenopathy Abdomen: no palpable masses or tenderness, no rebound or guarding Extremities: no edema or skin discoloration or tenderness  Pelvic: Vulva: Normal             Vagina: No gross lesions or discharge  Cervix: No gross lesions or discharge  Uterus  RV, normal size, shape and consistency, non-tender and mobile  Adnexa  Without masses or tenderness  Anus: Normal   Assessment/Plan:  69 y.o. female for annual exam   1. Well female exam with routine gynecological exam Normal gynecologic exam in menopause.  Pap 05/2018 Negative, no indication to repeat this year.  Breast exam normal.  Screening Mammo 10/2018 Negative.  Colono 02/2017.  BMI good at 21.96.  Continue with fitness and healthy nutrition.  2. Postmenopausal Well on no HRT.  No PMB.    3. Osteopenia of multiple  sites Bone Density 02/2019 Osteopenia T-Score -2.3 at the Left Femoral Neck.  Continue with Vit D supplements, Ca++ 1200 mg daily and regular weight bearing physical activities.  Repeat BD 02/2021.  Princess Bruins MD, 3:21 PM 08/28/2019

## 2019-08-29 ENCOUNTER — Encounter: Payer: Self-pay | Admitting: Obstetrics & Gynecology

## 2019-08-29 DIAGNOSIS — D2271 Melanocytic nevi of right lower limb, including hip: Secondary | ICD-10-CM | POA: Diagnosis not present

## 2019-08-29 DIAGNOSIS — D2272 Melanocytic nevi of left lower limb, including hip: Secondary | ICD-10-CM | POA: Diagnosis not present

## 2019-08-29 DIAGNOSIS — D2261 Melanocytic nevi of right upper limb, including shoulder: Secondary | ICD-10-CM | POA: Diagnosis not present

## 2019-08-29 DIAGNOSIS — D225 Melanocytic nevi of trunk: Secondary | ICD-10-CM | POA: Diagnosis not present

## 2019-08-29 DIAGNOSIS — D2262 Melanocytic nevi of left upper limb, including shoulder: Secondary | ICD-10-CM | POA: Diagnosis not present

## 2019-08-29 NOTE — Patient Instructions (Signed)
1. Well female exam with routine gynecological exam Normal gynecologic exam in menopause.  Pap 05/2018 Negative, no indication to repeat this year.  Breast exam normal.  Screening Mammo 10/2018 Negative.  [place] 02/2017.  BMI good at 21.96.  Continue with fitness and healthy nutrition.  2. Postmenopausal Well on no HRT.  No PMB.    3. Osteopenia of multiple sites Bone Density 02/2019 Osteopenia T-Score -2.3 at the Left Femoral Neck.  Continue with Vit D supplements, Ca++ 1200 mg daily and regular weight bearing physical activities.  Repeat BD 02/2021.  Marvia, it was a real pleasure seeing you today, as always!  Hope to see you next year... in the meantime, the best with your retirement projects!

## 2019-09-04 ENCOUNTER — Other Ambulatory Visit: Payer: Self-pay

## 2019-09-04 ENCOUNTER — Ambulatory Visit: Payer: Medicare Other | Admitting: Pulmonary Disease

## 2019-09-04 ENCOUNTER — Encounter: Payer: Self-pay | Admitting: Pulmonary Disease

## 2019-09-04 VITALS — BP 122/74 | HR 78 | Temp 97.5°F | Ht 65.75 in | Wt 136.4 lb

## 2019-09-04 DIAGNOSIS — J479 Bronchiectasis, uncomplicated: Secondary | ICD-10-CM

## 2019-09-04 DIAGNOSIS — J4540 Moderate persistent asthma, uncomplicated: Secondary | ICD-10-CM

## 2019-09-04 MED ORDER — AZITHROMYCIN 250 MG PO TABS: ORAL_TABLET | ORAL | 1 refills | Status: AC

## 2019-09-04 MED ORDER — BREO ELLIPTA 100-25 MCG/INH IN AEPB: 1.0000 | INHALATION_SPRAY | Freq: Every day | RESPIRATORY_TRACT | 0 refills | Status: AC

## 2019-09-04 NOTE — Patient Instructions (Signed)
Give a try to the decreased dose of Breo you have 2-week supply in the inhaler provided.  Let us know how that goes for you.  I have switched your as needed antibiotic for bronchiectasis flare to Zithromax prescription sent to the pharmacy.   We will see her in follow-up in 3 months time call sooner should any new difficulties arise.

## 2019-09-04 NOTE — Progress Notes (Signed)
    Assessment & Plan:  1. Moderate persistent asthma without complication (Primary)  2. Mild bronchiectasis without complication [facility])   Patient Instructions  Give a try to the decreased dose of Breo you have 2-week supply in the inhaler provided.  Let us  know how that goes for you.  I have switched your as needed antibiotic for bronchiectasis flare to Zithromax  prescription sent to the pharmacy.   We will see her in follow-up in 3 months time call sooner should any new difficulties arise.    Please note: late entry documentation due to logistical difficulties during COVID-19 pandemic. This note is filed for information purposes only, and is not intended to be used for billing, nor does it represent the full scope/nature of the visit in question. Please see any associated scanned media linked to date of encounter for additional pertinent information.  Subjective:    HPI: Barbara Odom is a 69 y.o. female presenting to the pulmonology clinic on 09/04/2019 with report of: Follow-up (pt states breathing is doing well. )     Outpatient Encounter Medications as of 09/04/2019  Medication Sig   Cranberry-Vitamin C (CRANBERRY CONCENTRATE/VITAMINC PO) Take 4,200 mg by mouth daily.   Probiotic Product (SOLUBLE FIBER/PROBIOTICS PO) Take 1 capsule by mouth daily.   vitamin B-12 (CYANOCOBALAMIN ) 1000 MCG tablet Take 1,000 mcg by mouth daily.   [DISCONTINUED] albuterol  (PROVENTIL  HFA;VENTOLIN  HFA) 108 (90 Base) MCG/ACT inhaler Inhale 1-2 puffs into the lungs every 4 (four) hours as needed for wheezing or shortness of breath.   [DISCONTINUED] Ascorbic Acid (VITAMIN C) 1000 MG tablet Take 1,000 mg by mouth daily.   [DISCONTINUED] Biotin 5000 MCG CAPS Take 1 capsule by mouth. Daily   [DISCONTINUED] cetirizine (ZYRTEC) 10 MG tablet Take 10 mg by mouth daily.   [DISCONTINUED] diazepam  (VALIUM ) 5 MG tablet Take 5 mg by mouth every 8 (eight) hours as needed.   [DISCONTINUED] Fexofenadine HCl  (ALLEGRA ALLERGY PO) Take by mouth.   [DISCONTINUED] fluticasone  furoate-vilanterol (BREO ELLIPTA ) 200-25 MCG/INH AEPB Inhale 1 puff into the lungs daily.   [DISCONTINUED] fluticasone  furoate-vilanterol (BREO ELLIPTA ) 200-25 MCG/INH AEPB Inhale 1 puff into the lungs daily.   [DISCONTINUED] GLUCOSAMINE-CHONDROITIN PO Take 1 tablet by mouth daily.   [DISCONTINUED] Magnesium 400 MG CAPS Take 1 capsule by mouth daily.   [DISCONTINUED] Multiple Vitamin (MULTIVITAMIN) tablet Take 1 tablet by mouth daily.   [DISCONTINUED] PHENObarbital  (LUMINAL) 60 MG tablet TAKE 1 TABLET BY MOUTH EVERY DAY   [DISCONTINUED] rizatriptan  (MAXALT ) 10 MG tablet TAKE 1 TABLET BY MOUTH AS NEEDED FOR MIGRAINE. MAY REPEAT IN 2 HOURS IF NEEDED   [DISCONTINUED] zolpidem  (AMBIEN ) 10 MG tablet Take 1 tablet (10 mg total) by mouth at bedtime as needed for sleep.   [EXPIRED] azithromycin  (ZITHROMAX ) 250 MG tablet Take 2 tablets (500 mg) on  Day 1,  followed by 1 tablet (250 mg) once daily on Days 2 through 5.   [EXPIRED] fluticasone  furoate-vilanterol (BREO ELLIPTA ) 100-25 MCG/INH AEPB Inhale 1 puff into the lungs daily for 1 day.   No facility-administered encounter medications on file as of 09/04/2019.      Objective:   Vitals:   09/04/19 1024  BP: 122/74  Pulse: 78  Temp: (!) 97.5 F (36.4 C)  Height: 5' 5.75 (1.67 m)  Weight: 136 lb 6.4 oz (61.9 kg)  SpO2: 99%  TempSrc: Temporal  BMI (Calculated): 22.18     Physical exam documentation is limited by delayed entry of information.

## 2019-09-20 ENCOUNTER — Other Ambulatory Visit: Payer: Self-pay | Admitting: Internal Medicine

## 2019-09-20 DIAGNOSIS — G40909 Epilepsy, unspecified, not intractable, without status epilepticus: Secondary | ICD-10-CM

## 2019-10-18 ENCOUNTER — Telehealth: Payer: Self-pay | Admitting: Pulmonary Disease

## 2019-10-18 MED ORDER — FLUCONAZOLE 100 MG PO TABS: 100.0000 mg | ORAL_TABLET | Freq: Every day | ORAL | 1 refills | Status: AC

## 2019-10-18 NOTE — Telephone Encounter (Signed)
Called and spoke to pt, who is requesting a Rx for Diflucan.  Pt states that she has thrush. Her tongue is coated white and she has a sore throat mainly in the morning.  Pt rinses with water and a little baking soda after every use of Breo.   Dr. Patsey Berthold, please advise. Thanks

## 2019-10-18 NOTE — Telephone Encounter (Signed)
Pt called back and made her aware prescription was sent in to pharmacy.

## 2019-10-18 NOTE — Telephone Encounter (Signed)
breo was decreased at last OV.  I have made Dr. Patsey Berthold aware of this verbally. Dr. Patsey Berthold recommends switching to anoro, and providing pt with a sample.  Pt should stop Breo for a few days to help with thrush and then start Anoro.  Will await call back from pt

## 2019-10-18 NOTE — Telephone Encounter (Signed)
We can give her Diflucan 100 mg, 1 tablet daily for 7 days with 1 refill.  She is on the 200 mcg size Breo.  We could have her hold off use of Breo until her thrush improves and then decrease the Breo to 100 mcg size to see if that helps.

## 2019-10-18 NOTE — Telephone Encounter (Signed)
Rx for Diflican 827 has been sent to [facility].  Lm to make pt aware.

## 2019-10-21 NOTE — Telephone Encounter (Signed)
Called and spoke to pt and relayed below message.  Pt stated that she will be leaving for [place] in a few days. Once she is back she will call her pharmacy to confirm price of Anoro. If she wishes to proceed with Anoro, she will call our office for a prescription. Nothing further is needed at this time.

## 2019-10-27 DIAGNOSIS — Z20828 Contact with and (suspected) exposure to other viral communicable diseases: Secondary | ICD-10-CM | POA: Diagnosis not present

## 2019-11-11 ENCOUNTER — Telehealth: Payer: Self-pay | Admitting: Pulmonary Disease

## 2019-11-11 MED ORDER — ANORO ELLIPTA 62.5-25 MCG/INH IN AEPB
1.0000 | INHALATION_SPRAY | Freq: Every day | RESPIRATORY_TRACT | 5 refills | Status: AC
Start: 2019-11-11 — End: 2019-12-11

## 2019-11-11 NOTE — Telephone Encounter (Signed)
Spoke to patient, who is requesting a Rx for Anoro. (please see 10/18/2019 phone note). Patient sated that she cut back on Breo due to thrush. She used breo every two or three days and did well.  Rx for Anoro has been sent to preferred pharmacy.  Nothing further is needed.

## 2019-11-14 ENCOUNTER — Other Ambulatory Visit: Payer: Self-pay | Admitting: Internal Medicine

## 2019-11-14 DIAGNOSIS — Z1231 Encounter for screening mammogram for malignant neoplasm of breast: Secondary | ICD-10-CM

## 2019-12-16 DIAGNOSIS — M5136 Other intervertebral disc degeneration, lumbar region: Secondary | ICD-10-CM | POA: Diagnosis not present

## 2019-12-18 ENCOUNTER — Other Ambulatory Visit: Payer: Self-pay

## 2019-12-18 ENCOUNTER — Ambulatory Visit (INDEPENDENT_AMBULATORY_CARE_PROVIDER_SITE_OTHER): Payer: Medicare Other | Admitting: Podiatry

## 2019-12-18 ENCOUNTER — Encounter: Payer: Self-pay | Admitting: Podiatry

## 2019-12-18 ENCOUNTER — Ambulatory Visit: Payer: Medicare Other

## 2019-12-18 DIAGNOSIS — M2041 Other hammer toe(s) (acquired), right foot: Secondary | ICD-10-CM | POA: Diagnosis not present

## 2019-12-18 NOTE — Progress Notes (Signed)
Subjective:  Patient ID: Barbara Odom, female    DOB: 31-Dec-1950,  MRN: [id number] HPI Chief Complaint  Patient presents with  . Toe Pain    5th toe right - sits under the 4th toe, discomfort with exercise, shoes or prolonged walking, doesn't want surgery to correct, would like other options for now  . New Patient (Initial Visit)    69 y.o. female presents with the above complaint.   ROS: Denies fever chills nausea vomiting muscle aches pains calf pain back pain chest pain shortness of breath.  Past Medical History:  Diagnosis Date  . Bronchiectasis ([place])   . 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 History of diverticulitis of colon   . MAI (mycobacterium avium-intracellulare) ([place])   . Migraine headache   . Osteopenia   . Pneumonia    multi-lobe   . seasonal rhinitis    Past Surgical History:  Procedure Laterality Date  . BREAST BIOPSY Right 2005   Negative- clip placed  . BREAST EXCISIONAL BIOPSY Left 1997   Negative --- Pt states this procedure was a lumpect.  Marland Kitchen BREAST SURGERY     lumpectomy, benign   . CARPAL TUNNEL RELEASE  1983   bilateral   . CERVICAL DISCECTOMY  August 2012   [place]    Current Outpatient Medications:  .  umeclidinium-vilanterol (ANORO ELLIPTA) 62.5-25 MCG/INH AEPB, , Disp: , Rfl:  .  Ascorbic Acid (VITAMIN C) 1000 MG tablet, Take 1,000 mg by mouth daily., Disp: , Rfl:  .  Biotin 5000 MCG CAPS, Take 1 capsule by mouth. Daily, Disp: , Rfl:  .  cetirizine (ZYRTEC) 10 MG tablet, Take 10 mg by mouth daily., Disp: , Rfl:  .  Cranberry-Vitamin C (CRANBERRY CONCENTRATE/VITAMINC PO), Take 4,200 mg by mouth daily., Disp: , Rfl:  .  diazepam (VALIUM) 5 MG tablet, Take 5 mg by mouth every 8 (eight) hours as needed., Disp: , Rfl:  .  Fexofenadine HCl (ALLEGRA ALLERGY PO), Take by mouth., Disp: , Rfl:  .  GLUCOSAMINE-CHONDROITIN PO, Take 1 tablet by mouth daily., Disp: , Rfl:  .  Magnesium  400 MG CAPS, Take 1 capsule by mouth daily., Disp: , Rfl:  .  Multiple Vitamin (MULTIVITAMIN) tablet, Take 1 tablet by mouth daily.  , Disp: , Rfl:  .  PHENObarbital (LUMINAL) 60 MG tablet, TAKE 1 TABLET BY MOUTH EVERY DAY, Disp: 90 tablet, Rfl: 3 .  Probiotic Product (SOLUBLE FIBER/PROBIOTICS PO), Take 1 capsule by mouth daily., Disp: , Rfl:  .  rizatriptan (MAXALT) 10 MG tablet, TAKE 1 TABLET BY MOUTH AS NEEDED FOR MIGRAINE. MAY REPEAT IN 2 HOURS IF NEEDED, Disp: 18 tablet, Rfl: 6 .  vitamin B-12 (CYANOCOBALAMIN) 1000 MCG tablet, Take 1,000 mcg by mouth daily., Disp: , Rfl:  .  zolpidem (AMBIEN) 10 MG tablet, Take 1 tablet (10 mg total) by mouth at bedtime as needed for sleep., Disp: 30 tablet, Rfl: 5  Allergies  Allergen Reactions  . Penicillins Other (See Comments)    Reaction occurred as a child; "mom described something that sounds like bronchospasms"   Review of Systems Objective:  There were no vitals filed for this visit.  General: Well developed, nourished, in no acute distress, alert and oriented x3   Dermatological: Skin is warm, dry and supple bilateral. Nails x 10 are well maintained; remaining integument appears unremarkable at this time. There are no open sores, no preulcerative lesions,  no rash or signs of infection present.  Vascular: Dorsalis Pedis artery and Posterior Tibial artery pedal pulses are 2/4 bilateral with immedate capillary fill time. Pedal hair growth present. No varicosities and no lower extremity edema present bilateral.   Neruologic: Grossly intact via light touch bilateral. Vibratory intact via tuning fork bilateral. Protective threshold with Semmes Wienstein monofilament intact to all pedal sites bilateral. Patellar and Achilles deep tendon reflexes 2+ bilateral. No Babinski or clonus noted bilateral.   Musculoskeletal: No gross boney pedal deformities bilateral. No pain, crepitus, or limitation noted with foot and ankle range of motion bilateral.  Muscular strength 5/5 in all groups tested bilateral.  Adductovarus rotated hammertoe deformity fifth right mild tailor's bunion deformity fifth right  Gait: Unassisted, Nonantalgic.    Radiographs:  None taken  Assessment & Plan:   Assessment: Rotated hammertoe deformity fifth right  Plan: Discussed surgical intervention with her provided her with tape to keep the toe in a rectus position while exercising she likes this and she will follow up with [place] on an as-needed basis may need to consider a flexor tendon tenotomy prior to an arthroplasty.     Lenardo Westwood T. [place], [place]

## 2019-12-20 ENCOUNTER — Other Ambulatory Visit: Payer: Self-pay

## 2019-12-20 ENCOUNTER — Encounter (HOSPITAL_BASED_OUTPATIENT_CLINIC_OR_DEPARTMENT_OTHER): Payer: Self-pay | Admitting: *Deleted

## 2019-12-20 ENCOUNTER — Ambulatory Visit (INDEPENDENT_AMBULATORY_CARE_PROVIDER_SITE_OTHER)
Admission: EM | Admit: 2019-12-20 | Discharge: 2019-12-20 | Disposition: A | Discharge status: Home / Self Care | Payer: Medicare Other | Source: Home / Self Care

## 2019-12-20 ENCOUNTER — Encounter: Payer: Self-pay | Admitting: Emergency Medicine

## 2019-12-20 ENCOUNTER — Telehealth: Payer: Self-pay

## 2019-12-20 ENCOUNTER — Emergency Department (HOSPITAL_BASED_OUTPATIENT_CLINIC_OR_DEPARTMENT_OTHER)
Admission: EM | Admit: 2019-12-20 | Discharge: 2019-12-20 | Disposition: A | Discharge status: Home / Self Care | Payer: Medicare Other | Attending: Emergency Medicine | Admitting: Emergency Medicine

## 2019-12-20 ENCOUNTER — Emergency Department (HOSPITAL_BASED_OUTPATIENT_CLINIC_OR_DEPARTMENT_OTHER): Payer: Medicare Other

## 2019-12-20 DIAGNOSIS — E876 Hypokalemia: Secondary | ICD-10-CM

## 2019-12-20 DIAGNOSIS — R079 Chest pain, unspecified: Secondary | ICD-10-CM

## 2019-12-20 DIAGNOSIS — R61 Generalized hyperhidrosis: Secondary | ICD-10-CM | POA: Diagnosis not present

## 2019-12-20 DIAGNOSIS — R072 Precordial pain: Secondary | ICD-10-CM | POA: Diagnosis not present

## 2019-12-20 LAB — BASIC METABOLIC PANEL
Anion gap: 10 (ref 5–15)
BUN: 14 mg/dL (ref 8–23)
CO2: 27 mmol/L (ref 22–32)
Calcium: 9.1 mg/dL (ref 8.9–10.3)
Chloride: 99 mmol/L (ref 98–111)
Creatinine, Ser: 0.7 mg/dL (ref 0.44–1.00)
GFR calc Af Amer: >60 mL/min (ref >60)
GFR calc non Af Amer: >60 mL/min (ref >60)
Glucose, Bld: 114 mg/dL — ABNORMAL HIGH (ref 70–99)
Potassium: 2.9 mmol/L — ABNORMAL LOW (ref 3.5–5.1)
Sodium: 136 mmol/L (ref 135–145)

## 2019-12-20 LAB — CBC
HCT: 40.7 % (ref 36.0–46.0)
Hemoglobin: 13.6 g/dL (ref 12.0–15.0)
MCH: 30.8 pg (ref 26.0–34.0)
MCHC: 33.4 g/dL (ref 30.0–36.0)
MCV: 92.1 fL (ref 80.0–100.0)
Platelets: 311 10*3/uL (ref 150–400)
RBC: 4.42 MIL/uL (ref 3.87–5.11)
RDW: 12.2 % (ref 11.5–15.5)
WBC: 6.6 10*3/uL (ref 4.0–10.5)
nRBC: 0 % (ref 0.0–0.2)

## 2019-12-20 LAB — TROPONIN I (HIGH SENSITIVITY)
Troponin I (High Sensitivity): 3 ng/L (ref <18)
Troponin I (High Sensitivity): 4 ng/L (ref <18)

## 2019-12-20 LAB — D-DIMER, QUANTITATIVE: D-Dimer, Quant: 0.72 ug/mL-FEU — ABNORMAL HIGH (ref 0.00–0.50)

## 2019-12-20 MED ORDER — POTASSIUM CHLORIDE CRYS ER 20 MEQ PO TBCR
40.0000 meq | EXTENDED_RELEASE_TABLET | Freq: Once | ORAL | Status: AC
  Administered 2019-12-20: 40 meq via ORAL
  Filled 2019-12-20: qty 2

## 2019-12-20 MED ORDER — IOHEXOL 350 MG/ML SOLN
100.0000 mL | Freq: Once | INTRAVENOUS | Status: AC | PRN
  Administered 2019-12-20: 100 mL via INTRAVENOUS

## 2019-12-20 MED ORDER — ASPIRIN 81 MG PO CHEW
324.0000 mg | CHEWABLE_TABLET | Freq: Once | ORAL | Status: AC
  Administered 2019-12-20: 324 mg via ORAL
  Filled 2019-12-20: qty 4

## 2019-12-20 MED ORDER — NITROGLYCERIN 0.4 MG SL SUBL: 0.4000 mg | SUBLINGUAL_TABLET | SUBLINGUAL | Status: DC | PRN

## 2019-12-20 NOTE — ED Triage Notes (Signed)
Pt c/o right sided chest pressure. 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 She is denying jaw pain, diaphoresis. She states she traveled recently by plane to [facility] one way. She is concerned for a PE or walking pneumonia. She is requesting a chest xray and a CMET and lipase. She declines an EKG or cardiac workup. She states "i'm not trying to tell you all what to do" she states she has confirmed with Dr Derrel Nip what she needs.

## 2019-12-20 NOTE — ED Provider Notes (Signed)
[facility]    CSN: [id number] Arrival date & time: 12/20/19  1242      History   Chief Complaint Chief Complaint  Patient presents with  . Chest Pain    HPI Barbara Odom is a 69 y.o. female.   Reports that she has been experiencing left sided chest pain and pressure. Reports that this has been going on for a month.  Reports that she thinks that it is getting worse.  Reports that it is not slowing her down, she went to the gym yesterday and did not have worsening pain.  Reports that she tried to get in to see Dr. Derrel Nip today but that she did not have any availability.  Patient is requesting an EKG, chest x-ray and labs.  Reports that she has been on a plane recently, she is states that she is concerned for a PE.  Denies diaphoresis, radiating pain, nausea, vomiting, diarrhea, shortness of breath, increased work of breathing, other pain, rash, fever, other symptoms.  ROS per HPI  The history is provided by the patient.  Chest Pain   Past Medical History:  Diagnosis Date  . Bronchiectasis ([place])   . 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 History of diverticulitis of colon   . MAI (mycobacterium avium-intracellulare) ([place])   . Migraine headache   . Osteopenia   . Pneumonia    multi-lobe   . seasonal rhinitis     Patient Active Problem List   Diagnosis Date Noted  . 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 Hepatic steatosis 01/20/2017  . Aortic atherosclerosis ([place]) 01/20/2017  . Fatigue 12/17/2016  . Abnormal EKG 10/08/2016  . History of hematuria 11/16/2014  . Encounter for preventive health examination 11/16/2014  . Bronchitis, chronic obstructive w acute bronchitis ([place]) 06/08/2013  . Cystitis, interstitial 02/21/2013  . Lumbago without sciatica 02/26/2011  . Seizure disorder ([place])    . Migraine headache   . History of diverticulitis of colon   . Osteopenia   . Screening for colon cancer 02/23/2011  . Screening for cervical cancer 02/23/2011  . [place] DISEASE, CERVICAL 03/02/2007  . ALLERGIC RHINITIS 03/01/2007  . DIVERTICULOSIS, COLON 03/01/2007    Past Surgical History:  Procedure Laterality Date  . BREAST BIOPSY Right 2005   Negative- clip placed  . BREAST EXCISIONAL BIOPSY Left 1997   Negative --- Pt states this procedure was a lumpect.  Marland Kitchen BREAST SURGERY     lumpectomy, benign   . CARPAL TUNNEL RELEASE  1983   bilateral   . CERVICAL DISCECTOMY  August 2012   [place]    OB History    Gravida  2   Para  2   Term      Preterm      AB      Living  2     SAB      TAB      Ectopic      Multiple      Live Births               Home Medications    Prior to Admission medications   Medication Sig Start Date End Date Taking? Authorizing Provider  Ascorbic Acid (VITAMIN C) 1000 MG tablet Take 1,000 mg by mouth daily.    [provider]  Biotin 5000 MCG CAPS Take 1  capsule by mouth. Daily    [provider]  cetirizine (ZYRTEC) 10 MG tablet Take 10 mg by mouth daily.    [provider]  Cranberry-Vitamin C (CRANBERRY CONCENTRATE/VITAMINC PO) Take 4,200 mg by mouth daily.    [provider]  diazepam (VALIUM) 5 MG tablet Take 5 mg by mouth every 8 (eight) hours as needed. 11/12/18   [provider]  Fexofenadine HCl (ALLEGRA ALLERGY PO) Take by mouth.    [provider]  GLUCOSAMINE-CHONDROITIN PO Take 1 tablet by mouth daily.    [provider]  Magnesium 400 MG CAPS Take 1 capsule by mouth daily.    [provider]  Multiple Vitamin (MULTIVITAMIN) tablet Take 1 tablet by mouth daily.      [provider]  PHENObarbital (LUMINAL) 60 MG tablet TAKE 1 TABLET BY MOUTH EVERY DAY 09/20/19   Crecencio Mc, MD  Probiotic Product (SOLUBLE FIBER/PROBIOTICS PO) Take 1  capsule by mouth daily.    [provider]  rizatriptan (MAXALT) 10 MG tablet TAKE 1 TABLET BY MOUTH AS NEEDED FOR MIGRAINE. MAY REPEAT IN 2 HOURS IF NEEDED 12/11/18   Crecencio Mc, MD  umeclidinium-vilanterol [facility] ELLIPTA) 62.5-25 MCG/INH AEPB  11/20/19   [provider]  vitamin B-12 (CYANOCOBALAMIN) 1000 MCG tablet Take 1,000 mcg by mouth daily.    [provider]  zolpidem (AMBIEN) 10 MG tablet Take 1 tablet (10 mg total) by mouth at bedtime as needed for sleep. 02/12/19   Crecencio Mc, MD    Family History Family History  Problem Relation Age of Onset  . Heart disease Mother        Atrial fibrillation  . Coronary artery disease Mother   . Cancer Maternal Grandmother        colon Ca  . Cancer Maternal Grandfather        Colon CA  . Macular degeneration Father   . Congestive Heart Failure Father     Social History Social History   Tobacco Use  . Smoking status: Never Smoker  . Smokeless tobacco: Never Used  Vaping Use  . Vaping Use: Never used  Substance Use Topics  . Alcohol use: Yes    Comment: socially   . Drug use: No     Allergies   Penicillins   Review of Systems Review of Systems  Cardiovascular: Positive for chest pain.     Physical Exam Triage Vital Signs ED Triage Vitals [12/20/19 1354]  Enc Vitals Group     BP (!) 183/89     Pulse Rate 86     Resp      Temp 99.1 F (37.3 C)     Temp Source Oral     SpO2 96 %     Weight      Height      Head Circumference      Peak Flow      Pain Score 0     Pain Loc      Pain Edu?      Excl. in [place]?    No data found.  Updated Vital Signs BP (!) 183/89 (BP Location: Right Arm)   Pulse 86   Temp 99.1 F (37.3 C) (Oral)   SpO2 96%      Physical Exam Vitals and nursing note reviewed.  Constitutional:      General: She is not in acute distress.    Appearance: She is well-developed and normal weight. She is not ill-appearing,  toxic-appearing or diaphoretic.  HENT:      Head: Normocephalic and atraumatic.  Eyes:     Extraocular Movements: Extraocular movements intact.     Conjunctiva/sclera: Conjunctivae normal.     Pupils: Pupils are equal, round, and reactive to light.  Neck:     Thyroid: No thyromegaly.     Vascular: No hepatojugular reflux or JVD.     Trachea: No tracheal deviation.  Cardiovascular:     Rate and Rhythm: Normal rate and regular rhythm.     Heart sounds: Normal heart sounds. Heart sounds not distant. No murmur heard.   Pulmonary:     Effort: Pulmonary effort is normal. No tachypnea, accessory muscle usage or respiratory distress.     Breath sounds: Normal breath sounds. No stridor. No decreased breath sounds, wheezing, rhonchi or rales.  Abdominal:     Palpations: Abdomen is soft.     Tenderness: There is no abdominal tenderness.  Musculoskeletal:        General: Normal range of motion.     Cervical back: Normal range of motion and neck supple.     Right lower leg: No tenderness. No edema.     Left lower leg: No tenderness. No edema.  Lymphadenopathy:     Cervical: No cervical adenopathy.  Skin:    General: Skin is warm and dry.     Capillary Refill: Capillary refill takes less than 2 seconds.  Neurological:     General: No focal deficit present.     Mental Status: She is alert.  Psychiatric:        Mood and Affect: Mood normal.        Behavior: Behavior normal.      UC Treatments / Results  Labs (all labs ordered are listed, but only abnormal results are displayed) Labs Reviewed - No data to display  EKG   Radiology   Procedures Procedures (including critical care time)  Medications Ordered in UC Medications - No data to display  Initial Impression / Assessment and Plan / UC Course  I have reviewed the triage vital signs and the nursing notes.  Pertinent labs & imaging results that were available during my care of the patient were reviewed by me and considered in my medical decision making (see  chart for details).     Chest pain  Presents with gradually worsening chest pain that is been present for the last month Patient is concerned for PE, states that she is not going to go to the ER EKG done in the office with minimal ST elevation in V1,  Suggested that she needs a further work-up than what we can provide at the urgent care Discussed with patient that they would be able to do stat labs, if you needed a heart cath they could do that, if you need a CT to rule out a PE they have to do that in the ER as well Patient finally agrees and will go by personal vehicle to the ER for further evaluation and treatment Final Clinical Impressions(s) / UC Diagnoses   Final diagnoses:  Chest pain, unspecified type   Discharge Instructions   None    ED Prescriptions    None     PDMP not reviewed this encounter.   Faustino Congress, NP 12/21/19 1309

## 2019-12-20 NOTE — ED Notes (Signed)
Has Left Upper Sternal Chest Pain for the past several weeks. Rates pain as a 7 on 0-10scale. Describes pain as pressure. Denies SOB currently, denies fevers as well, denies cough. Currently on cont cardiac monitoring with cont POX and int NBP assessments, the primary concern is the pressure she is currently experiencing.

## 2019-12-20 NOTE — ED Notes (Signed)
EDP informed of K of 2.9

## 2019-12-20 NOTE — Telephone Encounter (Signed)
Pt called wanting to know if she could be seen by Dr. Derrel Nip for EKG & was told that she was booked. Then she wanted to see Joycelyn Schmid if she had an opening. I triaged patient & she stated that she was not concerned with CP. She is having this CP at rest on left side upper middle. No jaw pain, arm pain, SOB or diaphoretic. I advised patient that NO physician here would be okay with her waiting to be seen. The first opening was a f/u with Joycelyn Schmid in Wednesday, which I didn't offer bc she needs to be asap. She said that this has been going on for a couple weeks no & it's brought on by exercise either bc she regularly walks & works out. She absolutely refused ED & said that she would feel "silly" going to UC. I told her this was against medical advise. Pt said if it got worse then she would go to UC. Please advise further?

## 2019-12-20 NOTE — ED Notes (Signed)
CT aware of IV placement  

## 2019-12-20 NOTE — Discharge Instructions (Addendum)
You are seen in the emergency department for left-sided chest pain has been going on for a few weeks.  You had blood work EKG chest x-ray and a CAT scan of your chest that did not show any serious findings.  Your potassium was mildly low and you were given an oral supplement.  Please contact your primary care doctor for referral to cardiology.  Take an aspirin a day.  Return to the emergency department for any worsening or concerning symptoms.

## 2019-12-20 NOTE — ED Provider Notes (Signed)
[facility] Provider Note   CSN: [id number] Arrival date & time: 12/20/19  1535     History Chief Complaint  Patient presents with  . Chest Pain    [facility] is a 69 y.o. female.  She is here with a complaint of 2 weeks of worsening left-sided and central chest pain.  Does not seem to change with activity.  One time the episode made her diaphoretic.  Recent plane travel to [place].  Not worse with deep breath and does not feel short of breath.  No nausea or vomiting.  No trauma.  Is continued to exercise despite the pain and it does not seem to change with exercise.  No prior cardiac disease but does have family history of such.  The history is provided by the patient.  Chest Pain Pain location:  Substernal area Pain quality: aching   Pain radiates to:  Does not radiate Pain severity:  Moderate Onset quality:  Gradual Timing:  Intermittent Progression:  Worsening Chronicity:  New Relieved by:  Nothing Worsened by:  Nothing Ineffective treatments:  None tried Associated symptoms: diaphoresis   Associated symptoms: no abdominal pain, no back pain, no cough, no fever, no headache, no nausea, no shortness of breath and no vomiting   Risk factors: no smoking     HPI: A 69 year old patient presents for evaluation of chest pain. Initial onset of pain was more than 6 hours ago. The patient's chest pain is well-localized, is described as heaviness/pressure/tightness and is not worse with exertion. The patient's chest pain is middle- or left-sided, is not sharp and does not radiate to the arms/jaw/neck. The patient does not complain of nausea and denies diaphoresis. The patient has no history of stroke, has no history of peripheral artery disease, has not smoked in the past 90 days, denies any history of treated diabetes, has no relevant family history of coronary artery disease (first degree relative at less than age 26), is not hypertensive, has no  history of hypercholesterolemia and does not have an elevated BMI (>=30).   Past Medical History:  Diagnosis Date  . Bronchiectasis ([place])   . 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 History of diverticulitis of colon   . MAI (mycobacterium avium-intracellulare) ([place])   . Migraine headache   . Osteopenia   . Pneumonia    multi-lobe   . seasonal rhinitis     Patient Active Problem List   Diagnosis Date Noted  . 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 Hepatic steatosis 01/20/2017  . Aortic atherosclerosis ([place]) 01/20/2017  . Fatigue 12/17/2016  . Abnormal EKG 10/08/2016  . History of hematuria 11/16/2014  . Encounter for preventive health examination 11/16/2014  . Bronchitis, chronic obstructive w acute bronchitis ([place]) 06/08/2013  . Cystitis, interstitial 02/21/2013  . Lumbago without sciatica 02/26/2011  . Seizure disorder ([place])   . Migraine headache   . History of diverticulitis of colon   . Osteopenia   . Screening for colon cancer 02/23/2011  . Screening for cervical cancer 02/23/2011  . [place] DISEASE, CERVICAL 03/02/2007  . ALLERGIC RHINITIS 03/01/2007  . DIVERTICULOSIS, COLON 03/01/2007    Past Surgical History:  Procedure Laterality Date  . BREAST BIOPSY Right 2005   Negative- clip placed  . BREAST EXCISIONAL BIOPSY Left 1997   Negative --- Pt states this procedure  was a lumpect.  [facility] BREAST SURGERY     lumpectomy, benign   . CARPAL TUNNEL RELEASE  1983   bilateral   . CERVICAL DISCECTOMY  August 2012   [place]     OB History    Gravida  2   Para  2   Term      Preterm      AB      Living  2     SAB      TAB      Ectopic      Multiple      Live Births              Family History  Problem Relation Age of Onset  . Heart disease Mother        Atrial fibrillation  .  Coronary artery disease Mother   . Cancer Maternal Grandmother        colon Ca  . Cancer Maternal Grandfather        Colon CA  . Macular degeneration Father   . Congestive Heart Failure Father     Social History   Tobacco Use  . Smoking status: Never Smoker  . Smokeless tobacco: Never Used  Vaping Use  . Vaping Use: Never used  Substance Use Topics  . Alcohol use: Yes    Comment: socially   . Drug use: No    Home Medications Prior to Admission medications   Medication Sig Start Date End Date Taking? Authorizing Provider  Ascorbic Acid (VITAMIN C) 1000 MG tablet Take 1,000 mg by mouth daily.    [provider]  Biotin 5000 MCG CAPS Take 1 capsule by mouth. Daily    [provider]  cetirizine (ZYRTEC) 10 MG tablet Take 10 mg by mouth daily.    [provider]  Cranberry-Vitamin C (CRANBERRY CONCENTRATE/VITAMINC PO) Take 4,200 mg by mouth daily.    [provider]  diazepam (VALIUM) 5 MG tablet Take 5 mg by mouth every 8 (eight) hours as needed. 11/12/18   [provider]  Fexofenadine HCl (ALLEGRA ALLERGY PO) Take by mouth.    [provider]  GLUCOSAMINE-CHONDROITIN PO Take 1 tablet by mouth daily.    [provider]  Magnesium 400 MG CAPS Take 1 capsule by mouth daily.    [provider]  Multiple Vitamin (MULTIVITAMIN) tablet Take 1 tablet by mouth daily.      [provider]  PHENObarbital (LUMINAL) 60 MG tablet TAKE 1 TABLET BY MOUTH EVERY DAY 09/20/19   Crecencio Mc, MD  Probiotic Product (SOLUBLE FIBER/PROBIOTICS PO) Take 1 capsule by mouth daily.    [provider]  rizatriptan (MAXALT) 10 MG tablet TAKE 1 TABLET BY MOUTH AS NEEDED FOR MIGRAINE. MAY REPEAT IN 2 HOURS IF NEEDED 12/11/18   Crecencio Mc, MD  umeclidinium-vilanterol [facility] ELLIPTA) 62.5-25 MCG/INH AEPB  11/20/19   [provider]  vitamin B-12 (CYANOCOBALAMIN) 1000 MCG tablet Take 1,000 mcg by mouth daily.     [provider]  zolpidem (AMBIEN) 10 MG tablet Take 1 tablet (10 mg total) by mouth at bedtime as needed for sleep. 02/12/19   Crecencio Mc, MD    Allergies    Penicillins  Review of Systems   Review of Systems  Constitutional: Positive for diaphoresis. Negative for fever.  HENT: Negative for sore throat.   Eyes: Negative for visual disturbance.  Respiratory: Negative for cough and shortness of breath.   Cardiovascular: Positive  for chest pain. Negative for leg swelling.  Gastrointestinal: Negative for abdominal pain, nausea and vomiting.  Genitourinary: Negative for dysuria.  Musculoskeletal: Negative for back pain.  Skin: Negative for rash.  Neurological: Negative for headaches.    Physical Exam Updated Vital Signs BP (!) 166/75   Pulse 70   Temp 98.1 F (36.7 C) (Oral)   Resp 18   Ht 5' 5.75" (1.67 m)   Wt 61.9 kg   SpO2 99%   BMI 22.19 kg/m   Physical Exam Vitals and nursing note reviewed.  Constitutional:      General: She is not in acute distress.    Appearance: She is well-developed.  HENT:     Head: Normocephalic and atraumatic.  Eyes:     Conjunctiva/sclera: Conjunctivae normal.  Cardiovascular:     Rate and Rhythm: Normal rate and regular rhythm.     Heart sounds: Normal heart sounds. No murmur heard.   Pulmonary:     Effort: Pulmonary effort is normal. No respiratory distress.     Breath sounds: Normal breath sounds.  Abdominal:     Palpations: Abdomen is soft.     Tenderness: There is no abdominal tenderness.  Musculoskeletal:        General: Normal range of motion.     Cervical back: Neck supple.     Right lower leg: No tenderness. No edema.     Left lower leg: No tenderness. No edema.  Skin:    General: Skin is warm and dry.     Capillary Refill: Capillary refill takes less than 2 seconds.  Neurological:     General: No focal deficit present.     Mental Status: She is alert.     ED Results / Procedures / Treatments    Labs (all labs ordered are listed, but only abnormal results are displayed) Labs Reviewed  BASIC METABOLIC PANEL - Abnormal; Notable for the following components:      Result Value   Potassium 2.9 (*)    Glucose, Bld 114 (*)    All other components within normal limits  D-DIMER, QUANTITATIVE (NOT AT [facility]) - Abnormal; Notable for the following components:   D-Dimer, Quant 0.72 (*)    All other components within normal limits  CBC  TROPONIN I (HIGH SENSITIVITY)  TROPONIN I (HIGH SENSITIVITY)    EKG None  Radiology DG Chest 2 View  Result Date: 12/20/2019 CLINICAL DATA:  Chest pain x2 weeks, recent air travel. EXAM: CHEST - 2 VIEW COMPARISON:  Chest x-ray 09/29/2017. CT chest 02/06/2018 FINDINGS: The heart size and mediastinal contours are unchanged. Hyperinflated lungs. Biapical pleural/pulmonary scarring. Lateral view demonstrates similar-appearing atelectasis/scarring along the minor fissure (better evaluated on CT 09/29/2017). No focal consolidation. No pulmonary edema. Blunting of bilateral costophrenic angles which may represent scarring versus trace pleural effusions. No pneumothorax. No acute osseous abnormality. IMPRESSION: No active cardiopulmonary disease with persistent atelectasis/scarring along the minor fissure noted on lateral view. Electronically Signed   By: Iven Finn M.D.   On: 12/20/2019 16:34    Procedures Procedures (including critical care time)  Medications Ordered in ED Medications  aspirin chewable tablet 324 mg (324 mg Oral Given 12/20/19 1953)  iohexol (OMNIPAQUE) 350 MG/ML injection 100 mL (100 mLs Intravenous Contrast Given 12/20/19 1806)  potassium chloride SA (KLOR-CON) CR tablet 40 mEq (40 mEq Oral Given 12/20/19 2118)    ED Course  I have reviewed the triage vital signs and the nursing notes.  Pertinent labs & imaging results that  were available during my care of the patient were reviewed by me and considered in my medical decision making (see  chart for details).  Clinical Course as of Dec 20 1113  Fri Dec 20, 2019  2110 Reviewed results with patient.  She states she does not want to be admitted and feels she can follow this up with her primary care doctor.  She does not want any pain medicine.  We will give her some oral potassium supplementation.  Return instructions discussed.   [MB]    Clinical Course User Index [MB] Hayden Rasmussen, MD   MDM Rules/Calculators/A&P HEAR Score: 3                       This patient complains of left-sided chest pain; this involves an extensive number of treatment Options and is a complaint that carries with it a high risk of complications and Morbidity. The differential includes ACS, PE, vascular, musculoskeletal, pneumonia, pneumothorax, GERD  I ordered, reviewed and interpreted labs, which included CBC with normal white count normal hemoglobin, chemistries normal other than a low potassium, D-dimer elevated, delta troponins unchanged  I ordered medication aspirin and oral potassium repletion I ordered imaging studies which included chest x-ray and CT angio chest and I independently    visualized and interpreted imaging which showed some atelectasis changes Additional history obtained from patient's husband Previous records obtained and reviewed in epic, no recent cardiac work-ups  After the interventions stated above, I reevaluated the patient and found patient to be resting comfortably sats 100% on room air.  No evidence of any significant findings in her work-up so far.  She would like to go home and follow-up with her doctor.  I think this is reasonable with her current work-up and low Here score.  She would rather her primary care doctor set her up with a cardiologist as she lives over in [place].  Return instructions discussed.   Final Clinical Impression(s) / ED Diagnoses Final diagnoses:  Nonspecific chest pain  Hypokalemia    Rx / DC Orders ED Discharge Orders    None        Hayden Rasmussen, MD 12/21/19 1119

## 2019-12-20 NOTE — ED Triage Notes (Signed)
She was told to come here after being seen at [facility] in [place] for chest pain.

## 2019-12-20 NOTE — Telephone Encounter (Signed)
PATIENT contacted me via cell phone text.  I advised her that she needs to be evaluated with chest x ray lipase,  CMEt and EKg  . She is waiting to be seen at urgent care

## 2019-12-23 ENCOUNTER — Telehealth: Payer: Self-pay | Admitting: Internal Medicine

## 2019-12-23 NOTE — Telephone Encounter (Signed)
Patient called in stated she went to the ED the weekend and they suggest that she go to a cardiologist she need a referral to a cardiologist

## 2019-12-23 NOTE — Telephone Encounter (Signed)
Spoke with pt and she is scheduled for an in office appt tomorrow at 3:30 per Dr. Derrel Nip.

## 2019-12-23 NOTE — Telephone Encounter (Signed)
PT called back to see if the referral had been placed to cardiology  Pt stated that she is still having chest pain and refused to go to the triage nurse or to the ED Because she went to the ED on 12/20/19

## 2019-12-23 NOTE — Telephone Encounter (Signed)
Please schedule [facility] in the 3:30 slot tomorrow.   It needs to be in person

## 2019-12-24 ENCOUNTER — Encounter: Payer: Self-pay | Admitting: Internal Medicine

## 2019-12-24 ENCOUNTER — Other Ambulatory Visit: Payer: Self-pay

## 2019-12-24 ENCOUNTER — Ambulatory Visit (INDEPENDENT_AMBULATORY_CARE_PROVIDER_SITE_OTHER): Payer: Medicare Other | Admitting: Internal Medicine

## 2019-12-24 VITALS — BP 152/84 | HR 73 | Temp 98.1°F | Resp 15 | Ht 65.75 in | Wt 138.2 lb

## 2019-12-24 DIAGNOSIS — E876 Hypokalemia: Secondary | ICD-10-CM

## 2019-12-24 DIAGNOSIS — I700 Atherosclerosis of aorta: Secondary | ICD-10-CM | POA: Diagnosis not present

## 2019-12-24 DIAGNOSIS — R0789 Other chest pain: Secondary | ICD-10-CM

## 2019-12-24 DIAGNOSIS — E1169 Type 2 diabetes mellitus with other specified complication: Secondary | ICD-10-CM | POA: Diagnosis not present

## 2019-12-24 DIAGNOSIS — E785 Hyperlipidemia, unspecified: Secondary | ICD-10-CM | POA: Diagnosis not present

## 2019-12-24 MED ORDER — METOPROLOL SUCCINATE ER 25 MG PO TB24: 25.0000 mg | ORAL_TABLET | Freq: Every day | ORAL | 3 refills | Status: DC

## 2019-12-24 MED ORDER — ATORVASTATIN CALCIUM 20 MG PO TABS: 20.0000 mg | ORAL_TABLET | Freq: Every day | ORAL | 3 refills | Status: DC

## 2019-12-24 MED ORDER — PREDNISONE 10 MG PO TABS: ORAL_TABLET | ORAL | 0 refills | Status: DC

## 2019-12-24 NOTE — Patient Instructions (Signed)
3 new meds:  Toprol XL 25 mg take daily after dinner  atrovastatin 20 mg take daily after dinner   Prednisone taper  Start tomorrow morning (for costochondritis)   Referral to Dr End in process

## 2019-12-24 NOTE — Progress Notes (Signed)
Subjective:  Patient ID: Barbara Odom, female    DOB: [birth year]  Age: 69 y.o. MRN: [id number]  CC: The primary encounter diagnosis was Hypokalemia. Diagnoses of Hyperlipidemia associated with type 2 diabetes mellitus ([place]), Aortic atherosclerosis ([place]), and Atypical chest pain were also pertinent to this visit.  HPI Barbara Odom presents for  ER FOLLOW UP FOR CHEST PAIN .  ER visit occurred on October 1  This visit occurred during the SARS-CoV-2 public health emergency.  Safety protocols were in place, including screening questions prior to the visit, additional usage of staff PPE, and extensive cleaning of exam room while observing appropriate contact time as indicated for disinfecting solutions.   HPI:  First episode of chest pain lasted ten minutes and occurred on Sept 10 while visiting daughter in [place] running up and down the stairs   Lasted 10 minutes. Several previous episodes had occurred in August while walking on the treadmill .  Since Sept 10 the pain has waxed and waned but never completely resolved.  Most recent escalation occurred last week on Wednesday, and became more intense over the next 48 hours. She has chronic back pain and had an ESI of L4-5  S1 on Monday prior to pain onset . It is present at rest.  She notes a place on her chest wall  That is tender .   Treated in ED for chest pain  That had been persistent for several hours  And increased in intensity On October 1.  D Dimer slightly up at 0.72,  CT angio done,  No PE.  EKG  With small q waves in leads II, III and AVF.  Troponin normal. Declined admission for futher workup. Aortic atherosclerosis noted on CT  Angiogram.  Right middle lobe and left upper lobe ATX   Outpatient Medications Prior to Visit  Medication Sig Dispense Refill  . Ascorbic Acid (VITAMIN C) 1000 MG tablet Take 1,000 mg by mouth daily.    . Biotin 5000 MCG CAPS Take 1 capsule by mouth. Daily    . cetirizine (ZYRTEC) 10 MG tablet Take 10 mg by  mouth daily.    . Cranberry-Vitamin C (CRANBERRY CONCENTRATE/VITAMINC PO) Take 4,200 mg by mouth daily.    . diazepam (VALIUM) 5 MG tablet Take 5 mg by mouth every 8 (eight) hours as needed.    Marland Kitchen GLUCOSAMINE-CHONDROITIN PO Take 1 tablet by mouth daily.    . Magnesium 400 MG CAPS Take 1 capsule by mouth daily.    . Multiple Vitamin (MULTIVITAMIN) tablet Take 1 tablet by mouth daily.      Marland Kitchen PHENObarbital (LUMINAL) 60 MG tablet TAKE 1 TABLET BY MOUTH EVERY DAY 90 tablet 3  . Probiotic Product (SOLUBLE FIBER/PROBIOTICS PO) Take 1 capsule by mouth daily.    . rizatriptan (MAXALT) 10 MG tablet TAKE 1 TABLET BY MOUTH AS NEEDED FOR MIGRAINE. MAY REPEAT IN 2 HOURS IF NEEDED 18 tablet 6  . umeclidinium-vilanterol (ANORO ELLIPTA) 62.5-25 MCG/INH AEPB     . vitamin B-12 (CYANOCOBALAMIN) 1000 MCG tablet Take 1,000 mcg by mouth daily.    Marland Kitchen zolpidem (AMBIEN) 10 MG tablet Take 1 tablet (10 mg total) by mouth at bedtime as needed for sleep. 30 tablet 5  . Fexofenadine HCl (ALLEGRA ALLERGY PO) Take by mouth.     No facility-administered medications prior to visit.    Review of Systems;  Patient denies headache, fevers, malaise, unintentional weight loss, skin rash, eye pain, sinus congestion and sinus pain, sore throat,  dysphagia,  hemoptysis , cough, dyspnea, wheezing, chest pain, palpitations, orthopnea, edema, abdominal pain, nausea, melena, diarrhea, constipation, flank pain, dysuria, hematuria, urinary  Frequency, nocturia, numbness, tingling, seizures,  Focal weakness, Loss of consciousness,  Tremor, insomnia, depression, anxiety, and suicidal ideation.      Objective:  BP (!) 152/84 (BP Location: Left Arm, Patient Position: Sitting, Cuff Size: Normal)   Pulse 73   Temp 98.1 F (36.7 C) (Oral)   Resp 15   Ht 5' 5.75" (1.67 m)   Wt 138 lb 3.2 oz (62.7 kg)   SpO2 99%   BMI 22.48 kg/m   BP Readings from Last 3 Encounters:  12/24/19 (!) 152/84  12/20/19 (!) 147/79  12/20/19 (!) 183/89    Wt  Readings from Last 3 Encounters:  12/24/19 138 lb 3.2 oz (62.7 kg)  12/20/19 136 lb 7.4 oz (61.9 kg)  09/04/19 136 lb 6.4 oz (61.9 kg)    General appearance: alert, cooperative and appears stated age Ears: normal TM's and external ear canals both ears Throat: lips, mucosa, and tongue normal; teeth and gums normal Neck: no adenopathy, no carotid bruit, supple, symmetrical, trachea midline and thyroid not enlarged, symmetric, no tenderness/mass/nodules Back: symmetric, no curvature. ROM normal. No CVA tenderness. Chest wall: tenderness on chest wall , aggravated by resisted adduction.  Lungs: clear to auscultation bilaterally Heart: regular rate and rhythm, S1, S2 normal, no murmur, click, rub or gallop Abdomen: soft, non-tender; bowel sounds normal; no masses,  no organomegaly Pulses: 2+ and symmetric Skin: Skin color, texture, turgor normal. No rashes or lesions Lymph nodes: Cervical, supraclavicular, and axillary nodes normal.  No results found for: HGBA1C  Lab Results  Component Value Date   CREATININE 0.70 12/20/2019   CREATININE 0.80 02/26/2019   CREATININE 0.74 03/09/2018    Lab Results  Component Value Date   WBC 6.6 12/20/2019   HGB 13.6 12/20/2019   HCT 40.7 12/20/2019   PLT 311 12/20/2019   GLUCOSE 114 (H) 12/20/2019   CHOL 252 (H) 02/26/2019   TRIG 61.0 02/26/2019   HDL 88.80 02/26/2019   LDLDIRECT 114.0 12/14/2016   LDLCALC 151 (H) 02/26/2019   ALT 12 02/26/2019   AST 17 02/26/2019   NA 136 12/20/2019   K 2.9 (L) 12/20/2019   CL 99 12/20/2019   CREATININE 0.70 12/20/2019   BUN 14 12/20/2019   CO2 27 12/20/2019   TSH 3.83 02/26/2019    DG Chest 2 View  Result Date: 12/20/2019 CLINICAL DATA:  Chest pain x2 weeks, recent air travel. EXAM: CHEST - 2 VIEW COMPARISON:  Chest x-ray 09/29/2017. CT chest 02/06/2018 FINDINGS: The heart size and mediastinal contours are unchanged. Hyperinflated lungs. Biapical pleural/pulmonary scarring. Lateral view demonstrates  similar-appearing atelectasis/scarring along the minor fissure (better evaluated on CT 09/29/2017). No focal consolidation. No pulmonary edema. Blunting of bilateral costophrenic angles which may represent scarring versus trace pleural effusions. No pneumothorax. No acute osseous abnormality. IMPRESSION: No active cardiopulmonary disease with persistent atelectasis/scarring along the minor fissure noted on lateral view. Electronically Signed   By: Iven Finn M.D.   On: 12/20/2019 16:34   CT ANGIO CHEST PE W OR WO CONTRAST  Result Date: 12/20/2019 CLINICAL DATA:  Chest pain. EXAM: CT ANGIOGRAPHY CHEST WITH CONTRAST TECHNIQUE: Multidetector CT imaging of the chest was performed using the standard protocol during bolus administration of intravenous contrast. Multiplanar CT image reconstructions and MIPs were obtained to evaluate the vascular anatomy. CONTRAST:  163mL OMNIPAQUE IOHEXOL 350 MG/ML SOLN COMPARISON:  February 06, 2018 FINDINGS: Cardiovascular: There is mild calcification of the aortic arch. Satisfactory opacification of the pulmonary arteries to the segmental level. No evidence of pulmonary embolism. Normal heart size. No pericardial effusion. Mediastinum/Nodes: No enlarged mediastinal, hilar, or axillary lymph nodes. Thyroid gland, trachea, and esophagus demonstrate no significant findings. Lungs/Pleura: Mild atelectasis is seen along the anteromedial aspect of the right middle lobe and left upper lobe. There is no evidence of a pleural effusion or pneumothorax. Upper Abdomen: No acute abnormality. Musculoskeletal: No chest wall abnormality. No acute or significant osseous findings. Review of the MIP images confirms the above findings. IMPRESSION: 1. No CT evidence of pulmonary embolism. 2. Mild right middle lobe and left upper lobe atelectasis. 3. Aortic atherosclerosis. Aortic Atherosclerosis (ICD10-I70.0). Electronically Signed   By: Virgina Norfolk M.D.   On: 12/20/2019 20:46     Assessment & Plan:   Problem List Items Addressed This Visit      Unprioritized   Aortic atherosclerosis ([place])   Relevant Medications   metoprolol succinate (TOPROL-XL) 25 MG 24 hr tablet   atorvastatin (LIPITOR) 20 MG tablet   Other Relevant Orders   Lipid panel   Atypical chest pain    There is no evidence of ischemia or PE  By recent ER workup,  But she does have atherosclerosis of the thoracic aorta. She notes that her heart pounds when she is lying down.  Metoprolol prescribed along with statin.  Treating for costochondritis with prednisone taper.  Referral to Cardiology for risk stratification        Relevant Orders   Ambulatory referral to Cardiology    Other Visit Diagnoses    Hypokalemia    -  Primary   Relevant Orders   Basic metabolic panel   Magnesium   Hyperlipidemia associated with type 2 diabetes mellitus ([place])       Relevant Medications   atorvastatin (LIPITOR) 20 MG tablet      I have discontinued Adrien A. Domingos "Chris"'s Fexofenadine HCl (ALLEGRA ALLERGY PO). I am also having her start on metoprolol succinate, atorvastatin, and predniSONE. Additionally, I am having her maintain her multivitamin, Probiotic Product (SOLUBLE FIBER/PROBIOTICS PO), diazepam, rizatriptan, Magnesium, Cranberry-Vitamin C (CRANBERRY CONCENTRATE/VITAMINC PO), vitamin B-12, Biotin, GLUCOSAMINE-CHONDROITIN PO, zolpidem, cetirizine, vitamin C, PHENObarbital, and Anoro Ellipta.  Meds ordered this encounter  Medications  . metoprolol succinate (TOPROL-XL) 25 MG 24 hr tablet    Sig: Take 1 tablet (25 mg total) by mouth daily.    Dispense:  90 tablet    Refill:  3  . atorvastatin (LIPITOR) 20 MG tablet    Sig: Take 1 tablet (20 mg total) by mouth daily.    Dispense:  90 tablet    Refill:  3  . predniSONE (DELTASONE) 10 MG tablet    Sig: 6 tablets on Day 1 , then reduce by 1 tablet daily until gone    Dispense:  21 tablet    Refill:  0    Medications Discontinued During This  Encounter  Medication Reason  . Fexofenadine HCl (ALLEGRA ALLERGY PO)    A total of 40 minutes was spent with patient more than half of which was spent in counseling patient on the above mentioned issues , reviewing and explaining recent labs and imaging studies done, and coordination of care.  Follow-up: No follow-ups on file.   Crecencio Mc, MD

## 2019-12-24 NOTE — Assessment & Plan Note (Signed)
There is no evidence of ischemia or PE  By recent ER workup,  But she does have atherosclerosis of the thoracic aorta. She notes that her heart pounds when she is lying down.  Metoprolol prescribed along with statin.  Treating for costochondritis with prednisone taper.  Referral to Cardiology for risk stratification

## 2019-12-25 LAB — LIPID PANEL
Cholesterol: 256 mg/dL — ABNORMAL HIGH (ref 0–200)
HDL: 75.8 mg/dL (ref >39.00)
LDL Cholesterol: 166 mg/dL — ABNORMAL HIGH (ref 0–99)
NonHDL: 179.86
Total CHOL/HDL Ratio: 3
Triglycerides: 69 mg/dL (ref 0.0–149.0)
VLDL: 13.8 mg/dL (ref 0.0–40.0)

## 2019-12-25 LAB — BASIC METABOLIC PANEL
BUN: 17 mg/dL (ref 6–23)
CO2: 29 mEq/L (ref 19–32)
Calcium: 9.6 mg/dL (ref 8.4–10.5)
Chloride: 98 mEq/L (ref 96–112)
Creatinine, Ser: 0.84 mg/dL (ref 0.40–1.20)
GFR: 71.79 mL/min (ref >60.00)
Glucose, Bld: 97 mg/dL (ref 70–99)
Potassium: 4 mEq/L (ref 3.5–5.1)
Sodium: 136 mEq/L (ref 135–145)

## 2019-12-25 LAB — MAGNESIUM: Magnesium: 2 mg/dL (ref 1.5–2.5)

## 2019-12-27 ENCOUNTER — Ambulatory Visit: Payer: Medicare Other | Admitting: Cardiology

## 2019-12-30 ENCOUNTER — Ambulatory Visit: Payer: Medicare Other

## 2020-01-08 ENCOUNTER — Ambulatory Visit (INDEPENDENT_AMBULATORY_CARE_PROVIDER_SITE_OTHER): Payer: Medicare Other

## 2020-01-08 VITALS — Ht 66.5 in | Wt 135.8 lb

## 2020-01-08 DIAGNOSIS — Z0000 Encounter for general adult medical examination without abnormal findings: Secondary | ICD-10-CM

## 2020-01-08 NOTE — Progress Notes (Addendum)
Subjective:   Barbara Odom is a 69 y.o. female who presents for Medicare Annual (Subsequent) preventive examination.  Review of Systems    No ROS.  Medicare Wellness Virtual Visit.  Cardiac Risk Factors include: advanced age (>2men, >65 women)     Objective:    Today's Vitals   01/08/20 1313  Weight: 135 lb 12.8 oz (61.6 kg)  Height: 5' 6.5" (1.689 m)   Body mass index is 21.59 kg/m.  Advanced Directives 01/08/2020 12/20/2019 01/07/2019 09/29/2017 10/07/2016  Does Patient Have a Medical Advance Directive? Yes Yes Yes No No  Type of Paramedic of [place];Living will [place];Living will [place];Living will - -  Does patient want to make changes to medical advance directive? - - No - Patient declined - -  Copy of [place] in Chart? No - copy requested - No - copy requested - -  Would patient like information on creating a medical advance directive? - - - - No - Patient declined    Current Medications (verified) Outpatient Encounter Medications as of 01/08/2020  Medication Sig   aspirin EC 81 MG tablet Take 81 mg by mouth daily. Swallow whole.   atorvastatin (LIPITOR) 20 MG tablet Take 1 tablet (20 mg total) by mouth daily.   Biotin 5000 MCG CAPS Take 1 capsule by mouth. Daily   cetirizine (ZYRTEC) 10 MG tablet Take 10 mg by mouth daily.   Cholecalciferol (VITAMIN D-3 PO) Take 2,000 Units by mouth.   Cranberry-Vitamin C (CRANBERRY CONCENTRATE/VITAMINC PO) Take 4,200 mg by mouth daily.   diazepam (VALIUM) 5 MG tablet Take 5 mg by mouth every 8 (eight) hours as needed.   GLUCOSAMINE-CHONDROITIN PO Take 1 tablet by mouth daily.   Magnesium 400 MG CAPS Take 1 capsule by mouth daily.   metoprolol succinate (TOPROL-XL) 25 MG 24 hr tablet Take 1 tablet (25 mg total) by mouth daily.   Multiple Vitamin (MULTIVITAMIN) tablet Take 1 tablet by mouth daily.     Multiple Vitamins-Minerals  (PRESERVISION AREDS 2 PO) Take 1 capsule by mouth.   PHENObarbital (LUMINAL) 60 MG tablet TAKE 1 TABLET BY MOUTH EVERY DAY   Probiotic Product (SOLUBLE FIBER/PROBIOTICS PO) Take 1 capsule by mouth daily.   rizatriptan (MAXALT) 10 MG tablet TAKE 1 TABLET BY MOUTH AS NEEDED FOR MIGRAINE. MAY REPEAT IN 2 HOURS IF NEEDED   umeclidinium-vilanterol (ANORO ELLIPTA) 62.5-25 MCG/INH AEPB    vitamin B-12 (CYANOCOBALAMIN) 1000 MCG tablet Take 1,000 mcg by mouth daily.   zolpidem (AMBIEN) 10 MG tablet Take 1 tablet (10 mg total) by mouth at bedtime as needed for sleep.   [DISCONTINUED] Ascorbic Acid (VITAMIN C) 1000 MG tablet Take 1,000 mg by mouth daily.   [DISCONTINUED] Multiple Vitamins-Minerals (PRESERVISION AREDS 2) CAPS Take 1 capsule by mouth.   [DISCONTINUED] predniSONE (DELTASONE) 10 MG tablet 6 tablets on Day 1 , then reduce by 1 tablet daily until gone   No facility-administered encounter medications on file as of 01/08/2020.    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   BREAST SURGERY     lumpectomy, benign    CARPAL TUNNEL RELEASE  1983   bilateral    CERVICAL DISCECTOMY  August 2012   [place]   Family History  Problem Relation Age of Onset   Heart disease Mother        Atrial fibrillation   Coronary artery disease Mother    Cancer Maternal Grandmother        colon Ca   Cancer Maternal Grandfather        Colon CA   Macular degeneration Father     Congestive Heart Failure Father    Social History   Socioeconomic History   Marital status: Married    Spouse name: Not on file   Number of children: Not on file   Years of education: Not on file   Highest education level: Not on file  Occupational History   Occupation: BEHAVIOR MED    Employer: [employer]  Tobacco Use   Smoking status: Never Smoker   Smokeless tobacco: Never Used  Vaping Use   Vaping Use: Never used  Substance and Sexual Activity   Alcohol use: Yes    Comment: socially    Drug use: No   Sexual activity: Yes    Partners: Male    Comment: 1st intercourse- 57, partners- 29, married- 103 yrs   Other Topics Concern   Not on file  Social History Narrative   Not on file   Social Determinants of Health   Financial Resource Strain: Low Risk    Difficulty of Paying Living Expenses: Not hard at all  Food Insecurity: No Food Insecurity   Worried About Charity fundraiser in the Last Year: Never true   Arboriculturist in the Last Year: Never true  Transportation Needs: No Transportation Needs   Lack of Transportation (Medical): No   Lack of Transportation (Non-Medical): No  Physical Activity: Sufficiently Active   Days of Exercise per Week: 4 days   Minutes of Exercise per Session: 60 min  Stress: No Stress Concern Present   Feeling of Stress : Not at all  Social Connections: Unknown   Frequency of Communication with Friends and Family: Not on file   Frequency of Social Gatherings with Friends and Family: Not on file   Attends Religious Services: Not on Electrical engineer or Organizations: Not on file   Attends Archivist Meetings: Not on file   Marital Status: Married    Tobacco Counseling Counseling given: Not Answered   Clinical Intake:  Pre-visit preparation completed: Yes        Diabetes: No  How often do you need to have someone help you when you read instructions, pamphlets, or other written  materials from your doctor or pharmacy?: 1 - Never Interpreter Needed?: No      Activities of Daily Living In your present state of health, do you have any difficulty performing the following activities: 01/08/2020  Hearing? N  Vision? N  Difficulty concentrating or making decisions? N  Walking or climbing stairs? N  Dressing or bathing? N  Doing errands, shopping? N  Preparing Food and eating ? N  Using the Toilet? N  In the past six months, have you accidently leaked  urine? N  Do you have problems with loss of bowel control? N  Managing your Medications? N  Managing your Finances? N  Housekeeping or managing your Housekeeping? N  Some recent data might be hidden    Patient Care Team: Crecencio Mc, MD as PCP - General (Internal Medicine)  Indicate any recent Medical Services you may have received from other than [facility] providers in the past year (date may be approximate).     Assessment:   This is a routine wellness examination for [place].  I connected with Barbara Odom today by telephone and verified that I am speaking with the correct person using two identifiers. Location patient: home Location provider: work Persons participating in the virtual visit: patient, Marine scientist.    I discussed the limitations, risks, security and privacy concerns of performing an evaluation and management service by telephone and the availability of in person appointments. The patient expressed understanding and verbally consented to this telephonic visit.    Interactive audio and video telecommunications were attempted between this provider and patient, however failed, due to patient having technical difficulties OR patient did not have access to video capability.  We continued and completed visit with audio only.  Some vital signs may be absent or patient reported.   Hearing/Vision screen  Hearing Screening   125Hz  250Hz  500Hz  1000Hz  2000Hz  3000Hz  4000Hz  6000Hz  8000Hz   Right ear:            Left ear:           Comments: Patient is able to hear conversational tones without difficulty. No issues reported.  Vision Screening Comments: [facility]  Visual acuity not assessed, virtual visit.  Dietary issues and exercise activities discussed: Current Exercise Habits: Home exercise routine;Structured exercise class, Type of exercise: walking;yoga;calisthenics, Time (Minutes): 60, Frequency (Times/Week): 4, Weekly Exercise (Minutes/Week): 240, Intensity: Moderate  Healthy diet  Good water intake  Goals      Follow up with primary care provider     As needed       Depression Screen PHQ 2/9 Scores 01/08/2020 07/09/2019 06/13/2019 01/07/2019 11/16/2014 03/24/2014  PHQ - 2 Score 0 0 0 0 0 0  Some encounter information is confidential and restricted. Go to Review Flowsheets activity to see all data.    Fall Risk Fall Risk  01/08/2020 12/24/2019 07/15/2019 07/09/2019 06/13/2019  Falls in the past year? 0 0 0 0 0  Number falls in past yr: 0 - - 0 0  Injury with Fall? - - - 0 -  Follow up Falls evaluation completed Falls evaluation completed Falls evaluation completed Falls evaluation completed Falls evaluation completed   Handrails in use when climbing stairs? Yes Home free of loose throw rugs in walkways, pet beds, electrical cords, etc? Yes  Adequate lighting in your home to reduce risk of falls? Yes   ASSISTIVE DEVICES UTILIZED TO PREVENT FALLS: Use of a cane, Holst or w/c? No   TIMED UP AND GO: Was the test performed? No . Virtual visit.   Cognitive Function:  Patient is alert and oriented x3.  Denies difficulty focusing, making decisions, memory loss.  Enjoys reading and completing puzzles for brain health stimulation.    6CIT Screen 01/07/2019  What Year? 0 points  What month? 0 points  What time? 0 points  Count back from 20 0 points  Months in reverse 0 points  Repeat phrase 0 points  Total Score 0   Immunizations Immunization History   Administered Date(s) Administered  Fluad Quad(high Dose 65+) 01/02/2019   Influenza Split 02/05/2012, 12/19/2012   PFIZER SARS-COV-2 Vaccination 05/03/2019, 05/28/2019   Pneumococcal Conjugate-13 02/26/2019   Tdap 01/23/2013   Zoster Recombinat (Shingrix) 01/29/2019   Health Maintenance Health Maintenance  Topic Date Due   URINE MICROALBUMIN  Never done   MAMMOGRAM  11/12/2019   INFLUENZA VACCINE  06/18/2020 (Originally 10/20/2019)   PNA vac Low Risk Adult (2 of 2 - PPSV23) 02/26/2020   COLONOSCOPY  03/06/2022   TETANUS/TDAP  01/24/2023   DEXA SCAN  Completed   COVID-19 Vaccine  Completed   Hepatitis C Screening  Completed   Dental Screening: Recommended annual dental exams for proper oral hygiene.  Community Resource Referral / Chronic Care Management: CRR required this visit?  No   CCM required this visit?  No      Plan:   Keep all routine maintenance appointments.   Follow up flu shot 01/10/20 @ 10:00  Follow up 02/18/20 @ 1:00  I have personally reviewed and noted the following in the patient's chart:   Medical and social history Use of alcohol, tobacco or illicit drugs  Current medications and supplements Functional ability and status Nutritional status Physical activity Advanced directives List of other physicians Hospitalizations, surgeries, and ER visits in previous 12 months Vitals Screenings to include cognitive, depression, and falls Referrals and appointments  In addition, I have reviewed and discussed with patient certain preventive protocols, quality metrics, and best practice recommendations. A written personalized care plan for preventive services as well as general preventive health recommendations were provided to patient via mychart.     OBrien-Blaney, Esma Kilts L, LPN   [id number]     I have reviewed the above information and agree with above.   Deborra Medina, MD

## 2020-01-08 NOTE — Patient Instructions (Addendum)
Barbara Odom , Thank you for taking time to come for your Medicare Wellness Visit. I appreciate your ongoing commitment to your health goals. Please review the following plan we discussed and let me know if I can assist you in the future.   These are the goals we discussed: Goals    . Follow up with primary care provider     As needed       This is a list of the screening recommended for you and due dates:  Health Maintenance  Topic Date Due  . Urine Protein Check  Never done  . Mammogram  11/12/2019  . Flu Shot  06/18/2020*  . Pneumonia vaccines (2 of 2 - PPSV23) 02/26/2020  . Colon Cancer Screening  03/06/2022  . Tetanus Vaccine  01/24/2023  . DEXA scan (bone density measurement)  Completed  . COVID-19 Vaccine  Completed  .  Hepatitis C: One time screening is recommended by Center for Disease Control  (CDC) for  adults born from [birth year] through 1963.   Completed  *Topic was postponed. The date shown is not the original due date.    Immunizations Immunization History  Administered Date(s) Administered  . Fluad Quad(high Dose 65+) 01/02/2019  . Influenza Split 02/05/2012, 12/19/2012  . PFIZER SARS-COV-2 Vaccination 05/03/2019, 05/28/2019  . Pneumococcal Conjugate-13 02/26/2019  . Tdap 01/23/2013  . Zoster Recombinat (Shingrix) 01/29/2019   Keep all routine maintenance appointments.   Follow up flu shot 01/10/20 @ 10:00  Follow up 02/18/20 @ 1:00  Advanced directives: End of life planning; Advance aging; Advanced directives discussed.  Copy of current HCPOA/Living Will requested.    Conditions/risks identified: none new  Follow up in one year for your annual wellness visit.   Preventive Care 40 Years and Older, Female Preventive care refers to lifestyle choices and visits with your health care provider that can promote health and wellness. What does preventive care include?  A yearly physical exam. This is also called an annual well check.  Dental exams once or twice  a year.  Routine eye exams. Ask your health care provider how often you should have your eyes checked.  Personal lifestyle choices, including:  Daily care of your teeth and gums.  Regular physical activity.  Eating a healthy diet.  Avoiding tobacco and drug use.  Limiting alcohol use.  Practicing safe sex.  Taking low-dose aspirin every day.  Taking vitamin and mineral supplements as recommended by your health care provider. What happens during an annual well check? The services and screenings done by your health care provider during your annual well check will depend on your age, overall health, lifestyle risk factors, and family history of disease. Counseling  Your health care provider may ask you questions about your:  Alcohol use.  Tobacco use.  Drug use.  Emotional well-being.  Home and relationship well-being.  Sexual activity.  Eating habits.  History of falls.  Memory and ability to understand (cognition).  Work and work Statistician.  Reproductive health. Screening  You may have the following tests or measurements:  Height, weight, and BMI.  Blood pressure.  Lipid and cholesterol levels. These may be checked every 5 years, or more frequently if you are over 49 years old.  Skin check.  Lung cancer screening. You may have this screening every year starting at age 64 if you have a 30-pack-year history of smoking and currently smoke or have quit within the past 15 years.  Fecal occult blood test (FOBT) of the stool.  You may have this test every year starting at age 88.  Flexible sigmoidoscopy or colonoscopy. You may have a sigmoidoscopy every 5 years or a colonoscopy every 10 years starting at age 39.  Hepatitis C blood test.  Hepatitis B blood test.  Sexually transmitted disease (STD) testing.  Diabetes screening. This is done by checking your blood sugar (glucose) after you have not eaten for a while (fasting). You may have this done every  1-3 years.  Bone density scan. This is done to screen for osteoporosis. You may have this done starting at age 66.  Mammogram. This may be done every 1-2 years. Talk to your health care provider about how often you should have regular mammograms. Talk with your health care provider about your test results, treatment options, and if necessary, the need for more tests. Vaccines  Your health care provider may recommend certain vaccines, such as:  Influenza vaccine. This is recommended every year.  Tetanus, diphtheria, and acellular pertussis (Tdap, Td) vaccine. You may need a Td booster every 10 years.  Zoster vaccine. You may need this after age 100.  Pneumococcal 13-valent conjugate (PCV13) vaccine. One dose is recommended after age 74.  Pneumococcal polysaccharide (PPSV23) vaccine. One dose is recommended after age 33. Talk to your health care provider about which screenings and vaccines you need and how often you need them. This information is not intended to replace advice given to you by your health care provider. Make sure you discuss any questions you have with your health care provider. Document Released: 04/03/2015 Document Revised: 11/25/2015 Document Reviewed: 01/06/2015 Elsevier Interactive Patient Education  2017 [place] Prevention in the Home Falls can cause injuries. They can happen to people of all ages. There are many things you can do to make your home safe and to help prevent falls. What can I do on the outside of my home?  Regularly fix the edges of walkways and driveways and fix any cracks.  Remove anything that might make you trip as you walk through a door, such as a raised step or threshold.  Trim any bushes or trees on the path to your home.  Use bright outdoor lighting.  Clear any walking paths of anything that might make someone trip, such as rocks or tools.  Regularly check to see if handrails are loose or broken. Make sure that both sides of  any steps have handrails.  Any raised decks and porches should have guardrails on the edges.  Have any leaves, snow, or ice cleared regularly.  Use sand or salt on walking paths during winter.  Clean up any spills in your garage right away. This includes oil or grease spills. What can I do in the bathroom?  Use night lights.  Install grab bars by the toilet and in the tub and shower. Do not use towel bars as grab bars.  Use non-skid mats or decals in the tub or shower.  If you need to sit down in the shower, use a plastic, non-slip stool.  Keep the floor dry. Clean up any water that spills on the floor as soon as it happens.  Remove soap buildup in the tub or shower regularly.  Attach bath mats securely with double-sided non-slip rug tape.  Do not have throw rugs and other things on the floor that can make you trip. What can I do in the bedroom?  Use night lights.  Make sure that you have a light by your bed that  is easy to reach.  Do not use any sheets or blankets that are too big for your bed. They should not hang down onto the floor.  Have a firm chair that has side arms. You can use this for support while you get dressed.  Do not have throw rugs and other things on the floor that can make you trip. What can I do in the kitchen?  Clean up any spills right away.  Avoid walking on wet floors.  Keep items that you use a lot in easy-to-reach places.  If you need to reach something above you, use a strong step stool that has a grab bar.  Keep electrical cords out of the way.  Do not use floor polish or wax that makes floors slippery. If you must use wax, use non-skid floor wax.  Do not have throw rugs and other things on the floor that can make you trip. What can I do with my stairs?  Do not leave any items on the stairs.  Make sure that there are handrails on both sides of the stairs and use them. Fix handrails that are broken or loose. Make sure that handrails  are as long as the stairways.  Check any carpeting to make sure that it is firmly attached to the stairs. Fix any carpet that is loose or worn.  Avoid having throw rugs at the top or bottom of the stairs. If you do have throw rugs, attach them to the floor with carpet tape.  Make sure that you have a light switch at the top of the stairs and the bottom of the stairs. If you do not have them, ask someone to add them for you. What else can I do to help prevent falls?  Wear shoes that:  Do not have high heels.  Have rubber bottoms.  Are comfortable and fit you well.  Are closed at the toe. Do not wear sandals.  If you use a stepladder:  Make sure that it is fully opened. Do not climb a closed stepladder.  Make sure that both sides of the stepladder are locked into place.  Ask someone to hold it for you, if possible.  Clearly mark and make sure that you can see:  Any grab bars or handrails.  First and last steps.  Where the edge of each step is.  Use tools that help you move around (mobility aids) if they are needed. These include:  Canes.  Walkers.  Scooters.  Crutches.  Turn on the lights when you go into a dark area. Replace any light bulbs as soon as they burn out.  Set up your furniture so you have a clear path. Avoid moving your furniture around.  If any of your floors are uneven, fix them.  If there are any pets around you, be aware of where they are.  Review your medicines with your doctor. Some medicines can make you feel dizzy. This can increase your chance of falling. Ask your doctor what other things that you can do to help prevent falls. This information is not intended to replace advice given to you by your health care provider. Make sure you discuss any questions you have with your health care provider. Document Released: 01/01/2009 Document Revised: 08/13/2015 Document Reviewed: 04/11/2014 Elsevier Interactive Patient Education  2017 [employer].

## 2020-01-09 ENCOUNTER — Other Ambulatory Visit: Payer: Self-pay | Admitting: Internal Medicine

## 2020-01-10 ENCOUNTER — Other Ambulatory Visit: Payer: Self-pay

## 2020-01-10 ENCOUNTER — Ambulatory Visit (INDEPENDENT_AMBULATORY_CARE_PROVIDER_SITE_OTHER): Payer: Medicare Other

## 2020-01-10 DIAGNOSIS — Z23 Encounter for immunization: Secondary | ICD-10-CM | POA: Diagnosis not present

## 2020-01-13 ENCOUNTER — Ambulatory Visit: Payer: Medicare Other | Attending: Internal Medicine

## 2020-01-13 DIAGNOSIS — Z23 Encounter for immunization: Secondary | ICD-10-CM

## 2020-01-13 NOTE — Progress Notes (Signed)
   Covid-19 Vaccination Clinic  Name:  Barbara Odom    MRN: [id number] DOB: [birth year]  01/13/2020  Barbara Odom was observed post Covid-19 immunization for 15 minutes without incident. She was provided with Vaccine Information Sheet and instruction to access the V-Safe system.   Barbara Odom was instructed to call 911 with any severe reactions post vaccine: Marland Kitchen Difficulty breathing  . Swelling of face and throat  . A fast heartbeat  . A bad rash all over body  . Dizziness and weakness

## 2020-01-16 ENCOUNTER — Ambulatory Visit: Payer: Medicare Other | Admitting: Internal Medicine

## 2020-01-23 ENCOUNTER — Other Ambulatory Visit: Payer: Self-pay

## 2020-01-23 ENCOUNTER — Ambulatory Visit (INDEPENDENT_AMBULATORY_CARE_PROVIDER_SITE_OTHER): Payer: Medicare Other | Admitting: Internal Medicine

## 2020-01-23 ENCOUNTER — Encounter: Payer: Self-pay | Admitting: Internal Medicine

## 2020-01-23 VITALS — BP 144/70 | HR 77 | Ht 66.5 in | Wt 138.0 lb

## 2020-01-23 DIAGNOSIS — R030 Elevated blood-pressure reading, without diagnosis of hypertension: Secondary | ICD-10-CM | POA: Diagnosis not present

## 2020-01-23 DIAGNOSIS — I700 Atherosclerosis of aorta: Secondary | ICD-10-CM

## 2020-01-23 DIAGNOSIS — R079 Chest pain, unspecified: Secondary | ICD-10-CM

## 2020-01-23 DIAGNOSIS — R9431 Abnormal electrocardiogram [ECG] [EKG]: Secondary | ICD-10-CM

## 2020-01-23 NOTE — Patient Instructions (Signed)
Medication Instructions:  Your physician recommends that you continue on your current medications as directed. Please refer to the Current Medication list given to you today.  *If you need a refill on your cardiac medications before your next appointment, please call your pharmacy*  Follow-Up: At [facility], you and your health needs are our priority.  As part of our continuing mission to provide you with exceptional heart care, we have created designated Provider Care Teams.  These Care Teams include your primary Cardiologist (physician) and Advanced Practice Providers (APPs -  Physician Assistants and Nurse Practitioners) who all work together to provide you with the care you need, when you need it.  We recommend signing up for the patient portal called "MyChart".  Sign up information is provided on this After Visit Summary.  MyChart is used to connect with patients for Virtual Visits (Telemedicine).  Patients are able to view lab/test results, encounter notes, upcoming appointments, etc.  Non-urgent messages can be sent to your provider as well.   To learn more about what you can do with MyChart, go to [email or web address].    Your next appointment:   As needed if any new concerns or questions arise.

## 2020-01-23 NOTE — Progress Notes (Signed)
New Outpatient Visit Date: 01/23/2020  Referring Provider: Crecencio Mc, MD [place],   [id number]  Chief Complaint: Chest pain  HPI:  Barbara Odom is a 69 y.o. female who is being seen today for the evaluation of chest pain at the request of Dr. Derrel Nip. She has a history of hyperlipidemia, MAI, migraine headaches, and remote seizures.  She presented to the [facility] emergency department in early October with atypical chest pain.  Work-up was notable for hypokalemia as well as aortic atherosclerosis on CT ordered for exclusion of pulmonary embolism.  She subsequently saw Dr. Derrel Nip, who felt that her symptoms are most consistent with costochondritis.  She was placed on a 5-day course of steroids as well as metoprolol and atorvastatin.  Chest pain has largely resolved, though she still has vague tenderness of her chest at times.  Barbara Odom notes that her chest pain has not been exertional.  She had been going to the gym 3 times a week, though she stopped this at onset of her chest pain.  She has not had any significant shortness of breath nor palpitations, lightheadedness, or edema.  She notes one episode of diaphoresis while tending to her daughter after surgery in September.  This has not recurred.  --------------------------------------------------------------------------------------------------  Cardiovascular History & Procedures: Cardiovascular Problems:  Chest pain  Risk Factors:  Hyperlipidemia and age greater than 39  Cath/PCI:  None  CV Surgery:  None  EP Procedures and Devices:  None  Non-Invasive Evaluation(s):  None  Recent CV Pertinent Labs: Lab Results  Component Value Date   CHOL 256 (H) 12/24/2019   CHOL 192 05/12/2011   HDL 75.80 12/24/2019   HDL 81 (H) 05/12/2011   LDLCALC 166 (H) 12/24/2019   LDLCALC 103 (H) 05/12/2011   LDLDIRECT 114.0 12/14/2016   TRIG 69.0 12/24/2019   TRIG 40 05/12/2011   CHOLHDL 3 12/24/2019   K  4.0 12/24/2019   K 3.4 (L) 05/12/2011   MG 2.0 12/24/2019   BUN 17 12/24/2019   BUN 17 05/12/2011   CREATININE 0.84 12/24/2019   CREATININE 0.76 05/12/2011    --------------------------------------------------------------------------------------------------  Past Medical History:  Diagnosis Date  . Bronchiectasis ([place])   . 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 History of diverticulitis of colon   . Hyperlipidemia   . MAI (mycobacterium avium-intracellulare) ([place])   . Migraine headache   . Osteopenia   . Pneumonia    multi-lobe   . seasonal rhinitis     Past Surgical History:  Procedure Laterality Date  . BREAST BIOPSY Right 2005   Negative- clip placed  . BREAST EXCISIONAL BIOPSY Left 1997   Negative --- Pt states this procedure was a lumpect.  Marland Kitchen BREAST SURGERY     lumpectomy, benign   . CARPAL TUNNEL RELEASE  1983   bilateral   . CERVICAL DISCECTOMY  August 2012   [place]    Current Meds  Medication Sig  . aspirin EC 81 MG tablet Take 81 mg by mouth daily. Swallow whole.  Marland Kitchen atorvastatin (LIPITOR) 20 MG tablet Take 1 tablet (20 mg total) by mouth daily.  . Biotin 5000 MCG CAPS Take 1 capsule by mouth. Daily  . cetirizine (ZYRTEC) 10 MG tablet Take 10 mg by mouth daily.  . Cholecalciferol (VITAMIN D-3 PO) Take 2,000 Units by mouth.  . Cranberry-Vitamin C (CRANBERRY CONCENTRATE/VITAMINC PO) Take 4,200 mg by mouth daily.  Marland Kitchen  diazepam (VALIUM) 5 MG tablet Take 5 mg by mouth every 8 (eight) hours as needed.  Marland Kitchen GLUCOSAMINE-CHONDROITIN PO Take 1 tablet by mouth daily.  . Magnesium 400 MG CAPS Take 1 capsule by mouth daily.  . metoprolol succinate (TOPROL-XL) 25 MG 24 hr tablet Take 1 tablet (25 mg total) by mouth daily.  . Multiple Vitamin (MULTIVITAMIN) tablet Take 1 tablet by mouth daily.    . Multiple Vitamins-Minerals (PRESERVISION AREDS 2 PO) Take 1 capsule by mouth.  Marland Kitchen PHENObarbital (LUMINAL) 60 MG  tablet TAKE 1 TABLET BY MOUTH EVERY DAY  . Probiotic Product (SOLUBLE FIBER/PROBIOTICS PO) Take 1 capsule by mouth daily.  . rizatriptan (MAXALT) 10 MG tablet TAKE 1 TABLET BY MOUTH AS NEEDED FOR MIGRAINE. MAY REPEAT IN 2 HOURS IF NEEDED  . umeclidinium-vilanterol (ANORO ELLIPTA) 62.5-25 MCG/INH AEPB   . vitamin B-12 (CYANOCOBALAMIN) 1000 MCG tablet Take 1,000 mcg by mouth daily.  Marland Kitchen zolpidem (AMBIEN) 10 MG tablet Take 1 tablet (10 mg total) by mouth at bedtime as needed for sleep.    Allergies: Penicillins  Social History   Tobacco Use  . Smoking status: Never Smoker  . Smokeless tobacco: Never Used  Vaping Use  . Vaping Use: Never used  Substance Use Topics  . Alcohol use: Yes    Comment: socially   . Drug use: No    Family History  Problem Relation Age of Onset  . Heart disease Mother        Atrial fibrillation  . Coronary artery disease Mother   . Cancer Maternal Grandmother        colon Ca  . Cancer Maternal Grandfather        Colon CA  . Macular degeneration Father   . Congestive Heart Failure Father     Review of Systems: A 12-system review of systems was performed and was negative except as noted in the HPI.  --------------------------------------------------------------------------------------------------  Physical Exam: BP (!) 144/70 (BP Location: Right Arm, Patient Position: Sitting, Cuff Size: Normal)   Pulse 77   Ht 5' 6.5" (1.689 m)   Wt 138 lb (62.6 kg)   SpO2 97%   BMI 21.94 kg/m   General: NAD. HEENT: No conjunctival pallor or scleral icterus. Facemask in place. Neck: Supple without lymphadenopathy, thyromegaly, JVD, or HJR. No carotid bruit. Lungs: Normal work of breathing. Clear to auscultation bilaterally without wheezes or crackles. Heart: Regular rate and rhythm without murmurs, rubs, or gallops. Non-displaced PMI. Abd: Bowel sounds present. Soft, NT/ND without hepatosplenomegaly Ext: No lower extremity edema. Radial, PT, and DP pulses are  2+ bilaterally Skin: Warm and dry without rash. Neuro: CNIII-XII intact. Strength and fine-touch sensation intact in upper and lower extremities bilaterally. Psych: Normal mood and affect.  EKG: Normal sinus rhythm without abnormality.  Lab Results  Component Value Date   WBC 6.6 12/20/2019   HGB 13.6 12/20/2019   HCT 40.7 12/20/2019   MCV 92.1 12/20/2019   PLT 311 12/20/2019    Lab Results  Component Value Date   NA 136 12/24/2019   K 4.0 12/24/2019   CL 98 12/24/2019   CO2 29 12/24/2019   BUN 17 12/24/2019   CREATININE 0.84 12/24/2019   GLUCOSE 97 12/24/2019   ALT 12 02/26/2019    Lab Results  Component Value Date   CHOL 256 (H) 12/24/2019   HDL 75.80 12/24/2019   LDLCALC 166 (H) 12/24/2019   LDLDIRECT 114.0 12/14/2016   TRIG 69.0 12/24/2019   CHOLHDL 3 12/24/2019     --------------------------------------------------------------------------------------------------  ASSESSMENT AND PLAN: Chest pain: I agree with Dr. Derrel Nip that episode of chest pain leading to recent ED visit was not consistent with angina.  Given that it has resolved with steroids though some reproducible chest wall tenderness persists suggests musculoskeletal etiology such as costochondritis.  I have personally reviewed her CT of the chest which showed small amount of aortic atherosclerosis.  No significant coronary artery calcification was present.  Barbara Odom exercises regularly without any exercise-induced chest pain or shortness of breath.  Her physical exam and EKG today are normal.  We discussed the utility of noninvasive ischemia testing but have agreed to defer this unless she has recurrent symptoms.  I think it is reasonable to continue with medical therapy, including beta-blocker and statin, given aortic atherosclerosis.  Elevated blood pressure: Blood pressure is mildly elevated today.  Metoprolol was recently added, which is reasonable.  However, if blood pressure remains elevated, an  alternative agent may be more effective for blood pressure control.  I will defer ongoing management to Dr. Derrel Nip.  Aortic atherosclerosis: Incidentally noted on recent CTA.  Aspirin and atorvastatin should be continued.  Follow-up: Return to clinic as needed.  Nelva Bush, MD 01/23/2020 11:32 AM

## 2020-01-24 ENCOUNTER — Encounter: Payer: Self-pay | Admitting: Internal Medicine

## 2020-01-24 DIAGNOSIS — R030 Elevated blood-pressure reading, without diagnosis of hypertension: Secondary | ICD-10-CM | POA: Insufficient documentation

## 2020-02-04 ENCOUNTER — Ambulatory Visit (INDEPENDENT_AMBULATORY_CARE_PROVIDER_SITE_OTHER): Payer: Medicare Other | Admitting: Pulmonary Disease

## 2020-02-04 ENCOUNTER — Other Ambulatory Visit: Payer: Self-pay

## 2020-02-04 ENCOUNTER — Encounter: Payer: Self-pay | Admitting: Pulmonary Disease

## 2020-02-04 VITALS — BP 122/74 | HR 71 | Temp 97.3°F | Ht 66.5 in | Wt 141.8 lb

## 2020-02-04 DIAGNOSIS — J470 Bronchiectasis with acute lower respiratory infection: Secondary | ICD-10-CM | POA: Diagnosis not present

## 2020-02-04 DIAGNOSIS — J4540 Moderate persistent asthma, uncomplicated: Secondary | ICD-10-CM

## 2020-02-04 DIAGNOSIS — J479 Bronchiectasis, uncomplicated: Secondary | ICD-10-CM

## 2020-02-04 DIAGNOSIS — J449 Chronic obstructive pulmonary disease, unspecified: Secondary | ICD-10-CM | POA: Diagnosis not present

## 2020-02-04 MED ORDER — AZITHROMYCIN 250 MG PO TABS: ORAL_TABLET | ORAL | 0 refills | Status: AC

## 2020-02-04 NOTE — Patient Instructions (Signed)
We have sent in the azithromycin for you.  Continue Anoro.  We will see you in follow-up in 6 months time call sooner should any new problems arise.

## 2020-02-04 NOTE — Progress Notes (Signed)
Subjective:    Patient ID: Barbara Odom, female    DOB: 10/03/1950, 69 y.o.   MRN: [id number]  Requesting MD/Service:  Dr. Deborra Medina Date of initial consultation: 03/03/17 by Dr. Gwendolyn Lima Reason for consultation: recent pneumonia, suspected asthma  PT PROFILE: 69 y.o. female with minimal smoking history (age 10-21) hospitalized briefly in July 2018 with dx of PNA. Reports persistent "wheezing" since then.   PROBLEMS: Mild RML, lingular bronchiectasis with tendency for exacerbations  DATA: CTA chest 10/07/16: Ill-defined opacities in RML, lingula, LUL and infrahilar region of RLL.  These opacities have a nodularity to them. CT chest 01/20/17: Overall, markedly improved opacities compared to prior study.  However, there remains minimal opacities and possible mild bronchiectasis in RML and lingula PFTs 12/05/17: Spirometry normal, lung volumes normal, DLCO normal, flow volume loop normal CT chest 02/06/18: Slight interval progression in patchy peribronchovascular nodularity, consolidation/volume loss and mild bronchiectasis, findings most indicative of an atypical infectious process such as mycobacterium avium complex  INTERVAL: Last seen by me  09/04/2019.  No major pulmonary events since then.   HPI Barbara Odom is a 69 year old lifelong never smoker he of bronchiectasis and chronic asthmatic bronchitis, who presents for follow-up.  This is a scheduled visit.  The patient has issues with exacerbations of bronchiectasis approximately every 3 months.  For the last 3 weeks she has had cough productive of greenish "plugs".  No hemoptysis.  This usually resolves with doxycycline or azithromycin.  Here recently azithromycin has been more effective.  This was switched for her in June 2021.  States that this works well for her.  She is on Anoro and as needed albuterol.  She does not tolerate ICS due to thrush even with careful oral hygiene.  She has not had any fevers, chills or sweats.   Voices no other complaint.   Review of Systems A 10 point review of systems was performed and it is as noted above otherwise negative.  Patient Active Problem List   Diagnosis Date Noted  . Elevated blood pressure reading 01/24/2020  . 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 Hepatic steatosis 01/20/2017  . Aortic atherosclerosis ([place]) 01/20/2017  . Fatigue 12/17/2016  . Abnormal EKG 10/08/2016  . History of hematuria 11/16/2014  . Encounter for preventive health examination 11/16/2014  . Atypical chest pain 09/25/2013  . Bronchitis, chronic obstructive w acute bronchitis ([place]) 06/08/2013  . Lumbago without sciatica 02/26/2011  . Seizure disorder ([place])   . Migraine headache   . History of diverticulitis of colon   . Osteopenia   . Screening for colon cancer 02/23/2011  . Screening for cervical cancer 02/23/2011  . [place] DISEASE, CERVICAL 03/02/2007  . ALLERGIC RHINITIS 03/01/2007  . DIVERTICULOSIS, COLON 03/01/2007   Social History   Tobacco Use  . Smoking status: Never Smoker  . Smokeless tobacco: Never Used  Substance Use Topics  . Alcohol use: Yes    Alcohol/week: 2.0 standard drinks    Types: 1 Cans of beer, 1 Shots of liquor per week   Allergies  Allergen Reactions  . Penicillins Other (See Comments)    Reaction occurred as a child; "mom described something that sounds like bronchospasms"  . Metoprolol Rash   Current medications reviewed.  Immunization History  Administered Date(s) Administered  . Fluad Quad(high Dose 65+) 01/02/2019, 01/10/2020  . Influenza Split 02/05/2012, 12/19/2012  . PFIZER(Purple Top)SARS-COV-2 Vaccination 05/03/2019, 05/28/2019, 01/13/2020  . Pneumococcal  Conjugate-13 02/26/2019  . Pneumococcal Polysaccharide-23 03/26/2020  . Tdap 01/23/2013  . Zoster Recombinat (Shingrix) 01/29/2019, 04/24/2020      Objective:   Physical  Exam BP 122/74 (BP Location: Left Arm, Cuff Size: Normal)   Pulse 71   Temp (!) 97.3 F (36.3 C) (Temporal)   Ht 5' 6.5" (1.689 m)   Wt 141 lb 12.8 oz (64.3 kg)   SpO2 98%   BMI 22.54 kg/m  GENERAL: Fully ambulatory, well-developed well-nourished, no acute distress. HEAD: Normocephalic, atraumatic.  EYES: Pupils equal, round, reactive to light.  No scleral icterus.  MOUTH: Can requirement NECK: Supple. No thyromegaly. Trachea midline. No JVD.  No adenopathy. PULMONARY: Good air entry bilaterally.  Coarse breath sounds throughout, otherwise no adventitious sounds. CARDIOVASCULAR: S1 and S2. Regular rate and rhythm.  No rubs, murmurs or gallops heard. ABDOMEN: Benign.   MUSCULOSKELETAL: No joint deformity, no clubbing, no edema.  NEUROLOGIC: No focal deficit, no gait disturbance, speech is fluent. SKIN: Intact,warm,dry.  On limited exam no rashes PSYCH: Mood and behavior normal.     Assessment & Plan:     ICD-10-CM   1. Bronchiectasis with acute lower respiratory infection ([place])  J47.0    Takes azithromycin for exacerbations Exacerbations occur once every 3 months or so Continue pulmonary hygiene  2. Chronic asthmatic bronchitis ([facility])  J44.9    Unable to tolerate ICS Good control with Anoro   Meds ordered this encounter  Medications  . azithromycin (ZITHROMAX Z-PAK) 250 MG tablet    Sig: Take 2 tablets (500 mg) on  Day 1,  followed by 1 tablet (250 mg) once daily on Days 2 through 5.    Dispense:  6 each    Refill:  0   Asked her to consider taking Azithromycin chronically, 250 mg q. Monday Wednesday Friday.  She is reluctant due to issues with thrush that develop with antibiotics.  Patient is anticipating to move to the [facility] area.  I have encouraged her to procure primary care physician and consultation with pulmonologist in that area.  We will see her in 6 months time if she has not procure the care of a pulmonary physician.  Renold Don, MD [place]  [facility]   *This note was dictated using voice recognition software/Dragon.  Despite best efforts to proofread, errors can occur which can change the meaning.  Any change was purely unintentional.

## 2020-02-08 ENCOUNTER — Other Ambulatory Visit: Payer: Medicare Other

## 2020-02-08 DIAGNOSIS — Z20822 Contact with and (suspected) exposure to covid-19: Secondary | ICD-10-CM

## 2020-02-09 LAB — NOVEL CORONAVIRUS, NAA: SARS-CoV-2, NAA: NOT DETECTED

## 2020-02-09 LAB — SARS-COV-2, NAA 2 DAY TAT

## 2020-02-11 ENCOUNTER — Telehealth: Payer: Self-pay

## 2020-02-11 NOTE — Telephone Encounter (Signed)
Are you sure she is going to be back from [place] by then?  Attending mother's funeral on Thursday   If she is asymptomatic and in town,  Yes she can come in

## 2020-02-11 NOTE — Telephone Encounter (Signed)
Pt has an appt on 02/18/20. She traveled to [place] for [employer]. She states that she has had PCR tests and they are all negative. No symptoms. Please advise about appt.

## 2020-02-11 NOTE — Telephone Encounter (Signed)
Is pt allowed to come in for appt?

## 2020-02-18 ENCOUNTER — Ambulatory Visit (INDEPENDENT_AMBULATORY_CARE_PROVIDER_SITE_OTHER): Payer: Medicare Other | Admitting: Internal Medicine

## 2020-02-18 ENCOUNTER — Other Ambulatory Visit: Payer: Self-pay

## 2020-02-18 ENCOUNTER — Encounter: Payer: Self-pay | Admitting: Internal Medicine

## 2020-02-18 VITALS — BP 128/82 | HR 94 | Temp 98.3°F | Resp 15 | Ht 66.5 in | Wt 139.8 lb

## 2020-02-18 DIAGNOSIS — E785 Hyperlipidemia, unspecified: Secondary | ICD-10-CM

## 2020-02-18 DIAGNOSIS — R0789 Other chest pain: Secondary | ICD-10-CM | POA: Diagnosis not present

## 2020-02-18 DIAGNOSIS — Z0000 Encounter for general adult medical examination without abnormal findings: Secondary | ICD-10-CM

## 2020-02-18 DIAGNOSIS — E1169 Type 2 diabetes mellitus with other specified complication: Secondary | ICD-10-CM

## 2020-02-18 DIAGNOSIS — Z79899 Other long term (current) drug therapy: Secondary | ICD-10-CM | POA: Diagnosis not present

## 2020-02-18 DIAGNOSIS — K760 Fatty (change of) liver, not elsewhere classified: Secondary | ICD-10-CM

## 2020-02-18 DIAGNOSIS — G40909 Epilepsy, unspecified, not intractable, without status epilepticus: Secondary | ICD-10-CM

## 2020-02-18 DIAGNOSIS — M85852 Other specified disorders of bone density and structure, left thigh: Secondary | ICD-10-CM | POA: Diagnosis not present

## 2020-02-18 DIAGNOSIS — N764 Abscess of vulva: Secondary | ICD-10-CM

## 2020-02-18 DIAGNOSIS — I700 Atherosclerosis of aorta: Secondary | ICD-10-CM | POA: Diagnosis not present

## 2020-02-18 DIAGNOSIS — R030 Elevated blood-pressure reading, without diagnosis of hypertension: Secondary | ICD-10-CM | POA: Diagnosis not present

## 2020-02-18 DIAGNOSIS — Z1211 Encounter for screening for malignant neoplasm of colon: Secondary | ICD-10-CM

## 2020-02-18 DIAGNOSIS — E559 Vitamin D deficiency, unspecified: Secondary | ICD-10-CM

## 2020-02-18 DIAGNOSIS — R82998 Other abnormal findings in urine: Secondary | ICD-10-CM

## 2020-02-18 MED ORDER — ONDANSETRON 8 MG PO TBDP
ORAL_TABLET | ORAL | 2 refills | Status: DC
Start: 2020-02-18 — End: 2023-03-08

## 2020-02-18 MED ORDER — SULFAMETHOXAZOLE-TRIMETHOPRIM 800-160 MG PO TABS: 1.0000 | ORAL_TABLET | Freq: Two times a day (BID) | ORAL | 1 refills | Status: DC

## 2020-02-18 NOTE — Assessment & Plan Note (Signed)
Will stop the metoprolol and recheck In  2 weeks

## 2020-02-18 NOTE — Assessment & Plan Note (Addendum)
She appears to have a sebaceous cyst that has been present for a year by history but became enflamed and tender several weeks ago and finally started draining 3 days ago.  Septra ds bid x 7 days was prescribed,  With continued application of warm compresses.  She did not tolerate septra due to abd pain and nausea.  Changing regimen to cephalexin

## 2020-02-18 NOTE — Assessment & Plan Note (Signed)

## 2020-02-18 NOTE — Assessment & Plan Note (Signed)
Non cardiac, due to  costochondritis

## 2020-02-18 NOTE — Progress Notes (Signed)
Patient ID: Barbara Odom, female    DOB: 03-Aug-1950  Age: 69 y.o. MRN: [id number]  The patient is here for follow up  and management of other and acute problems.   The risk factors are reflected in the social history.  The roster of all physicians providing medical care to patient - is listed in the Snapshot section of the chart.  Activities of daily living:  The patient is 100% independent in all ADLs: dressing, toileting, feeding as well as independent mobility  Home safety : The patient has smoke detectors in the home. They wear seatbelts.  There are no firearms at home. There is no violence in the home.   There is no risks for hepatitis, STDs or HIV. There is no   history of blood transfusion. They have no travel history to infectious disease endemic areas of the world.  The patient has seen their dentist in the last six month. They have seen their eye doctor in the last year. They admit to slight hearing difficulty with regard to whispered voices and some television programs.  They have deferred audiologic testing in the last year.  They do not  have excessive sun exposure. Discussed the need for sun protection: hats, long sleeves and use of sunscreen if there is significant sun exposure.   Diet: the importance of a healthy diet is discussed. They do have a healthy diet.  The benefits of regular aerobic exercise were discussed. She walks 4 times per week ,  20 minutes.   Depression screen: there are no signs or vegative symptoms of depression- irritability, change in appetite, anhedonia, sadness/tearfullness.  Cognitive assessment: the patient manages all their financial and personal affairs and is actively engaged. They could relate day,date,year and events; recalled 2/3 objects at 3 minutes; performed clock-face test normally.  The following portions of the patient's history were reviewed and updated as appropriate: allergies, current medications, past family history, past medical  history,  past surgical history, past social history  and problem list.  Visual acuity was not assessed per patient preference since she has regular follow up with her ophthalmologist. Hearing and body mass index were assessed and reviewed.   During the course of the visit the patient was educated and counseled about appropriate screening and preventive services including : fall prevention , diabetes screening, nutrition counseling, colorectal cancer screening, and recommended immunizations.    CC: The primary encounter diagnosis was Hepatic steatosis. Diagnoses of Elevated blood pressure reading, Hyperlipidemia associated with type 2 diabetes mellitus ([place]), Long-term use of high-risk medication, Vitamin D deficiency, Osteopenia of neck of left femur, Furuncle of labia majora, Encounter for preventive health examination, Atypical chest pain, Aortic atherosclerosis ([place]), Seizure disorder ([place]), Screening for colon cancer, and Calcium oxalate crystals in urine were also pertinent to this visit.  1) tender draining boil on right labia majora.  Started as a  Small nonenflamed sebaceous cyst  1 year ago  after shaving the area .  Has not enlarged or resolved,  And was never tender until the last several weeks.  Has been festeringfor for the   past several weeks  and finally started to drain 2 days ago.  Still very tender   2) Bronchiectasis:  Has seen Pulmonary.  Likes Dr Patsey Berthold.  No bronch planned as she has not had recurrent bacterial infections.  Has an rx for azithromycin a  3) history of chest pain:  Has had a noninvasive Negative cardiac evaluation .  Statin recommended for management  of atherosclerosis noted on imaging study .  She has been taking atorvastatin since october with good tolerance.  Has not not lfts    4) Elevated blood pressure:  Has been taking metoprolol since the episode of chest pain. Readings  Have been < 110/70.  She would like to suspend the  medication and follow BP  readings.  History Barbara Odom has a past medical history of Bronchiectasis ([place]), Calcium oxalate crystals in urine (06/13/2019), grand mal, History of diverticulitis of colon, Hyperlipidemia, MAI (mycobacterium avium-intracellulare) ([place]), Migraine headache, Osteopenia, Pneumonia, and seasonal rhinitis.   She has a past surgical history that includes Carpal tunnel release (1983); Breast surgery; Cervical discectomy (August 2012); Breast excisional biopsy (Left, 1997); and Breast biopsy (Right, 2005).   Her family history includes Cancer in her maternal grandfather and maternal grandmother; Congestive Heart Failure in her father; Coronary artery disease in her mother; Heart disease in her mother; Macular degeneration in her father.She reports that she has never smoked. She has never used smokeless tobacco. She reports current alcohol use of about 2.0 standard drinks of alcohol per week. She reports that she does not use drugs.  Outpatient Medications Prior to Visit  Medication Sig Dispense Refill  . aspirin EC 81 MG tablet Take 81 mg by mouth daily. Swallow whole.    Marland Kitchen atorvastatin (LIPITOR) 20 MG tablet Take 1 tablet (20 mg total) by mouth daily. 90 tablet 3  . Biotin 5000 MCG CAPS Take 1 capsule by mouth. Daily    . cetirizine (ZYRTEC) 10 MG tablet Take 10 mg by mouth daily.    . Cholecalciferol (VITAMIN D-3 PO) Take 2,000 Units by mouth.    . Cranberry-Vitamin C (CRANBERRY CONCENTRATE/VITAMINC PO) Take 4,200 mg by mouth daily.    . diazepam (VALIUM) 5 MG tablet Take 5 mg by mouth every 8 (eight) hours as needed.    Marland Kitchen GLUCOSAMINE-CHONDROITIN PO Take 1 tablet by mouth daily.    . Magnesium 400 MG CAPS Take 1 capsule by mouth daily.    . metoprolol succinate (TOPROL-XL) 25 MG 24 hr tablet Take 1 tablet (25 mg total) by mouth daily. 90 tablet 3  . Multiple Vitamin (MULTIVITAMIN) tablet Take 1 tablet by mouth daily.      . Multiple Vitamins-Minerals (PRESERVISION AREDS 2 PO) Take 1 capsule by  mouth.    Marland Kitchen PHENObarbital (LUMINAL) 60 MG tablet TAKE 1 TABLET BY MOUTH EVERY DAY 90 tablet 3  . Probiotic Product (SOLUBLE FIBER/PROBIOTICS PO) Take 1 capsule by mouth daily.    . rizatriptan (MAXALT) 10 MG tablet TAKE 1 TABLET BY MOUTH AS NEEDED FOR MIGRAINE. MAY REPEAT IN 2 HOURS IF NEEDED 9 tablet 7  . umeclidinium-vilanterol (ANORO ELLIPTA) 62.5-25 MCG/INH AEPB     . vitamin B-12 (CYANOCOBALAMIN) 1000 MCG tablet Take 1,000 mcg by mouth daily.    Marland Kitchen zolpidem (AMBIEN) 10 MG tablet Take 1 tablet (10 mg total) by mouth at bedtime as needed for sleep. 30 tablet 5   No facility-administered medications prior to visit.    Review of Systems   Patient denies headache, fevers, malaise, unintentional weight loss, skin rash, eye pain, sinus congestion and sinus pain, sore throat, dysphagia,  hemoptysis , cough, dyspnea, wheezing, chest pain, palpitations, orthopnea, edema, abdominal pain, nausea, melena, diarrhea, constipation, flank pain, dysuria, hematuria, urinary  Frequency, nocturia, numbness, tingling, seizures,  Focal weakness, Loss of consciousness,  Tremor, insomnia, depression, anxiety, and suicidal ideation.     Objective:  BP 128/82 (BP Location: Left Arm,  Patient Position: Sitting, Cuff Size: Normal)   Pulse 94   Temp 98.3 F (36.8 C) (Oral)   Resp 15   Ht 5' 6.5" (1.689 m)   Wt 139 lb 12.8 oz (63.4 kg)   SpO2 98%   BMI 22.23 kg/m   Physical Exam  General appearance: alert, cooperative and appears stated age Head: Normocephalic, without obvious abnormality, atraumatic Eyes: conjunctivae/corneas clear. PERRL, EOM's intact. Fundi benign. Ears: normal TM's and external ear canals both ears Nose: Nares normal. Septum midline. Mucosa normal. No drainage or sinus tenderness. Throat: lips, mucosa, and tongue normal; teeth and gums normal Neck: no adenopathy, no carotid bruit, no JVD, supple, symmetrical, trachea midline and thyroid not enlarged, symmetric, no  tenderness/mass/nodules Lungs: clear to auscultation bilaterally Heart: regular rate and rhythm, S1, S2 normal, no murmur, click, rub or gallop Abdomen: soft, non-tender; bowel sounds normal; no masses,  no organomegaly Extremities: extremities normal, atraumatic, no cyanosis or edema Pulses: 2+ and symmetric Skin: Skin color, texture, turgor normal. No rashes or lesions GYN:  Limited to evaluation of labia majora showing a tender draining abscess on her  right labia majora Neurologic: Alert and oriented X 3, normal strength and tone. Normal symmetric reflexes. Normal coordination and gait.    Assessment & Plan:   Problem List Items Addressed This Visit      Unprioritized   Seizure disorder ([place])    Managed since adolescence with phenobarbital.  Level is pending       Screening for colon cancer    She has been advised to continue screening at 5 yr intervals due to [place] .  Last one was in 2018      Osteopenia   Hepatic steatosis - Primary    Noted on prior CT.  LFTs have been normal and she is now taking a statin.       Relevant Orders   Comprehensive metabolic panel   Furuncle of labia majora    She appears to have a sebaceous cyst that has been present for a year by history but became enflamed and tender several weeks ago and finally started draining 3 days ago.  Septra ds bid x 7 days was prescribed,  With continued application of warm compresses.  She did not tolerate septra due to abd pain and nausea.  Changing regimen to cephalexin      Relevant Medications   sulfamethoxazole-trimethoprim (BACTRIM DS) 800-160 MG tablet   Encounter for preventive health examination    age appropriate education and counseling updated, referrals for preventative services and immunizations addressed, dietary and smoking counseling addressed, most recent labs reviewed.  I have personally reviewed and have noted:  1) the patient's medical and social history 2) The pt's use of alcohol, tobacco,  and illicit drugs 3) The patient's current medications and supplements 4) Functional ability including ADL's, fall risk, home safety risk, hearing and visual impairment 5) Diet and physical activities 6) Evidence for depression or mood disorder 7) The patient's height, weight, and BMI have been recorded in the chart  I have made referrals, and provided counseling and education based on review of the above      Elevated blood pressure reading    Will stop the metoprolol and recheck In  2 weeks      Calcium oxalate crystals in urine    She was referred to Hollice Espy, urology for evaluation by Dawson Bills after presenting with microscopic hematuria and bladder pain.  She had a remote history of IC.  Cysto was unremarkable.  Annual UA recommended .       Atypical chest pain    Non cardiac, due to  costochondritis       Aortic atherosclerosis [facility])    Reviewed findings of prior CT scan today..  Patient is willing to  Continue atorvastatin  20 mg  once daily        Other Visit Diagnoses    Hyperlipidemia associated with type 2 diabetes mellitus ([place])       Relevant Orders   Lipid panel   Long-term use of high-risk medication       Relevant Orders   Phenobarbital level   Vitamin D deficiency       Relevant Orders   VITAMIN D 25 Hydroxy (Vit-D Deficiency, Fractures)      I am having Barbara A. Eberle "Chris" start on sulfamethoxazole-trimethoprim. I am also having her maintain her multivitamin, Probiotic Product (SOLUBLE FIBER/PROBIOTICS PO), diazepam, Magnesium, Cranberry-Vitamin C (CRANBERRY CONCENTRATE/VITAMINC PO), vitamin B-12, Biotin, GLUCOSAMINE-CHONDROITIN PO, zolpidem, cetirizine, PHENObarbital, Anoro Ellipta, metoprolol succinate, atorvastatin, Cholecalciferol (VITAMIN D-3 PO), aspirin EC, Multiple Vitamins-Minerals (PRESERVISION AREDS 2 PO), rizatriptan, and ondansetron.  Meds ordered this encounter  Medications  . ondansetron (ZOFRAN-ODT) 8 MG disintegrating  tablet    Sig: TAKE 1 TABLET EVERY 6 HOURS AS NEEDED FOR NAUSEA AND VOMITING    Dispense:  30 tablet    Refill:  2  . sulfamethoxazole-trimethoprim (BACTRIM DS) 800-160 MG tablet    Sig: Take 1 tablet by mouth 2 (two) times daily.    Dispense:  14 tablet    Refill:  1  A total of 40 minutes was spent with patient more than half of which was spent in counseling patient on the above mentioned issues , reviewing and explaining recent labs and imaging studies done, and coordination of care.  There are no discontinued medications.  Follow-up: No follow-ups on file.   Crecencio Mc, MD

## 2020-02-18 NOTE — Assessment & Plan Note (Addendum)
Reviewed findings of prior CT scan today..  Patient is willing to  Continue atorvastatin  20 mg  once daily

## 2020-02-18 NOTE — Patient Instructions (Addendum)
Stop the metoprolol and RTC 2 weeks (ish) for BP check)   And labs   Take the z pak if you develop increased cough and sputum production    Septra DS twice daily for one week   For the labial infection    Daily use of a probiotic advised for 3 weeks.

## 2020-02-20 ENCOUNTER — Other Ambulatory Visit: Payer: Self-pay | Admitting: Internal Medicine

## 2020-02-20 ENCOUNTER — Other Ambulatory Visit: Payer: Self-pay

## 2020-02-20 ENCOUNTER — Ambulatory Visit
Admission: RE | Admit: 2020-02-20 | Discharge: 2020-02-20 | Disposition: A | Discharge status: Home / Self Care | Payer: Medicare Other | Source: Ambulatory Visit | Attending: Internal Medicine | Admitting: Internal Medicine

## 2020-02-20 DIAGNOSIS — Z1231 Encounter for screening mammogram for malignant neoplasm of breast: Secondary | ICD-10-CM | POA: Insufficient documentation

## 2020-02-20 MED ORDER — CEPHALEXIN 500 MG PO CAPS: 500.0000 mg | ORAL_CAPSULE | Freq: Four times a day (QID) | ORAL | 0 refills | Status: DC

## 2020-02-20 NOTE — Assessment & Plan Note (Addendum)
She was referred to Hollice Espy, urology for evaluation by Dawson Bills after presenting with microscopic hematuria and bladder pain.  She had a remote history of IC.  Cysto was unremarkable.  Annual UA recommended .

## 2020-02-20 NOTE — Assessment & Plan Note (Signed)
She has been advised to continue screening at 5 yr intervals due to [place] .  Last one was in 2018

## 2020-02-20 NOTE — Assessment & Plan Note (Signed)
Noted on prior CT.  LFTs have been normal and she is now taking a statin.

## 2020-02-20 NOTE — Assessment & Plan Note (Signed)
Managed since adolescence with phenobarbital.  Level is pending

## 2020-03-03 ENCOUNTER — Other Ambulatory Visit: Payer: Self-pay | Admitting: Internal Medicine

## 2020-03-03 NOTE — Telephone Encounter (Signed)
RX Refill:Ambien Last Seen:02-18-20 Last ordered:02-12-2019

## 2020-03-04 ENCOUNTER — Other Ambulatory Visit: Payer: Self-pay | Admitting: Internal Medicine

## 2020-03-04 DIAGNOSIS — G8929 Other chronic pain: Secondary | ICD-10-CM

## 2020-03-04 MED ORDER — TIZANIDINE HCL 4 MG PO TABS: 4.0000 mg | ORAL_TABLET | Freq: Four times a day (QID) | ORAL | 0 refills | Status: DC | PRN

## 2020-03-04 NOTE — Assessment & Plan Note (Signed)
Recurrent flare triggered by flying.  Tizanidine sent to [facility]

## 2020-03-09 DIAGNOSIS — L309 Dermatitis, unspecified: Secondary | ICD-10-CM | POA: Diagnosis not present

## 2020-03-11 ENCOUNTER — Other Ambulatory Visit: Payer: Self-pay | Admitting: Internal Medicine

## 2020-03-19 ENCOUNTER — Other Ambulatory Visit: Payer: Self-pay | Admitting: Internal Medicine

## 2020-03-19 DIAGNOSIS — G40909 Epilepsy, unspecified, not intractable, without status epilepticus: Secondary | ICD-10-CM

## 2020-03-26 ENCOUNTER — Ambulatory Visit (INDEPENDENT_AMBULATORY_CARE_PROVIDER_SITE_OTHER): Payer: Medicare Other

## 2020-03-26 ENCOUNTER — Other Ambulatory Visit: Payer: Self-pay

## 2020-03-26 VITALS — BP 150/80

## 2020-03-26 DIAGNOSIS — E785 Hyperlipidemia, unspecified: Secondary | ICD-10-CM | POA: Diagnosis not present

## 2020-03-26 DIAGNOSIS — E1169 Type 2 diabetes mellitus with other specified complication: Secondary | ICD-10-CM | POA: Diagnosis not present

## 2020-03-26 DIAGNOSIS — Z79899 Other long term (current) drug therapy: Secondary | ICD-10-CM | POA: Diagnosis not present

## 2020-03-26 DIAGNOSIS — K760 Fatty (change of) liver, not elsewhere classified: Secondary | ICD-10-CM | POA: Diagnosis not present

## 2020-03-26 DIAGNOSIS — Z23 Encounter for immunization: Secondary | ICD-10-CM

## 2020-03-26 DIAGNOSIS — E559 Vitamin D deficiency, unspecified: Secondary | ICD-10-CM | POA: Diagnosis not present

## 2020-03-26 LAB — VITAMIN D 25 HYDROXY (VIT D DEFICIENCY, FRACTURES): VITD: 71.77 ng/mL (ref 30.00–100.00)

## 2020-03-26 LAB — COMPREHENSIVE METABOLIC PANEL
ALT: 17 U/L (ref 0–35)
AST: 18 U/L (ref 0–37)
Albumin: 4.7 g/dL (ref 3.5–5.2)
Alkaline Phosphatase: 54 U/L (ref 39–117)
BUN: 23 mg/dL (ref 6–23)
CO2: 29 mEq/L (ref 19–32)
Calcium: 10.3 mg/dL (ref 8.4–10.5)
Chloride: 96 mEq/L (ref 96–112)
Creatinine, Ser: 0.88 mg/dL (ref 0.40–1.20)
GFR: 67.96 mL/min (ref >60.00)
Glucose, Bld: 92 mg/dL (ref 70–99)
Potassium: 3.5 mEq/L (ref 3.5–5.1)
Sodium: 135 mEq/L (ref 135–145)
Total Bilirubin: 0.8 mg/dL (ref 0.2–1.2)
Total Protein: 7.8 g/dL (ref 6.0–8.3)

## 2020-03-26 LAB — CBC WITH DIFFERENTIAL/PLATELET
Basophils Absolute: 0 10*3/uL (ref 0.0–0.1)
Basophils Relative: 0.5 % (ref 0.0–3.0)
Eosinophils Absolute: 0.1 10*3/uL (ref 0.0–0.7)
Eosinophils Relative: 1.5 % (ref 0.0–5.0)
HCT: 42.6 % (ref 36.0–46.0)
Hemoglobin: 14.3 g/dL (ref 12.0–15.0)
Lymphocytes Relative: 27.5 % (ref 12.0–46.0)
Lymphs Abs: 2.2 10*3/uL (ref 0.7–4.0)
MCHC: 33.6 g/dL (ref 30.0–36.0)
MCV: 94.1 fl (ref 78.0–100.0)
Monocytes Absolute: 0.7 10*3/uL (ref 0.1–1.0)
Monocytes Relative: 8.1 % (ref 3.0–12.0)
Neutro Abs: 5.1 10*3/uL (ref 1.4–7.7)
Neutrophils Relative %: 62.4 % (ref 43.0–77.0)
Platelets: 284 10*3/uL (ref 150.0–400.0)
RBC: 4.52 Mil/uL (ref 3.87–5.11)
RDW: 14.1 % (ref 11.5–15.5)
WBC: 8.2 10*3/uL (ref 4.0–10.5)

## 2020-03-26 LAB — LIPID PANEL
Cholesterol: 211 mg/dL — ABNORMAL HIGH (ref 0–200)
HDL: 119.1 mg/dL (ref >39.00)
LDL Cholesterol: 76 mg/dL (ref 0–99)
NonHDL: 91.5
Total CHOL/HDL Ratio: 2
Triglycerides: 78 mg/dL (ref 0.0–149.0)
VLDL: 15.6 mg/dL (ref 0.0–40.0)

## 2020-03-26 NOTE — Progress Notes (Signed)
Patient here for nurse visit BP check per order from Dr. Darrick Huntsman   Patient reports compliance with prescribed BP medications: no, stopped taking Metoprolol.   Last dose of BP medication:   BP Readings from Last 3 Encounters:  02/18/20 128/82  02/04/20 122/74  01/23/20 (!) 144/70   Pulse Readings from Last 3 Encounters:  02/18/20 94  02/04/20 71  01/23/20 77    Per Dr.Tullo, Resume the Metoprolol medication for BP.    Patient verbalized understanding of instructions.   Allean Found, CMA

## 2020-03-28 LAB — PHENOBARBITAL LEVEL: Phenobarbital, Serum: 9.8 mg/L — ABNORMAL LOW (ref 15.0–40.0)

## 2020-03-28 NOTE — Progress Notes (Signed)
Your labs are normal,  but your vitamin d level is increasing too much .  Please suspend your supplementation for a month and let me know much you were taking  Regards,   Deborra Medina, MD

## 2020-03-29 ENCOUNTER — Other Ambulatory Visit: Payer: Self-pay | Admitting: Internal Medicine

## 2020-04-01 ENCOUNTER — Other Ambulatory Visit: Payer: Self-pay | Admitting: Internal Medicine

## 2020-04-01 NOTE — Telephone Encounter (Signed)
Patient is requesting rx for Zanaflex be sent for 90 days. Rx was sent in 03/30/20

## 2020-04-02 ENCOUNTER — Other Ambulatory Visit: Payer: Self-pay | Admitting: Internal Medicine

## 2020-04-30 ENCOUNTER — Encounter: Payer: Self-pay | Admitting: Internal Medicine

## 2020-04-30 ENCOUNTER — Other Ambulatory Visit: Payer: Self-pay

## 2020-04-30 ENCOUNTER — Telehealth (INDEPENDENT_AMBULATORY_CARE_PROVIDER_SITE_OTHER): Payer: Medicare Other | Admitting: Internal Medicine

## 2020-04-30 VITALS — Ht 66.5 in | Wt 135.0 lb

## 2020-04-30 DIAGNOSIS — R21 Rash and other nonspecific skin eruption: Secondary | ICD-10-CM | POA: Diagnosis not present

## 2020-04-30 DIAGNOSIS — L509 Urticaria, unspecified: Secondary | ICD-10-CM

## 2020-04-30 NOTE — Progress Notes (Signed)
Virtual Visit via Video Note  I connected with Barbara Odom  on 04/30/20 at 10:45 AM EST by a video enabled telemedicine application and verified that I am speaking with the correct person using two identifiers.  Location patient: renal home [facility] Location provider:work or home office Persons participating in the virtual visit: patient, provider  I discussed the limitations of evaluation and management by telemedicine and the availability of in person appointments. The patient expressed understanding and agreed to proceed.   HPI: 1. 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 Rolland Bimler placed on toprol xl 25 mg qd and lipitor had rash 03/05/20 on neck shoulders and low back itchy so she stopped all meds including vitamins and used clobetasol given by dermatology for eczema 03/26/20 seen and not taking BB but resumed this for HTN 03/28/20 and had pneumonia shot. She had a rash again in same areas 04/16/20 with was red,itchy blotchy shoulders, neck and stomach tried clobetasol which helped. Rash started with petechiae then went large diffuse to pimples to scabbed which resolved after clobetasol and had blister develop on right lower lip as well which was painful  She also tried zyrtec during the rash and benadryl prn   ROS: See pertinent positives and negatives per HPI.  Past Medical History:  Diagnosis Date  . Bronchiectasis ([place])   . 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 History of diverticulitis of colon   . Hyperlipidemia   . MAI (mycobacterium avium-intracellulare) ([place])   . Migraine headache   . Osteopenia   . Pneumonia    multi-lobe   . seasonal rhinitis     Past Surgical History:  Procedure Laterality Date  . BREAST BIOPSY Right 2005   Negative- clip placed  . BREAST EXCISIONAL BIOPSY Left 1997   Negative --- Pt states this procedure was a lumpect.  Marland Kitchen BREAST SURGERY     lumpectomy, benign   . CARPAL TUNNEL  RELEASE  1983   bilateral   . CERVICAL DISCECTOMY  August 2012   [place]     Current Outpatient Medications:  .  aspirin EC 81 MG tablet, Take 81 mg by mouth daily. Swallow whole., Disp: , Rfl:  .  atorvastatin (LIPITOR) 20 MG tablet, Take 1 tablet (20 mg total) by mouth daily., Disp: 90 tablet, Rfl: 3 .  Biotin 5000 MCG CAPS, Take 1 capsule by mouth. Daily, Disp: , Rfl:  .  cephALEXin (KEFLEX) 500 MG capsule, Take 1 capsule (500 mg total) by mouth 4 (four) times daily., Disp: 28 capsule, Rfl: 0 .  cetirizine (ZYRTEC) 10 MG tablet, Take 10 mg by mouth daily., Disp: , Rfl:  .  Cholecalciferol (VITAMIN D-3 PO), Take 2,000 Units by mouth., Disp: , Rfl:  .  Cranberry-Vitamin C (CRANBERRY CONCENTRATE/VITAMINC PO), Take 4,200 mg by mouth daily., Disp: , Rfl:  .  GLUCOSAMINE-CHONDROITIN PO, Take 1 tablet by mouth daily., Disp: , Rfl:  .  Magnesium 400 MG CAPS, Take 1 capsule by mouth daily., Disp: , Rfl:  .  metoprolol succinate (TOPROL-XL) 25 MG 24 hr tablet, Take 1 tablet (25 mg total) by mouth daily., Disp: 90 tablet, Rfl: 3 .  Multiple Vitamin (MULTIVITAMIN) tablet, Take 1 tablet by mouth daily., Disp: , Rfl:  .  Multiple Vitamins-Minerals (PRESERVISION AREDS 2 PO), Take 1 capsule by mouth., Disp: , Rfl:  .  ondansetron (ZOFRAN-ODT) 8 MG disintegrating tablet, TAKE 1 TABLET EVERY  6 HOURS AS NEEDED FOR NAUSEA AND VOMITING, Disp: 30 tablet, Rfl: 2 .  PHENObarbital (LUMINAL) 60 MG tablet, TAKE 1 TABLET BY MOUTH EVERY DAY, Disp: 90 tablet, Rfl: 1 .  Probiotic Product (SOLUBLE FIBER/PROBIOTICS PO), Take 1 capsule by mouth daily., Disp: , Rfl:  .  rizatriptan (MAXALT) 10 MG tablet, TAKE 1 TABLET BY MOUTH AS NEEDED FOR MIGRAINE. MAY REPEAT IN 2 HOURS IF NEEDED, Disp: 9 tablet, Rfl: 7 .  tiZANidine (ZANAFLEX) 4 MG tablet, TAKE 1 TABLET BY MOUTH EVERY 6 HOURS AS NEEDED FOR MUSCLE SPASMS., Disp: 360 tablet, Rfl: 1 .  umeclidinium-vilanterol (ANORO ELLIPTA) 62.5-25 MCG/INH AEPB, , Disp: , Rfl:  .  vitamin  B-12 (CYANOCOBALAMIN) 1000 MCG tablet, Take 1,000 mcg by mouth daily., Disp: , Rfl:  .  zolpidem (AMBIEN) 10 MG tablet, TAKE 1 TABLET BY MOUTH AT BEDTIME AS NEEDED FOR SLEEP., Disp: 30 tablet, Rfl: 5  EXAM:  VITALS per patient if applicable:  GENERAL: alert, oriented, appears well and in no acute distress  HEENT: atraumatic, conjunttiva clear, no obvious abnormalities on inspection of external nose and ears  NECK: normal movements of the head and neck  LUNGS: on inspection no signs of respiratory distress, breathing rate appears normal, no obvious gross SOB, gasping or wheezing  CV: no obvious cyanosis  MS: moves all visible extremities without noticeable abnormality  PSYCH/NEURO: pleasant and cooperative, no obvious depression or anxiety, speech and thought processing grossly intact  Skin: rash -see my chart photo  ASSESSMENT AND PLAN:  Discussed the following assessment and plan:  Urticaria  Rash ? If lipitor or toprol 25 mg xl related  Advised resume BB for now for 2 weeks and my chart if rash develops hold statin for now If rash does not develop introduce statin and if rash develops my chart [place] If rash returns resume prn clobetasol not for face and prn zyrtec or benadryl  If rash due to BB for HTN may need to change to ARB or CCB low dose  -will CC PCP Also discussed consider testing with allergy for medication allergies in future  Pt to take photos if rash comes back  Can use otc anbesol for blister in mouth resolving  -we discussed possible serious and likely etiologies, options for evaluation and workup, limitations of telemedicine visit vs in person visit, treatment, treatment risks and precautions.  I discussed the assessment and treatment plan with the patient. The patient was provided an opportunity to ask questions and all were answered. The patient agreed with the plan and demonstrated an understanding of the instructions.    Time spent 20 min Delorise Jackson, MD

## 2020-04-30 NOTE — Progress Notes (Signed)
Rash was on the shoulders, neck, and lower back.  Only new thing Patient has done is the metoprolol.

## 2020-05-06 ENCOUNTER — Encounter: Payer: Self-pay | Admitting: Internal Medicine

## 2020-05-06 ENCOUNTER — Other Ambulatory Visit: Payer: Self-pay | Admitting: Internal Medicine

## 2020-05-11 ENCOUNTER — Telehealth: Payer: Self-pay | Admitting: Pulmonary Disease

## 2020-05-11 NOTE — Telephone Encounter (Signed)
LG patient, last seen 02/04/2020. Patient reports of chest tightness, prod cough with green sputum, temp of 100.2, wheezing and voice horsiness. She is concerned that she will develop PNA.  Patient is in [place] visiting her daughter, and her daughters household is also sick.  Neg home covid test.  I have recommended covid test and UC for imaging. She voiced her understanding and had no further questions.  Nothing further needed at this time.   Routing to Dr. Patsey Berthold as an Juluis [place]ier.

## 2020-05-13 ENCOUNTER — Telehealth: Payer: Self-pay | Admitting: Pulmonary Disease

## 2020-05-13 MED ORDER — METHYLPREDNISOLONE 4 MG PO TBPK: ORAL_TABLET | ORAL | 0 refills | Status: DC

## 2020-05-13 NOTE — Telephone Encounter (Signed)
Patient is aware of below recommendations. She voiced her understanding.  Rx for medrol taper pack has been sent to preferred pharmacy. Nothing further needed.

## 2020-05-13 NOTE — Telephone Encounter (Signed)
We can do a Medrol taper pack have to be Medrol because she has difficulties with prednisone.  It would be the 4 mg tablet.  Believe is still 21 tablets.  You can ask her which pharmacy to send it to.  If she is no better in 2 days she needs to be seen either in the emergency room or urgent care.

## 2020-05-13 NOTE — Telephone Encounter (Signed)
Please refer to 05/11/2020 phone note.  Patient dis not go to urgent care as instructed. She started zpak that was given to her as a backup.  She will complete zpak Thursday. Sx have improved, however she is still experiencing chest tightness and dry cough. Denied f/c/s or additional sx.   She is using Anoro daily. She has used albuterol once since feeling unwell. She has not used it more due to it causing her to be shake.  She would like a medrol dose pack. Patient is currently in [place] visiting her daughter,  Dr. Patsey Berthold, please advise. Thanks

## 2020-05-13 NOTE — Telephone Encounter (Signed)
Noted  

## 2020-05-29 ENCOUNTER — Telehealth: Payer: Self-pay | Admitting: Pulmonary Disease

## 2020-05-29 MED ORDER — ANORO ELLIPTA 62.5-25 MCG/INH IN AEPB: 1.0000 | INHALATION_SPRAY | Freq: Every day | RESPIRATORY_TRACT | 6 refills | Status: DC

## 2020-05-29 NOTE — Telephone Encounter (Signed)
We can do televisit or actually provide her a 39-month refill of Anoro.  Forward records to a pulmonologist of her choice.  She does need to get with one ASAP as she does have frequent issues.

## 2020-05-29 NOTE — Telephone Encounter (Signed)
During the televisit she can actually ask for a pulmonologist referral.  This should be acceptable to the insurance.

## 2020-05-29 NOTE — Telephone Encounter (Signed)
Called and spoke with patient to let her know that Dr. Patsey Berthold was ok with Televisit if needed and to send in prescription to preferred pharmacy for 6 months. Patient states that due to her insurance she has a Welcome visit in May to establish with PCP and then doesn't have physical appointment until November and then she can get referral to Pulmonologist. So it may be the first of 2023 before she has a Technical brewer. Rx has been sent in for patient. Televisit made for her with Dr. Darnell Level on 07/30/20. Nothing further needed at this time.

## 2020-05-29 NOTE — Telephone Encounter (Signed)
Called and spoke with patient who states [place] Dr[place] Dr. Patsey Berthold said she'd be her pulmonologist until she got a new Dr. Patient states that she can not get in with a pulmonologist until she is establish with a PCP and she is waiting for that appointment.  Patient is asking if she needs to do a televisit or video visit every 6 months for her to refill Anoro?  Dr. Patsey Berthold please advise

## 2020-07-09 ENCOUNTER — Encounter: Payer: Self-pay | Admitting: Pulmonary Disease

## 2020-07-17 ENCOUNTER — Other Ambulatory Visit: Payer: Self-pay | Admitting: Internal Medicine

## 2020-07-17 MED ORDER — TIZANIDINE HCL 4 MG PO TABS: 4.0000 mg | ORAL_TABLET | Freq: Four times a day (QID) | ORAL | 1 refills | Status: DC | PRN

## 2020-07-30 ENCOUNTER — Ambulatory Visit (INDEPENDENT_AMBULATORY_CARE_PROVIDER_SITE_OTHER): Payer: Medicare Other | Admitting: Pulmonary Disease

## 2020-07-30 ENCOUNTER — Other Ambulatory Visit: Payer: Self-pay

## 2020-07-30 DIAGNOSIS — J4540 Moderate persistent asthma, uncomplicated: Secondary | ICD-10-CM | POA: Diagnosis not present

## 2020-07-30 DIAGNOSIS — J479 Bronchiectasis, uncomplicated: Secondary | ICD-10-CM | POA: Diagnosis not present

## 2020-07-30 NOTE — Progress Notes (Signed)
Subjective:    Patient ID: Barbara Odom, female    DOB: 10/09/1950, 69 y.o.   MRN: [id number] ~~~~~~~~~~~~~~~~~~~~~~~~~~~~~~~~~~~~~~~~~~~~~~~~~~~~~~~~~ Virtual Visit Via Telephone Note:   This visit type was conducted due to national recommendations for restrictions regarding the COVID-19 pandemic .  This format is felt to be most appropriate for this patient at this time.  All issues noted in this document were discussed and addressed.  No physical exam was performed (except for noted visual exam findings with Video Visits).    I connected with Barbara Odom by telephone at 11:45 AM and verified that I was speaking with the correct person using two identifiers. Location patient: home Location provider: [facility] Persons participating in the virtual visit: patient, physician   I discussed the limitations, risks, security and privacy concerns of performing an evaluation and management service by telephone and the availability of in person appointments. The patient expressed understanding and agreed to proceed. ~~~~~~~~~~~~~~~~~~~~~~~~~~~~~~~~~~~~~~~~~~~~~~~~~~~~~~~~~ Requesting MD/Service:  Dr. Deborra Medina Date of initial consultation: 03/03/17 by Dr. Gwendolyn Lima Reason for consultation: recent pneumonia, suspected asthma   PT PROFILE: 70 y.o. female with minimal smoking history (age [age]) hospitalized briefly in July 2018 with dx of PNA. Reports persistent "wheezing" since then.    PROBLEMS: Mild RML, lingular bronchiectasis with tendency for exacerbations   DATA: CTA chest 10/07/16: Ill-defined opacities in RML, lingula, LUL and infrahilar region of RLL.  These opacities have a nodularity to them. CT chest 01/20/17: Overall, markedly improved opacities compared to prior study.  However, there remains minimal opacities and possible mild bronchiectasis in RML and lingula PFTs 12/05/17: Spirometry normal, lung volumes normal, DLCO normal, flow volume loop  normal CT chest 02/06/18: Slight interval progression in patchy peribronchovascular nodularity, consolidation/volume loss and mild bronchiectasis, findings most indicative of an atypical infectious process such as mycobacterium avium complex   INTERVAL: Last seen by me   02/04/2020.  She has relocated to the [facility] area but has not been able to procure pulmonologist.  She is in the process to establish pulmonologist.  Only 1 exacerbation since that visit.  HPI Barbara Odom is a 70 year old lifelong never smoker with a history of bronchiectasis and chronic asthmatic bronchitis whom we are following up via telephone today.  She has been unable to establish with a pulmonologist due to the practices being backlogged with referrals.  She states that she is likely to get an appointment soon.  The purpose of this visit is to refill medications.  She has not been able to use inhaled corticosteroids due to recurrent issues with thrush no matter what the preparation is and no matter how careful she is with oral care.  She had an exacerbation of bronchiectasis in February but has not had any issues since then.  I have advised her that I will not be able to continue to follow her remotely and she will need to establish with a pulmonologist in her area.  She understands this.  She has not had any recent fevers, chills or sweats.  No other issues noted.   Review of Systems A 10 point review of systems was performed and it is as noted above otherwise negative.  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 09/06/2018   Hepatic steatosis 01/20/2017   Aortic atherosclerosis ([place]) 01/20/2017  Fatigue 12/17/2016   Abnormal EKG 10/08/2016   History of hematuria 11/16/2014   Encounter for  preventive health examination 11/16/2014   Atypical chest pain 09/25/2013   Bronchitis, chronic obstructive w acute bronchitis ([place]) 06/08/2013   Lumbago without sciatica 02/26/2011   Seizure disorder ([place])    Migraine headache    History of diverticulitis of colon    Osteopenia    Screening for colon cancer 02/23/2011   Screening for cervical cancer 02/23/2011   DISC DISEASE, CERVICAL 03/02/2007   ALLERGIC RHINITIS 03/01/2007   DIVERTICULOSIS, COLON 03/01/2007   Social History   Tobacco Use   Smoking status: Never   Smokeless tobacco: Never  Substance Use Topics   Alcohol use: Yes    Alcohol/week: 2.0 standard drinks    Types: 1 Cans of beer, 1 Shots of liquor per week   Allergies  Allergen Reactions   Penicillins Other (See Comments)    Reaction occurred as a child; "mom described something that sounds like bronchospasms"   Metoprolol Rash   Current medications reviewed with patient.     Objective:   Physical Exam   Due to the nature of the visit, no physical exam was performed.  Patient did not exhibit conversational dyspnea during the visit.     Assessment & Plan:     ICD-10-CM   1. Bronchiectasis without complication ([place])  R00.7    Appears to be stable Continue pulmonary hygiene    2. Moderate persistent asthma without complication  M22.63    Continue Anoro and as needed albuterol Patient intolerant of ICS and poorly tolerant of systemic steroids Has not met criteria for Biologics     We will see the patient on an as needed basis if she is back in the area.  I recommend that she get established with a pulmonary physician in her area.  She is currently in [facility].  Total visit non-face-to-face time, 18 minutes.  Renold Don, MD [facility]   *This note was dictated using voice recognition software/Dragon.  Despite best efforts to proofread, errors can occur which can change the meaning.  Any change was purely unintentional.

## 2020-08-27 ENCOUNTER — Encounter: Payer: Self-pay | Admitting: Pulmonary Disease

## 2020-08-27 NOTE — Patient Instructions (Signed)
Continue Anoro and as needed albuterol  You do need to get a pulmonary physician in your area.

## 2020-09-22 ENCOUNTER — Other Ambulatory Visit: Payer: Self-pay | Admitting: Internal Medicine

## 2020-09-22 DIAGNOSIS — G40909 Epilepsy, unspecified, not intractable, without status epilepticus: Secondary | ICD-10-CM

## 2020-09-22 NOTE — Telephone Encounter (Signed)
Refilled: 03/19/2020 Last OV: 02/18/2020 Next OV: 02/23/2021

## 2020-09-25 ENCOUNTER — Other Ambulatory Visit: Payer: Self-pay | Admitting: Internal Medicine

## 2020-09-25 DIAGNOSIS — M79671 Pain in right foot: Secondary | ICD-10-CM

## 2020-11-19 ENCOUNTER — Other Ambulatory Visit: Payer: Self-pay | Admitting: Internal Medicine

## 2020-11-19 NOTE — Telephone Encounter (Signed)
RX Refill:ambien Last Seen:02-18-20 Last ordered:03-03-20

## 2020-12-22 ENCOUNTER — Other Ambulatory Visit: Payer: Self-pay | Admitting: Internal Medicine

## 2020-12-22 MED ORDER — PREDNISONE 10 MG PO TABS: ORAL_TABLET | ORAL | 0 refills | Status: DC

## 2020-12-22 MED ORDER — LEVOFLOXACIN 500 MG PO TABS: 500.0000 mg | ORAL_TABLET | Freq: Every day | ORAL | 0 refills | Status: AC

## 2020-12-22 NOTE — Progress Notes (Signed)
Levaquin and prednisone taper sent to [facility] for continued purulent sputum  despite doxycycline

## 2021-01-08 ENCOUNTER — Ambulatory Visit (INDEPENDENT_AMBULATORY_CARE_PROVIDER_SITE_OTHER): Payer: Medicare Other

## 2021-01-08 ENCOUNTER — Other Ambulatory Visit: Payer: Self-pay | Admitting: Internal Medicine

## 2021-01-08 VITALS — Ht 66.5 in | Wt 135.0 lb

## 2021-01-08 DIAGNOSIS — Z0000 Encounter for general adult medical examination without abnormal findings: Secondary | ICD-10-CM | POA: Diagnosis not present

## 2021-01-08 NOTE — Progress Notes (Addendum)
Subjective:   Barbara Odom is a 70 y.o. female who presents for Medicare Annual (Subsequent) preventive examination.  Review of Systems    No ROS.  Medicare Wellness Virtual Visit.  Visual/audio telehealth visit, UTA vital signs.   See social history for additional risk factors.   Cardiac Risk Factors include: advanced age (>15men, >69 women)     Objective:    Today's Vitals   01/08/21 1317  Weight: 135 lb (61.2 kg)  Height: 5' 6.5" (1.689 m)   Body mass index is 21.46 kg/m.  Advanced Directives 01/08/2021 01/08/2020 12/20/2019 01/07/2019 09/29/2017 10/07/2016  Does Patient Have a Medical Advance Directive? Yes Yes Yes Yes No No  Type of Advance Directive - [place];Living will [place];Living will [place];Living will - -  Does patient want to make changes to medical advance directive? No - Patient declined - - No - Patient declined - -  Copy of [place] in Chart? - No - copy requested - No - copy requested - -  Would patient like information on creating a medical advance directive? - - - - - No - Patient declined    Current Medications (verified) Outpatient Encounter Medications as of 01/08/2021  Medication Sig   aspirin EC 81 MG tablet Take 81 mg by mouth daily. Swallow whole.   atorvastatin (LIPITOR) 20 MG tablet Take 1 tablet (20 mg total) by mouth daily.   Baclofen 5 MG TABS Take by mouth.   Biotin 5000 MCG CAPS Take 1 capsule by mouth. Daily   celecoxib (CELEBREX) 100 MG capsule Take by mouth.   cetirizine (ZYRTEC) 10 MG tablet Take 10 mg by mouth daily.   Cholecalciferol (VITAMIN D-3 PO) Take 2,000 Units by mouth.   Cranberry-Vitamin C (CRANBERRY CONCENTRATE/VITAMINC PO) Take 4,200 mg by mouth daily.   gabapentin (NEURONTIN) 300 MG capsule Take 1 capsule by mouth 3 (three) times daily.   Magnesium 400 MG CAPS Take 1 capsule by mouth daily.   Multiple Vitamin (MULTIVITAMIN) tablet  Take 1 tablet by mouth daily.   Multiple Vitamins-Minerals (PRESERVISION AREDS 2 PO) Take 1 capsule by mouth.   ondansetron (ZOFRAN-ODT) 8 MG disintegrating tablet TAKE 1 TABLET EVERY 6 HOURS AS NEEDED FOR NAUSEA AND VOMITING   oxyCODONE (OXY IR/ROXICODONE) 5 MG immediate release tablet PLEASE SEE ATTACHED FOR DETAILED DIRECTIONS   PHENObarbital (LUMINAL) 60 MG tablet TAKE 1 TABLET BY MOUTH EVERY DAY   Probiotic Product (SOLUBLE FIBER/PROBIOTICS PO) Take 1 capsule by mouth daily.   rizatriptan (MAXALT) 10 MG tablet TAKE 1 TABLET BY MOUTH AS NEEDED FOR MIGRAINE. MAY REPEAT IN 2 HOURS IF NEEDED   umeclidinium-vilanterol (ANORO ELLIPTA) 62.5-25 MCG/INH AEPB Inhale 1 puff into the lungs daily.   vitamin B-12 (CYANOCOBALAMIN) 1000 MCG tablet Take 1,000 mcg by mouth daily.   zolpidem (AMBIEN) 10 MG tablet TAKE 1 TABLET BY MOUTH EVERY DAY AT BEDTIME AS NEEDED FOR SLEEP   Cholecalciferol 50 MCG (2000 UT) TABS Take by mouth.   [DISCONTINUED] cephALEXin (KEFLEX) 500 MG capsule Take 1 capsule (500 mg total) by mouth 4 (four) times daily.   [DISCONTINUED] GLUCOSAMINE-CHONDROITIN PO Take 1 tablet by mouth daily.   [DISCONTINUED] methylPREDNISolone (MEDROL DOSEPAK) 4 MG TBPK tablet Use as directed   [DISCONTINUED] predniSONE (DELTASONE) 10 MG tablet 6 tablets on Day 1 , then reduce by 1 tablet daily until gone   [DISCONTINUED] tiZANidine (ZANAFLEX) 4 MG tablet Take 1 tablet (4 mg total) by  mouth every 6 (six) hours as needed for muscle spasms.   No facility-administered encounter medications on file as of 01/08/2021.    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   BREAST SURGERY     lumpectomy, benign    CARPAL TUNNEL RELEASE  1983   bilateral    CERVICAL DISCECTOMY  August 2012   [place]   Family History  Problem Relation Age of Onset   Heart disease Mother        Atrial fibrillation   Coronary artery disease Mother    Cancer Maternal Grandmother        colon Ca   Cancer Maternal Grandfather        Colon CA   Macular degeneration Father    Congestive Heart Failure Father    Breast cancer Neg Hx    Social History   Socioeconomic History   Marital status: Married    Spouse name: Not on file   Number of children: Not on file   Years of education: Not on file   Highest education level: Not on file  Occupational History   Occupation: BEHAVIOR MED    Employer: [employer]  Tobacco Use   Smoking status: Never   Smokeless tobacco: Never  Vaping Use   Vaping Use: Never used  Substance and Sexual Activity   Alcohol use: Yes    Alcohol/week: 2.0 standard drinks    Types: 1 Cans of beer, 1 Shots of liquor per week   Drug use: No   Sexual activity: Yes    Partners: Male    Comment: 1st intercourse- 40, partners- 25, married- 60 yrs   Other Topics Concern   Not on file  Social History Narrative   Not on file   Social Determinants of Health   Financial Resource Strain: Not on file  Food Insecurity: No Food Insecurity   Worried About Charity fundraiser in the Last Year: Never true   [place] in the Last Year: Never true  Transportation Needs: No Transportation Needs   Lack of Transportation (Medical): No   Lack of Transportation (Non-Medical): No  Physical Activity: Sufficiently Active   Days of Exercise per Week: 5 days   Minutes of Exercise per Session: 60 min  Stress:  No Stress Concern Present   Feeling of Stress : Not at all  Social Connections: Unknown   Frequency of Communication with Friends and Family: Not on file   Frequency of Social Gatherings with Friends and Family: Not on file   Attends Religious Services: Not on file   Active Member of Clubs or Organizations: Not on file   Attends Archivist Meetings: Not on file   Marital Status: Married    Tobacco Counseling Counseling given: Not Answered   Clinical Intake:  Pre-visit preparation completed: Yes  Diabetes: No  How often do you need to have someone help you when you read instructions, pamphlets, or other written materials from your doctor or pharmacy?: 1 - Never    Interpreter Needed?: No      Activities of Daily Living In your present state of health, do you have any difficulty performing the following activities: 01/08/2021  Hearing? N  Vision? N  Difficulty concentrating or making decisions? N  Walking or climbing stairs? N  Dressing or bathing? N  Doing errands, shopping? N  Preparing Food and eating ? N  Using the Toilet? N  In the past six months, have you accidently leaked urine? N  Do you have problems with loss of bowel control? N  Managing your Medications? N  Managing your Finances? N  Housekeeping or managing your Housekeeping? N  Some recent data might be hidden    Patient Care Team: Crecencio Mc, MD as PCP - General (Internal Medicine)  Indicate any recent Medical Services you may have received from other than [facility] providers in the past year (date may be approximate).     Assessment:   This is a routine wellness examination for Oto.  I connected with Brayli today by telephone and verified that I am speaking with the correct person using two identifiers. Location patient: home Location provider: work Persons participating in the virtual visit: patient, Marine scientist.    I discussed the limitations, risks, security and  privacy concerns of performing an evaluation and management service by telephone and the availability of in person appointments. The patient expressed understanding and verbally consented to this telephonic visit.    Interactive audio and video telecommunications were attempted between this provider and patient, however failed, due to patient having technical difficulties OR patient did not have access to video capability.  We continued and completed visit with audio only.  Some vital signs may be absent or patient reported.   Hearing/Vision screen Hearing Screening - Comments:: Patient is able to hear conversational tones without difficulty.  No issues reported. Vision Screening - Comments:: Wears corrective lenses  They have seen their ophthalmologist in the last 12 months.   Dietary issues and exercise activities discussed: Current Exercise Habits: Home exercise routine, Type of exercise: walking;stretching, Intensity: Mild Healthy diet Good water intake   Goals Addressed             This Visit's Progress    I want to start Yoga exercise again         Depression Screen PHQ 2/9 Scores 01/08/2021 01/08/2020 07/09/2019 06/13/2019 01/07/2019 11/16/2014 03/24/2014  PHQ - 2 Score 0 0 0 0 0 0 0  Some encounter information is confidential and restricted. Go to Review Flowsheets activity to see all data.    Fall Risk Fall Risk  01/08/2021 04/30/2020 02/18/2020 01/08/2020 12/24/2019  Falls in the past year? 0 0 0 0 0  Number falls in past yr: - 0 - 0 -  Injury with Fall? - 0 - - -  Follow up Falls evaluation completed Falls evaluation completed Falls evaluation completed Falls evaluation completed Falls evaluation completed    [place]: Adequate lighting in your home to reduce risk of falls? Yes   ASSISTIVE DEVICES UTILIZED TO PREVENT FALLS: Life alert? No  Use of a cane, [place] or w/c? No   TIMED UP AND GO: Was the test performed? No . Unsteady  gait  Cognitive Function:  Patient is alert and oriented x3.  6CIT Screen 01/07/2019  What Year? 0 points  What month? 0 points  What time? 0 points  Count back from 20 0 points  Months in reverse 0 points  Repeat phrase 0 points  Total Score 0    Immunizations Immunization History  Administered Date(s) Administered   Fluad Quad(high Dose 65+) 01/02/2019, 01/10/2020   Influenza Split 02/05/2012, 12/19/2012   PFIZER(Purple Top)SARS-COV-2 Vaccination 05/03/2019, 05/28/2019, 01/13/2020, 08/06/2020   Pneumococcal Conjugate-13 02/26/2019   Pneumococcal Polysaccharide-23 03/26/2020   Tdap 01/23/2013   Zoster Recombinat (Shingrix) 01/29/2019, 04/24/2020   Screening Tests Health Maintenance  Topic Date Due   COVID-19 Vaccine (5 - Booster for Pfizer series) 01/24/2021 (Originally 10/01/2020)   URINE MICROALBUMIN  02/23/2021 (Originally 09/25/1960)   MAMMOGRAM  02/19/2021   COLONOSCOPY (Pts 45-75yrs Insurance coverage will need to be confirmed)  03/06/2022   TETANUS/TDAP  01/24/2023   Pneumonia Vaccine 8+ Years old  Completed   INFLUENZA VACCINE  Completed   DEXA SCAN  Completed   Hepatitis C Screening  Completed   Zoster Vaccines- Shingrix  Completed   HPV VACCINES  Aged Out    Health Maintenance  There are no preventive care reminders to display for this patient.  Colonoscopy- deferred. Patient plans to schedule later in the season.   Mammogram- deferred. Patient requests referral sent by pcp after year yearly exam.  Lung Cancer Screening: (Low Dose CT Chest recommended if Age 46-80 years, 30 pack-year currently smoking OR have quit w/in 15years.) does not qualify.   Vision Screening: Recommended annual ophthalmology exams for early detection of glaucoma and other disorders of the eye.  Dental Screening: Recommended annual dental exams for proper oral hygiene  Community Resource Referral / Chronic Care Management: CRR required this visit?  No   CCM required this  visit?  No      Plan:   Keep all routine maintenance appointments.   I have personally reviewed and noted the following in the patient's chart:   Medical and social history Use of alcohol, tobacco or illicit drugs  Current medications and supplements including opioid prescriptions. Taking opioid. Followed by Dr. Elayne Guerin. Functional ability and status Nutritional status Physical activity Advanced directives List of other physicians Hospitalizations, surgeries, and ER visits in previous 12 months Vitals Screenings to include cognitive, depression, and falls Referrals and appointments  In addition, I have reviewed and discussed with patient certain preventive protocols, quality metrics, and best practice recommendations. A written personalized care plan for preventive services as well as general preventive health recommendations were provided to patient.     OBrien-Blaney, Crew Goren L, LPN   08/19/5613      I have reviewed the above information and agree with above.   Deborra Medina, MD

## 2021-01-08 NOTE — Patient Instructions (Addendum)
Barbara Odom , Thank you for taking time to come for your Medicare Wellness Visit. I appreciate your ongoing commitment to your health goals. Please review the following plan we discussed and let me know if I can assist you in the future.   These are the goals we discussed:  Goals      I want to start Yoga exercise again        This is a list of the screening recommended for you and due dates:  Health Maintenance  Topic Date Due   COVID-19 Vaccine (5 - Booster for Pfizer series) 01/24/2021*   Urine Protein Check  02/23/2021*   Mammogram  02/19/2021   Colon Cancer Screening  03/06/2022   Tetanus Vaccine  01/24/2023   Pneumonia Vaccine  Completed   Flu Shot  Completed   DEXA scan (bone density measurement)  Completed   Hepatitis C Screening: USPSTF Recommendation to screen - Ages 23-79 yo.  Completed   Zoster (Shingles) Vaccine  Completed   HPV Vaccine  Aged Out  *Topic was postponed. The date shown is not the original due date.    Conditions/risks identified: none new  Next appointment: Follow up in one year for your annual wellness visit    Preventive Care 65 Years and Older, Female Preventive care refers to lifestyle choices and visits with your health care provider that can promote health and wellness. What does preventive care include? A yearly physical exam. This is also called an annual well check. Dental exams once or twice a year. Routine eye exams. Ask your health care provider how often you should have your eyes checked. Personal lifestyle choices, including: Daily care of your teeth and gums. Regular physical activity. Eating a healthy diet. Avoiding tobacco and drug use. Limiting alcohol use. Practicing safe sex. Taking low-dose aspirin every day. Taking vitamin and mineral supplements as recommended by your health care provider. What happens during an annual well check? The services and screenings done by your health care provider during your annual well  check will depend on your age, overall health, lifestyle risk factors, and family history of disease. Counseling  Your health care provider may ask you questions about your: Alcohol use. Tobacco use. Drug use. Emotional well-being. Home and relationship well-being. Sexual activity. Eating habits. History of falls. Memory and ability to understand (cognition). Work and work Statistician. Reproductive health. Screening  You may have the following tests or measurements: Height, weight, and BMI. Blood pressure. Lipid and cholesterol levels. These may be checked every 5 years, or more frequently if you are over 47 years old. Skin check. Lung cancer screening. You may have this screening every year starting at age 27 if you have a 30-pack-year history of smoking and currently smoke or have quit within the past 15 years. Fecal occult blood test (FOBT) of the stool. You may have this test every year starting at age 64. Flexible sigmoidoscopy or colonoscopy. You may have a sigmoidoscopy every 5 years or a colonoscopy every 10 years starting at age 48. Hepatitis C blood test. Hepatitis B blood test. Sexually transmitted disease (STD) testing. Diabetes screening. This is done by checking your blood sugar (glucose) after you have not eaten for a while (fasting). You may have this done every 1-3 years. Bone density scan. This is done to screen for osteoporosis. You may have this done starting at age 43. Mammogram. This may be done every 1-2 years. Talk to your health care provider about how often you should have  regular mammograms. Talk with your health care provider about your test results, treatment options, and if necessary, the need for more tests. Vaccines  Your health care provider may recommend certain vaccines, such as: Influenza vaccine. This is recommended every year. Tetanus, diphtheria, and acellular pertussis (Tdap, Td) vaccine. You may need a Td booster every 10 years. Zoster  vaccine. You may need this after age 53. Pneumococcal 13-valent conjugate (PCV13) vaccine. One dose is recommended after age 32. Pneumococcal polysaccharide (PPSV23) vaccine. One dose is recommended after age 37. Talk to your health care provider about which screenings and vaccines you need and how often you need them. This information is not intended to replace advice given to you by your health care provider. Make sure you discuss any questions you have with your health care provider. Document Released: 04/03/2015 Document Revised: 11/25/2015 Document Reviewed: 01/06/2015 Elsevier Interactive Patient Education  2017 [place] Prevention in the Home Falls can cause injuries. They can happen to people of all ages. There are many things you can do to make your home safe and to help prevent falls. What can I do on the outside of my home? Regularly fix the edges of walkways and driveways and fix any cracks. Remove anything that might make you trip as you walk through a door, such as a raised step or threshold. Trim any bushes or trees on the path to your home. Use bright outdoor lighting. Clear any walking paths of anything that might make someone trip, such as rocks or tools. Regularly check to see if handrails are loose or broken. Make sure that both sides of any steps have handrails. Any raised decks and porches should have guardrails on the edges. Have any leaves, snow, or ice cleared regularly. Use sand or salt on walking paths during winter. Clean up any spills in your garage right away. This includes oil or grease spills. What can I do in the bathroom? Use night lights. Install grab bars by the toilet and in the tub and shower. Do not use towel bars as grab bars. Use non-skid mats or decals in the tub or shower. If you need to sit down in the shower, use a plastic, non-slip stool. Keep the floor dry. Clean up any water that spills on the floor as soon as it happens. Remove  soap buildup in the tub or shower regularly. Attach bath mats securely with double-sided non-slip rug tape. Do not have throw rugs and other things on the floor that can make you trip. What can I do in the bedroom? Use night lights. Make sure that you have a light by your bed that is easy to reach. Do not use any sheets or blankets that are too big for your bed. They should not hang down onto the floor. Have a firm chair that has side arms. You can use this for support while you get dressed. Do not have throw rugs and other things on the floor that can make you trip. What can I do in the kitchen? Clean up any spills right away. Avoid walking on wet floors. Keep items that you use a lot in easy-to-reach places. If you need to reach something above you, use a strong step stool that has a grab bar. Keep electrical cords out of the way. Do not use floor polish or wax that makes floors slippery. If you must use wax, use non-skid floor wax. Do not have throw rugs and other things on the floor that  can make you trip. What can I do with my stairs? Do not leave any items on the stairs. Make sure that there are handrails on both sides of the stairs and use them. Fix handrails that are broken or loose. Make sure that handrails are as long as the stairways. Check any carpeting to make sure that it is firmly attached to the stairs. Fix any carpet that is loose or worn. Avoid having throw rugs at the top or bottom of the stairs. If you do have throw rugs, attach them to the floor with carpet tape. Make sure that you have a light switch at the top of the stairs and the bottom of the stairs. If you do not have them, ask someone to add them for you. What else can I do to help prevent falls? Wear shoes that: Do not have high heels. Have rubber bottoms. Are comfortable and fit you well. Are closed at the toe. Do not wear sandals. If you use a stepladder: Make sure that it is fully opened. Do not climb a  closed stepladder. Make sure that both sides of the stepladder are locked into place. Ask someone to hold it for you, if possible. Clearly mark and make sure that you can see: Any grab bars or handrails. First and last steps. Where the edge of each step is. Use tools that help you move around (mobility aids) if they are needed. These include: Canes. Walkers. Scooters. Crutches. Turn on the lights when you go into a dark area. Replace any light bulbs as soon as they burn out. Set up your furniture so you have a clear path. Avoid moving your furniture around. If any of your floors are uneven, fix them. If there are any pets around you, be aware of where they are. Review your medicines with your doctor. Some medicines can make you feel dizzy. This can increase your chance of falling. Ask your doctor what other things that you can do to help prevent falls. This information is not intended to replace advice given to you by your health care provider. Make sure you discuss any questions you have with your health care provider. Document Released: 01/01/2009 Document Revised: 08/13/2015 Document Reviewed: 04/11/2014 Elsevier Interactive Patient Education  2017 [place].   Opioid Pain Medicine Management Opioids are powerful medicines that are used to treat moderate to severe pain. When used for short periods of time, they can help you to: Sleep better. Do better in physical or occupational therapy. Feel better in the first few days after an injury. Recover from surgery. Opioids should be taken with the supervision of a trained health care provider. They should be taken for the shortest period of time possible. This is because opioids can be addictive, and the longer you take opioids, the greater your risk of addiction. This addiction can also be called opioid use disorder. What are the risks? Using opioid pain medicines for longer than 3 days increases your risk of side effects. Side  effects include: Constipation. Nausea and vomiting. Breathing difficulties (respiratory depression). Drowsiness. Confusion. Opioid use disorder. Itching. Taking opioid pain medicine for a long period of time can affect your ability to do daily tasks. It also puts you at risk for: Motor vehicle crashes. Depression. Suicide. Heart attack. Overdose, which can be life-threatening. What is a pain treatment plan? A pain treatment plan is an agreement between you and your health care provider. Pain is unique to each person, and treatments vary depending on your  condition. To manage your pain, you and your health care provider need to work together. To help you do this: Discuss the goals of your treatment, including how much pain you might expect to have and how you will manage the pain. Review the risks and benefits of taking opioid medicines. Remember that a good treatment plan uses more than one approach and minimizes the chance of side effects. Be honest about the amount of medicines you take and about any drug or alcohol use. Get pain medicine prescriptions from only one health care provider. Pain can be managed with many types of alternative treatments. Ask your health care provider to refer you to one or more specialists who can help you manage pain through: Physical or occupational therapy. Counseling (cognitive behavioral therapy). Good nutrition. Biofeedback. Massage. Meditation. Non-opioid medicine. Following a gentle exercise program. How to use opioid pain medicine Taking medicine Take your pain medicine exactly as told by your health care provider. Take it only when you need it. If your pain gets less severe, you may take less than your prescribed dose if your health care provider approves. If you are not having pain, do nottake pain medicine unless your health care provider tells you to take it. If your pain is severe, do nottry to treat it yourself by taking more pills than  instructed on your prescription. Contact your health care provider for help. Write down the times when you take your pain medicine. It is easy to become confused while on pain medicine. Writing the time can help you avoid overdose. Take other over-the-counter or prescription medicines only as told by your health care provider. Keeping yourself and others safe  While you are taking opioid pain medicine: Do not drive, use machinery, or power tools. Do not sign legal documents. Do not drink alcohol. Do not take sleeping pills. Do not supervise children by yourself. Do not do activities that require climbing or being in high places. Do not go to a lake, river, ocean, spa, or swimming pool. Do not share your pain medicine with anyone. Keep pain medicine in a locked cabinet or in a secure area where pets and children cannot reach it. Stopping your use of opioids If you have been taking opioid medicine for more than a few weeks, you may need to slowly decrease (taper) how much you take until you stop completely. Tapering your use of opioids can decrease your risk of symptoms of withdrawal, such as: Pain and cramping in the abdomen. Nausea. Sweating. Sleepiness. Restlessness. Uncontrollable shaking (tremors). Cravings for the medicine. Do not attempt to taper your use of opioids on your own. Talk with your health care provider about how to do this. Your health care provider may prescribe a step-down schedule based on how much medicine you are taking and how long you have been taking it. Getting rid of leftover pills Do not save any leftover pills. Get rid of leftover pills safely by: Taking the medicine to a prescription take-back program. This is usually offered by the county or law enforcement. Bringing them to a pharmacy that has a drug disposal container. Flushing them down the toilet. Check the label or package insert of your medicine to see whether this is safe to do. Throwing them out in  the trash. Check the label or package insert of your medicine to see whether this is safe to do. If it is safe to throw it out, remove the medicine from the original container, put it into a sealable  bag or container, and mix it with used coffee grounds, food scraps, dirt, or cat litter before putting it in the trash. Follow these instructions at home: Activity Do exercises as told by your health care provider. Avoid activities that make your pain worse. Return to your normal activities as told by your health care provider. Ask your health care provider what activities are safe for you. General instructions You may need to take these actions to prevent or treat constipation: Drink enough fluid to keep your urine pale yellow. Take over-the-counter or prescription medicines. Eat foods that are high in fiber, such as beans, whole grains, and fresh fruits and vegetables. Limit foods that are high in fat and processed sugars, such as fried or sweet foods. Keep all follow-up visits. This is important. Where to find support If you have been taking opioids for a long time, you may benefit from receiving support for quitting from a local support group or counselor. Ask your health care provider for a referral to these resources in your area. Where to find more information Centers for Disease Control and Prevention (CDC): http://www.wolf.info/ [place] Food and Drug Administration (FDA): [place] Get help right away if: You may have taken too much of an opioid (overdosed). Common symptoms of an overdose: Your breathing is slower or more shallow than normal. You have a very slow heartbeat (pulse). You have slurred speech. You have nausea and vomiting. Your pupils become very small. You have other potential symptoms: You are very confused. You faint or feel like you will faint. You have cold, clammy skin. You have blue lips or fingernails. You have thoughts of harming yourself or harming others. These  symptoms may represent a serious problem that is an emergency. Do not wait to see if the symptoms will go away. Get medical help right away. Call your local emergency services (911 in the [place] Do not drive yourself to the hospital.  If you ever feel like you may hurt yourself or others, or have thoughts about taking your own life, get help right away. Go to your nearest emergency department or: Call your local emergency services (911 in the [place] Call the [facility] 339-556-3747 in the [place] Call a suicide crisis helpline, such as the [facility] at 937 128 3980. This is open 24 hours a day in the [place] Text the Crisis Text Line at 614-645-6632 (in the [place].). Summary Opioid medicines can help you manage moderate to severe pain for a short period of time. A pain treatment plan is an agreement between you and your health care provider. Discuss the goals of your treatment, including how much pain you might expect to have and how you will manage the pain. If you think that you or someone else may have taken too much of an opioid, get medical help right away. This information is not intended to replace advice given to you by your health care provider. Make sure you discuss any questions you have with your health care provider. Document Revised: 06/17/2020 Document Reviewed: 06/17/2020 Elsevier Patient Education  [place].

## 2021-01-15 ENCOUNTER — Telehealth: Payer: Self-pay | Admitting: *Deleted

## 2021-01-15 NOTE — Telephone Encounter (Signed)
Patient called asking when her last Tdap injection. Patient informed 01/2013 due in 10 year per chart. [place] to this practice due to this.

## 2021-01-26 ENCOUNTER — Telehealth: Payer: Self-pay

## 2021-01-26 NOTE — Telephone Encounter (Signed)
Hi there, Hope you are all well. The Lipitor gives me crazy muscle pain in my calf( I struggle to walk well in early am - muscles tight and painful non stop) as well as upper arm shoulder pain( tolerable)  . I have had this pain for months and would like some relief from it !  Question -could I be scheduled for lab work @130  pm dec 6 w my yearly follow up ( should I call to make a lab appt ?) and determine if I need to be on Lipitor and / or can I stop it now ( Lipitor holiday!!)and re evaluate another medication at visit time ?   Thank you      Gerald Stabs [place]

## 2021-01-28 NOTE — Telephone Encounter (Signed)
Pt would like to know if she can stop the lipitor until her appt with you on 02/23/2021 because the medication is causing her muscle pain in her calf and upper are shoulder.

## 2021-01-29 ENCOUNTER — Other Ambulatory Visit: Payer: Self-pay

## 2021-01-29 DIAGNOSIS — E1169 Type 2 diabetes mellitus with other specified complication: Secondary | ICD-10-CM

## 2021-01-29 DIAGNOSIS — E785 Hyperlipidemia, unspecified: Secondary | ICD-10-CM

## 2021-01-29 NOTE — Telephone Encounter (Signed)
I have called patient & she knows to fast the day of her visit 12/6. I have ordered lipid panel for her to have done when she is in office.

## 2021-02-22 ENCOUNTER — Telehealth: Payer: Self-pay | Admitting: Internal Medicine

## 2021-02-22 NOTE — Telephone Encounter (Signed)
Spoke with pt and she stated that she will just have labs done at her appt time.

## 2021-02-22 NOTE — Telephone Encounter (Signed)
Pt has had a bunch of labs done by other providers is there any labs pt needs before her appt tomorrow?

## 2021-02-22 NOTE — Telephone Encounter (Signed)
Will the patient need labs prior to appt on 02/23/21? If so, she wants labs put in on today to have labs done in the morning before her appointment tomorrow afternoon.

## 2021-02-23 ENCOUNTER — Ambulatory Visit (INDEPENDENT_AMBULATORY_CARE_PROVIDER_SITE_OTHER): Payer: Medicare Other | Admitting: Internal Medicine

## 2021-02-23 ENCOUNTER — Encounter: Payer: Self-pay | Admitting: Internal Medicine

## 2021-02-23 ENCOUNTER — Other Ambulatory Visit: Payer: Self-pay

## 2021-02-23 VITALS — BP 122/68 | HR 83 | Temp 96.3°F | Ht 66.5 in | Wt 138.4 lb

## 2021-02-23 DIAGNOSIS — I700 Atherosclerosis of aorta: Secondary | ICD-10-CM

## 2021-02-23 DIAGNOSIS — Z87448 Personal history of other diseases of urinary system: Secondary | ICD-10-CM | POA: Diagnosis not present

## 2021-02-23 DIAGNOSIS — R5383 Other fatigue: Secondary | ICD-10-CM

## 2021-02-23 DIAGNOSIS — M85852 Other specified disorders of bone density and structure, left thigh: Secondary | ICD-10-CM

## 2021-02-23 DIAGNOSIS — Z0000 Encounter for general adult medical examination without abnormal findings: Secondary | ICD-10-CM

## 2021-02-23 DIAGNOSIS — Z1231 Encounter for screening mammogram for malignant neoplasm of breast: Secondary | ICD-10-CM | POA: Diagnosis not present

## 2021-02-23 DIAGNOSIS — M654 Radial styloid tenosynovitis [de Quervain]: Secondary | ICD-10-CM

## 2021-02-23 DIAGNOSIS — G40909 Epilepsy, unspecified, not intractable, without status epilepticus: Secondary | ICD-10-CM

## 2021-02-23 DIAGNOSIS — E785 Hyperlipidemia, unspecified: Secondary | ICD-10-CM | POA: Diagnosis not present

## 2021-02-23 DIAGNOSIS — Z1211 Encounter for screening for malignant neoplasm of colon: Secondary | ICD-10-CM | POA: Diagnosis not present

## 2021-02-23 DIAGNOSIS — G8929 Other chronic pain: Secondary | ICD-10-CM

## 2021-02-23 DIAGNOSIS — M79644 Pain in right finger(s): Secondary | ICD-10-CM

## 2021-02-23 DIAGNOSIS — J479 Bronchiectasis, uncomplicated: Secondary | ICD-10-CM

## 2021-02-23 DIAGNOSIS — T466X5A Adverse effect of antihyperlipidemic and antiarteriosclerotic drugs, initial encounter: Secondary | ICD-10-CM

## 2021-02-23 DIAGNOSIS — M791 Myalgia, unspecified site: Secondary | ICD-10-CM

## 2021-02-23 MED ORDER — PREDNISONE 10 MG PO TABS: ORAL_TABLET | ORAL | 0 refills | Status: DC

## 2021-02-23 NOTE — Assessment & Plan Note (Signed)
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

## 2021-02-23 NOTE — Assessment & Plan Note (Signed)
statiin intolerant with lipitor  Willing to try crestor every other day

## 2021-02-23 NOTE — Assessment & Plan Note (Signed)
Managed since adolescence with phenobarbital.

## 2021-02-23 NOTE — Assessment & Plan Note (Signed)
Repeat lipids today after stopping lipitor due to statin myalgia

## 2021-02-23 NOTE — Assessment & Plan Note (Addendum)
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  She is using Anoro 3 days per week with good resutls and her new pulmonologist is Dr Darral Dash in [place].

## 2021-02-23 NOTE — Assessment & Plan Note (Signed)

## 2021-02-23 NOTE — Patient Instructions (Signed)
Your DEXA scan,  orthopedics referral have been ordered  If you do not hear from our office in a week,   Please let me know !

## 2021-02-23 NOTE — Progress Notes (Signed)
Patient ID: Barbara Odom, female    DOB: 1950/08/16  Age: 70 y.o. MRN: [id number]  The patient is here for annual preventive  examination and management of other chronic and acute problems.   The risk factors are reflected in the social history.  The roster of all physicians providing medical care to patient - is listed in the Snapshot section of the chart.  Activities of daily living:  The patient is 100% independent in all ADLs: dressing, toileting, feeding as well as independent mobility  Home safety : The patient has smoke detectors in the home. They wear seatbelts.  There are no firearms at home. There is no violence in the home.   There is no risks for hepatitis, STDs or HIV. There is no   history of blood transfusion. They have no travel history to infectious disease endemic areas of the world.  The patient has seen their dentist in the last six month. They have seen their eye doctor in the last year. They admit to slight hearing difficulty with regard to whispered voices and some television programs.  They have deferred audiologic testing in the last year.  They do not  have excessive sun exposure. Discussed the need for sun protection: hats, long sleeves and use of sunscreen if there is significant sun exposure.   Diet: the importance of a healthy diet is discussed. They do have a healthy diet.  The benefits of regular aerobic exercise were discussed. She walks 4 times per week ,  20 minutes.   Depression screen: there are no signs or vegative symptoms of depression- irritability, change in appetite, anhedonia, sadness/tearfullness.  Cognitive assessment: the patient manages all their financial and personal affairs and is actively engaged. They could relate day,date,year and events; recalled 2/3 objects at 3 minutes; performed clock-face test normally.  The following portions of the patient's history were reviewed and updated as appropriate: allergies, current medications, past  family history, past medical history,  past surgical history, past social history  and problem list.  Visual acuity was not assessed per patient preference since she has regular follow up with her ophthalmologist. Hearing and body mass index were assessed and reviewed.   During the course of the visit the patient was educated and counseled about appropriate screening and preventive services including : fall prevention , diabetes screening, nutrition counseling, colorectal cancer screening, and recommended immunizations.    CC: The primary encounter diagnosis was History of hematuria. Diagnoses of Hyperlipidemia, unspecified hyperlipidemia type, Encounter for screening mammogram for malignant neoplasm of breast, Osteopenia of neck of left femur, Myalgia due to statin, Acquired bronchiectasis ([place]), Other fatigue, Chronic pain of right thumb, De Quervain's tenosynovitis, bilateral, Aortic arch atherosclerosis ([place]), Screening for colon cancer, Seizure disorder ([place]), and Encounter for preventive health examination were also pertinent to this visit.  1) s/p 2 procedures :  L4-5 Lumbar interbody fusion and sciatic decompression  in August by yarbrough .   Walking 3 miles every other day alt wit 1.5 miles.  Feels good . Pain is now an "ache" that increases to 4-5/10 with prolonged standing.  Uses 800 motrin daily,  tolerates this better than celebrex.    2) Bronchiectasis: she has established care with a  pulmonologist in [facility])  . Using Anoro 2-3 days per week.  Doing fine.  Only 1 illness since moving.    3) bilateral hand pain :  since July,  a)  right thumb clicking and hurting.  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   She has a past surgical history that includes Carpal tunnel release (1983); Breast surgery; Cervical discectomy (August 2012); Breast excisional biopsy (Left, 1997); and Breast biopsy (Right, 2005).   Her family history includes Cancer in her maternal grandfather and maternal grandmother; Congestive Heart Failure in her father; Coronary artery disease in her mother; Heart disease in her mother; Macular degeneration in her father.She reports that she has never smoked. She has never used smokeless tobacco. She reports current alcohol use of about 2.0 standard drinks per week. She reports that she does not use drugs.  Outpatient Medications Prior to Visit  Medication Sig Dispense Refill   aspirin EC 81 MG tablet Take 81 mg by mouth daily. Swallow whole.     Baclofen 5 MG TABS Take by mouth.     Biotin 5000 MCG CAPS Take 1 capsule by mouth. Daily     cetirizine (ZYRTEC) 10 MG tablet Take 10 mg by mouth daily.     Cholecalciferol (VITAMIN D-3 PO) Take 2,000 Units by mouth.     Cranberry-Vitamin C (CRANBERRY CONCENTRATE/VITAMINC PO) Take 4,200 mg by mouth daily.     Magnesium 400 MG CAPS Take 1 capsule by mouth daily.     Multiple Vitamins-Minerals (PRESERVISION AREDS 2 PO) Take 1 capsule by mouth.     ondansetron (ZOFRAN-ODT) 8 MG disintegrating tablet TAKE 1 TABLET EVERY 6 HOURS AS NEEDED FOR NAUSEA AND VOMITING 30 tablet 2   PHENObarbital (LUMINAL) 60 MG tablet TAKE 1 TABLET BY MOUTH EVERY DAY 90 tablet 2   Probiotic Product (SOLUBLE FIBER/PROBIOTICS PO) Take 1 capsule by mouth daily.     rizatriptan (MAXALT) 10 MG tablet TAKE 1 TABLET BY MOUTH AS NEEDED FOR MIGRAINE. MAY REPEAT IN 2 HOURS IF NEEDED 9 tablet 4   umeclidinium-vilanterol (ANORO ELLIPTA)  62.5-25 MCG/INH AEPB Inhale 1 puff into the lungs daily. 60 each 6   vitamin B-12 (CYANOCOBALAMIN) 1000 MCG tablet Take 1,000 mcg by mouth daily.     zolpidem (AMBIEN) 10 MG tablet TAKE 1 TABLET BY MOUTH EVERY DAY AT BEDTIME AS NEEDED FOR SLEEP 30 tablet 5   oxyCODONE (OXY IR/ROXICODONE) 5 MG immediate release tablet PLEASE SEE ATTACHED FOR DETAILED DIRECTIONS (Patient not taking: Reported on 02/23/2021)     atorvastatin (LIPITOR) 20 MG tablet Take 1 tablet (20 mg total) by mouth daily. 90 tablet 3   Cholecalciferol 50 MCG (2000 UT) TABS Take by mouth.     gabapentin (NEURONTIN) 300 MG capsule Take 1 capsule by mouth 3 (three) times daily.     Multiple Vitamin (MULTIVITAMIN) tablet Take 1 tablet by mouth daily.     No facility-administered medications prior to visit.    Review of Systems  Patient denies headache, fevers, malaise, unintentional weight loss, skin rash, eye pain, sinus congestion and sinus pain, sore throat, dysphagia,  hemoptysis , cough, dyspnea, wheezing, chest pain, palpitations, orthopnea, edema, abdominal pain, nausea, melena, diarrhea, constipation, flank pain, dysuria, hematuria, urinary  Frequency, nocturia, numbness, tingling, seizures,  Focal weakness, Loss of consciousness,  Tremor, insomnia, depression, anxiety, and suicidal ideation.     Objective:  BP 122/68 (BP Location: Left Arm, Patient Position: Sitting, Cuff Size: Normal)   Pulse 83   Temp (!) 96.3 F (35.7 C) (Temporal)   Ht 5' 6.5" (1.689 m)   Wt 138 lb 6.4 oz (62.8 kg)   SpO2 98%   BMI 22.00 kg/m   Physical Exam   General appearance: alert, cooperative and appears stated age Ears: normal TM's and external ear canals both ears Throat: lips, mucosa, and tongue normal; teeth and gums normal Neck: no adenopathy, no carotid bruit, supple, symmetrical, trachea midline and thyroid not enlarged, symmetric, no tenderness/mass/nodules Back: symmetric, no curvature. ROM normal. No CVA tenderness. Lungs:  clear to auscultation bilaterally except for end inspiratory squeaks  Heart: regular rate and rhythm, S1, S2 normal, no murmur, click, rub or gallop Abdomen: soft, non-tender; bowel sounds normal; no masses,  no organomegaly Pulses: 2+ and symmetric Skin: Skin color, texture, turgor normal. No rashes or lesions Lymph nodes: Cervical, supraclavicular, and axillary nodes normal.   Assessment & Plan:   Problem List Items Addressed This Visit     Acquired bronchiectasis ([place])    Diagnosed by Dr Alva Garnet,  Presumed secondary to MAI , however her  Bronchoscopy was  postponed due to COVID pandemic .  She is using Anoro 3 days per week with good resutls and her new pulmonologist is Dr Darral Dash in [place].       Aortic arch atherosclerosis ([facility])    statiin intolerant with lipitor  Willing to try crestor every other day       Chronic thumb pain    Secondary to DJD with clicking and joint derangement noted on exam.  Referral to hand surgeon made       Relevant Medications   predniSONE (DELTASONE) 10 MG tablet   Other Relevant Orders   AMB referral to orthopedics   Encounter for preventive health examination    age appropriate education and counseling updated, referrals for preventative services and immunizations addressed, dietary and smoking counseling addressed, most recent labs reviewed.  I have personally reviewed and have noted:   1) the patient's medical and social history 2) The pt's use of alcohol, tobacco, and illicit drugs 3) The patient's current medications and supplements 4) Functional ability including ADL's, fall risk, home safety risk, hearing and visual impairment 5) Diet and physical activities 6) Evidence for depression or mood disorder 7) The patient's height, weight, and BMI have been recorded in the chart   I have made referrals, and provided counseling and education based on review of the above      Fatigue   Relevant Orders   CBC with Differential/Platelet    TSH   History of hematuria - Primary   Relevant Orders   Urinalysis, Routine w reflex microscopic   Myalgia due to statin    Repeat lipids today after stopping lipitor due to statin myalgia       Osteopenia    BMD has declined slightly over the past 10 years.  T score -2.3 in hip on 2020 DEXA . Repeat ordered       Relevant Orders   DG Bone Density   Screening for colon cancer    5 yr follow up on adenomatous polyps is due Dec 2024      Seizure disorder [facility])    Managed since adolescence with phenobarbital.        Other Visit Diagnoses     Hyperlipidemia,  unspecified hyperlipidemia type       Relevant Orders   Lipid Profile   Encounter for screening mammogram for malignant neoplasm of breast       De Quervain's tenosynovitis, bilateral       Relevant Orders   AMB referral to orthopedics      Meds ordered this encounter  Medications   predniSONE (DELTASONE) 10 MG tablet    Sig: 6 tablets on Day 1 , then reduce by 1 tablet daily until gone    Dispense:  21 tablet    Refill:  0    Medications Discontinued During This Encounter  Medication Reason   atorvastatin (LIPITOR) 20 MG tablet    Cholecalciferol 50 MCG (2000 UT) TABS    gabapentin (NEURONTIN) 300 MG capsule    Multiple Vitamin (MULTIVITAMIN) tablet     Follow-up: No follow-ups on file.   Crecencio Mc, MD

## 2021-02-23 NOTE — Assessment & Plan Note (Signed)
Secondary to DJD with clicking and joint derangement noted on exam.  Referral to hand surgeon made

## 2021-02-23 NOTE — Assessment & Plan Note (Signed)
BMD has declined slightly over the past 10 years.  T score -2.3 in hip on 2020 DEXA . Repeat ordered

## 2021-02-24 LAB — URINALYSIS, ROUTINE W REFLEX MICROSCOPIC
Bilirubin Urine: NEGATIVE
Hgb urine dipstick: NEGATIVE
Ketones, ur: NEGATIVE
Leukocytes,Ua: NEGATIVE
Nitrite: NEGATIVE
RBC / HPF: NONE SEEN (ref 0–?)
Specific Gravity, Urine: 1.025 (ref 1.000–1.030)
Total Protein, Urine: NEGATIVE
Urine Glucose: NEGATIVE
Urobilinogen, UA: 0.2 (ref 0.0–1.0)
pH: 6 (ref 5.0–8.0)

## 2021-02-24 LAB — LIPID PANEL
Cholesterol: 205 mg/dL — ABNORMAL HIGH (ref 0–200)
HDL: 75.2 mg/dL (ref >39.00)
LDL Cholesterol: 114 mg/dL — ABNORMAL HIGH (ref 0–99)
NonHDL: 129.68
Total CHOL/HDL Ratio: 3
Triglycerides: 76 mg/dL (ref 0.0–149.0)
VLDL: 15.2 mg/dL (ref 0.0–40.0)

## 2021-02-24 LAB — CBC WITH DIFFERENTIAL/PLATELET
Basophils Absolute: 0.1 10*3/uL (ref 0.0–0.1)
Basophils Relative: 1.1 % (ref 0.0–3.0)
Eosinophils Absolute: 0.4 10*3/uL (ref 0.0–0.7)
Eosinophils Relative: 7.1 % — ABNORMAL HIGH (ref 0.0–5.0)
HCT: 37.7 % (ref 36.0–46.0)
Hemoglobin: 12.5 g/dL (ref 12.0–15.0)
Lymphocytes Relative: 39.1 % (ref 12.0–46.0)
Lymphs Abs: 2 10*3/uL (ref 0.7–4.0)
MCHC: 33.3 g/dL (ref 30.0–36.0)
MCV: 88.4 fl (ref 78.0–100.0)
Monocytes Absolute: 0.3 10*3/uL (ref 0.1–1.0)
Monocytes Relative: 6.8 % (ref 3.0–12.0)
Neutro Abs: 2.3 10*3/uL (ref 1.4–7.7)
Neutrophils Relative %: 45.9 % (ref 43.0–77.0)
Platelets: 229 10*3/uL (ref 150.0–400.0)
RBC: 4.26 Mil/uL (ref 3.87–5.11)
RDW: 14.9 % (ref 11.5–15.5)
WBC: 5.1 10*3/uL (ref 4.0–10.5)

## 2021-02-24 LAB — TSH: TSH: 2.29 u[IU]/mL (ref 0.35–5.50)

## 2021-03-19 ENCOUNTER — Encounter: Payer: Self-pay | Admitting: Internal Medicine

## 2021-03-22 ENCOUNTER — Other Ambulatory Visit: Payer: Self-pay | Admitting: Internal Medicine

## 2021-03-22 DIAGNOSIS — G40909 Epilepsy, unspecified, not intractable, without status epilepticus: Secondary | ICD-10-CM

## 2021-03-22 DIAGNOSIS — M25512 Pain in left shoulder: Secondary | ICD-10-CM

## 2021-03-23 ENCOUNTER — Telehealth: Payer: Self-pay | Admitting: Internal Medicine

## 2021-03-23 DIAGNOSIS — M25512 Pain in left shoulder: Secondary | ICD-10-CM | POA: Insufficient documentation

## 2021-03-23 MED ORDER — TRAMADOL HCL 50 MG PO TABS: 50.0000 mg | ORAL_TABLET | Freq: Four times a day (QID) | ORAL | 0 refills | Status: AC | PRN

## 2021-03-23 MED ORDER — PREDNISONE 10 MG PO TABS: ORAL_TABLET | ORAL | 0 refills | Status: DC

## 2021-03-23 NOTE — Telephone Encounter (Signed)
Rasheedah,  Please refax the DEXA order to the phone number supplied by patient in the attached my chart message .   Pleas let her know when it has been done.   Thanks  TT

## 2021-03-23 NOTE — Telephone Encounter (Signed)
Refilled: 09/22/2020 Last OV: 02/23/2021 Next OV: not scheduled

## 2021-03-23 NOTE — Telephone Encounter (Signed)
Will get signed when Dr. Derrel Nip returns to the office.

## 2021-04-12 ENCOUNTER — Telehealth: Payer: Self-pay | Admitting: Internal Medicine

## 2021-04-12 NOTE — Telephone Encounter (Signed)
Rejection Reason - Patient was No Show" Virl Cagey said on Apr 12, 2021 2:35 PM  Pt appt was on 04/12/2021 at 02:30 pm  Msg from [facility]

## 2021-04-18 ENCOUNTER — Telehealth: Payer: Self-pay | Admitting: Internal Medicine

## 2021-04-18 DIAGNOSIS — M85851 Other specified disorders of bone density and structure, right thigh: Secondary | ICD-10-CM

## 2021-04-18 NOTE — Assessment & Plan Note (Signed)
Current risk of a major fracture is 20%

## 2021-04-28 IMAGING — MG DIGITAL SCREENING BILAT W/ TOMO W/ CAD
8 series · 9 of 24 positions shown · non-contrast
Comparison: Previous exam(s).

CLINICAL DATA: Screening.

EXAM:
DIGITAL SCREENING BILATERAL MAMMOGRAM WITH TOMO AND CAD

[L MLO synth-2D]
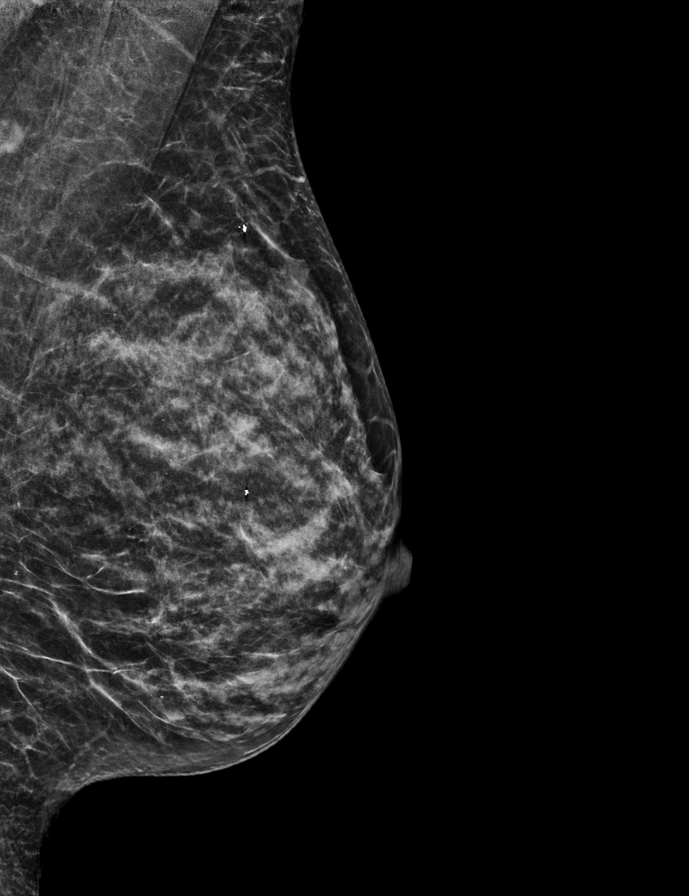

[R CC synth-2D]
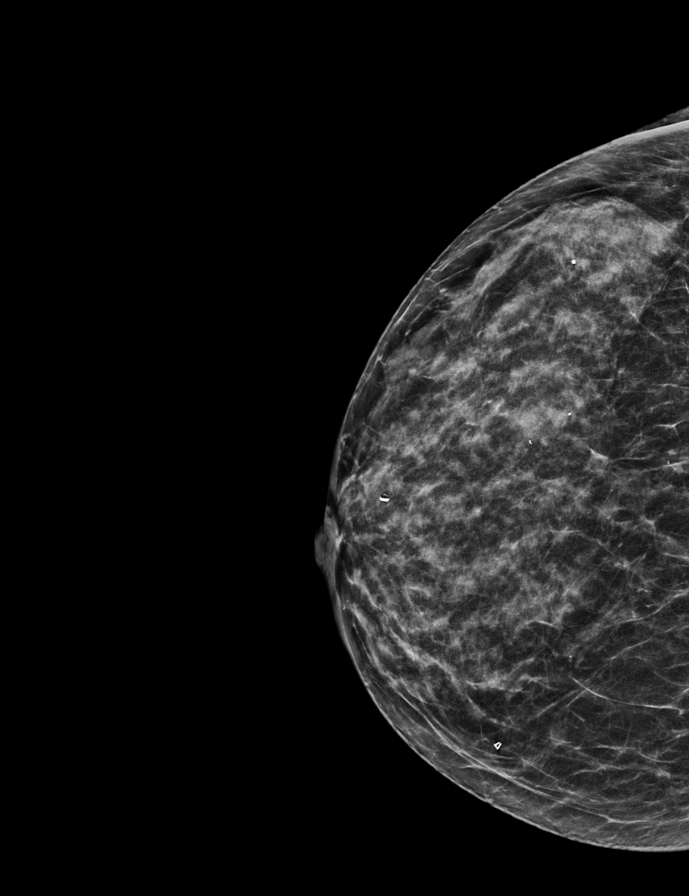

[R MLO synth-2D]
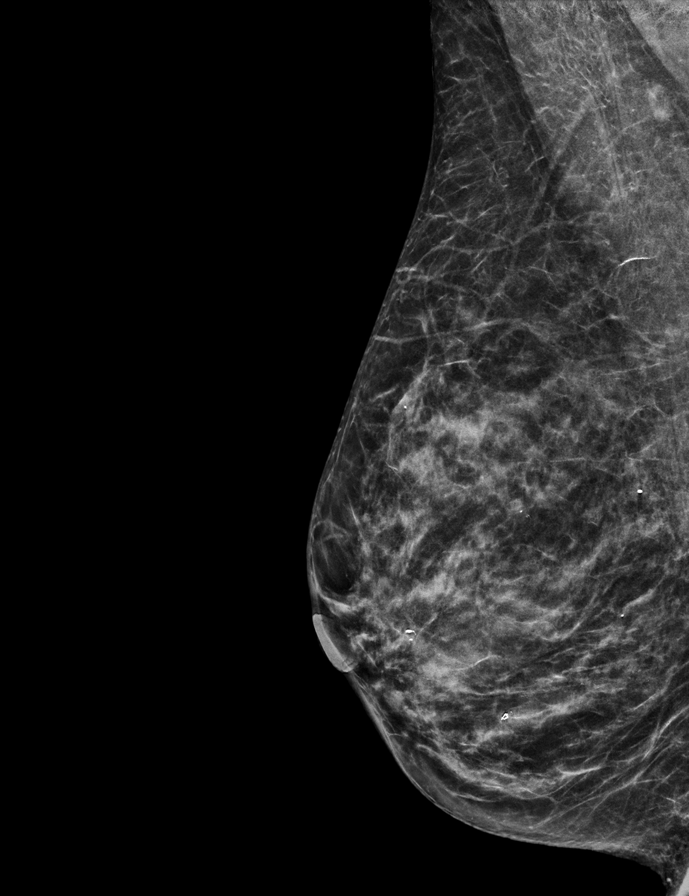

[L CC synth-2D]
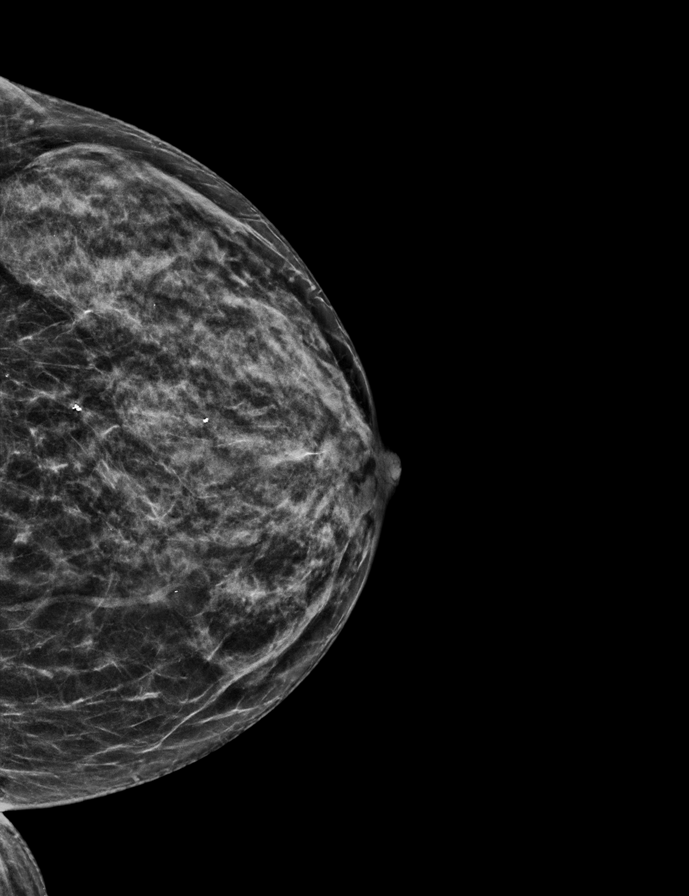

[L MLO tomo · 2 of 47 frames shown]
[frame 16/47]
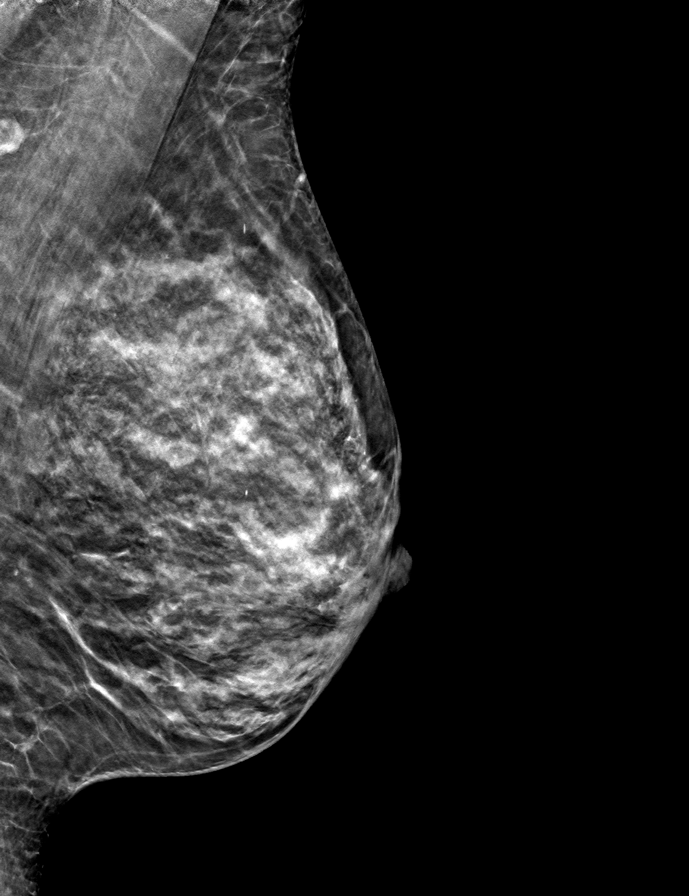
[frame 24/47]
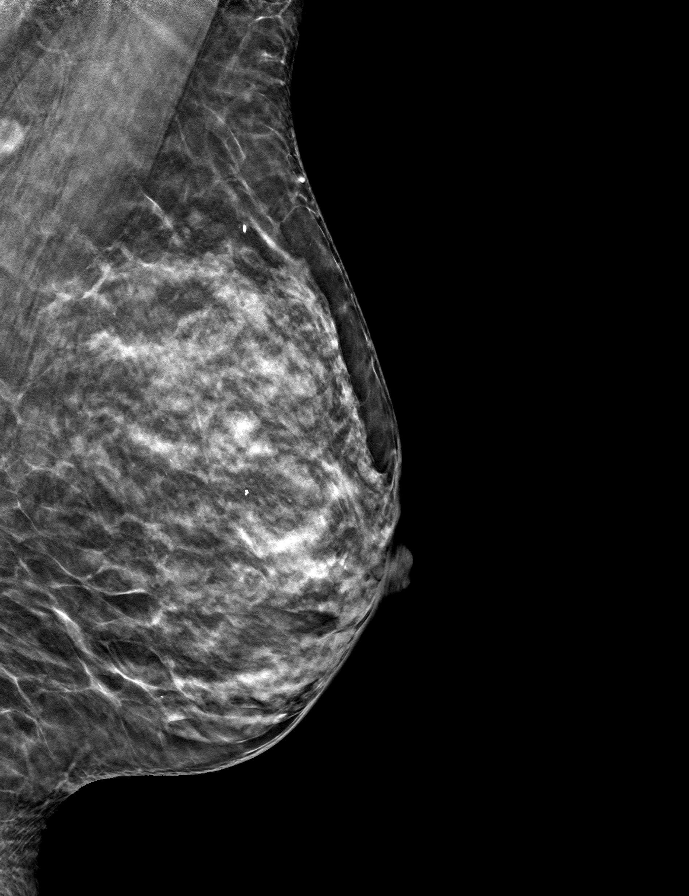

[R MLO tomo · tomo slice 26/51.0]
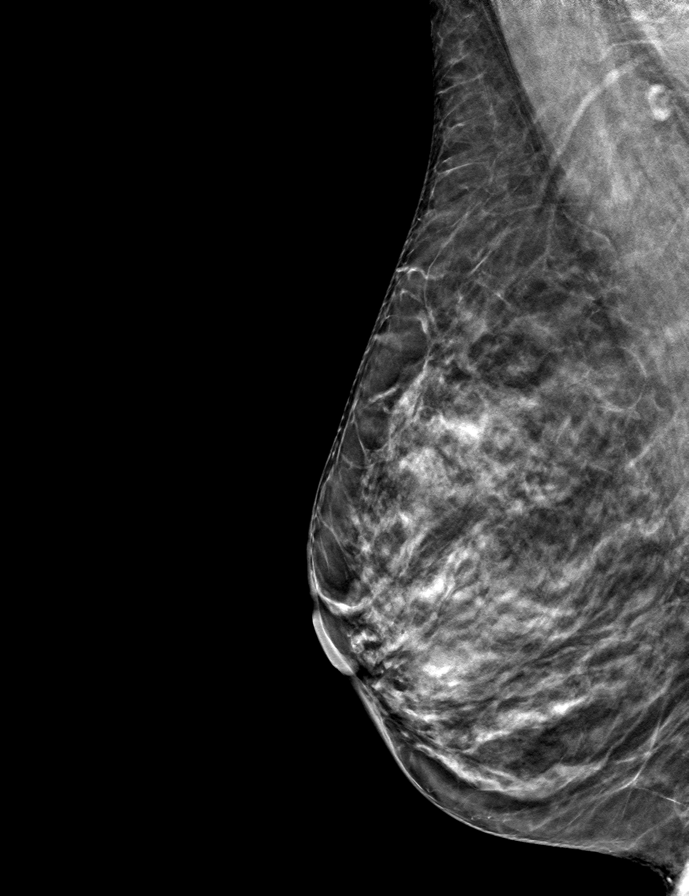

[R CC tomo · tomo slice 24/47.0]
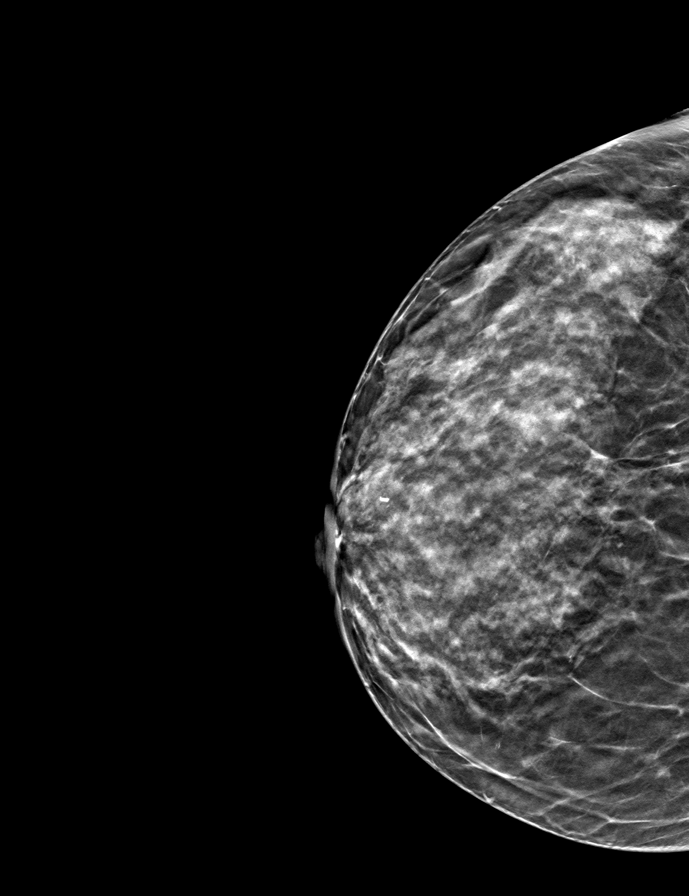

[L CC tomo · tomo slice 23/46.0]
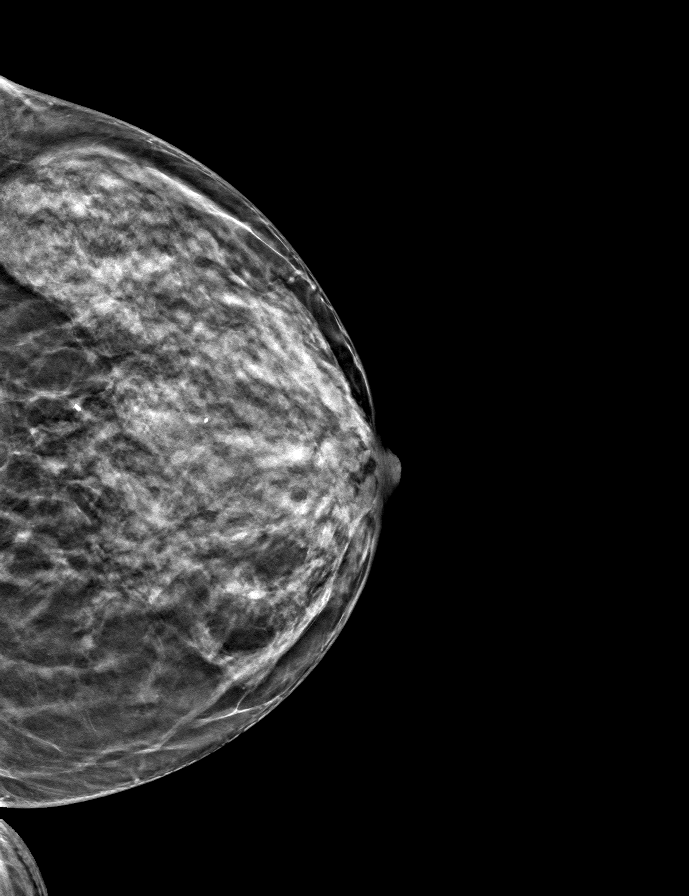

[9 of 24 positions shown; findings below may reference images not displayed]

ACR Breast Density Category c: The breast tissue is heterogeneously
dense, which may obscure small masses.
FINDINGS: There are no findings suspicious for malignancy. Images were
processed with CAD.
IMPRESSION: No mammographic evidence of malignancy. A result letter of this
screening mammogram will be mailed directly to the patient.

RECOMMENDATION:
Screening mammogram in one year. (Code:FT-U-LHB)

BI-RADS CATEGORY  1: Negative.

## 2021-05-05 ENCOUNTER — Ambulatory Visit (INDEPENDENT_AMBULATORY_CARE_PROVIDER_SITE_OTHER): Payer: Medicare Other | Admitting: Internal Medicine

## 2021-05-05 ENCOUNTER — Encounter: Payer: Self-pay | Admitting: Internal Medicine

## 2021-05-05 DIAGNOSIS — M85851 Other specified disorders of bone density and structure, right thigh: Secondary | ICD-10-CM | POA: Diagnosis not present

## 2021-05-05 DIAGNOSIS — M85852 Other specified disorders of bone density and structure, left thigh: Secondary | ICD-10-CM

## 2021-05-05 NOTE — Assessment & Plan Note (Signed)
Progressive,  With T score now -2.4  .  Discussed options of treatment.  Advised to reduce the calcium to 1200 mg daily since she is getting 600 mg through diet.  Discussed Evista .  She will review and decide after her return from [place]

## 2021-05-05 NOTE — Progress Notes (Signed)
Telephone Note  This visit type was conducted due to national recommendations for restrictions regarding the COVID-19 pandemic (e.g. social distancing).  This format is felt to be most appropriate for this patient at this time.  All issues noted in this document were discussed and addressed.  No physical exam was performed (except for noted visual exam findings with Video Visits).   I connected withNAME@ on 05/05/21 at  4:30 PM EST by telephone and verified that I am speaking with the correct person using two identifiers. Location patient: home Location provider: work or home office Persons participating in the virtual visit: patient, provider  I discussed the limitations, risks, security and privacy concerns of performing an evaluation and management service by telephone and the availability of in person appointments. I also discussed with the patient that there may be a patient responsible charge related to this service. The patient expressed understanding and agreed to proceed.  Reason for visit: follow up on DEXA results   Not  HPI:  71 yr old female  with bronchiectasis, cerivcal disk disease s/p fusion,  osteopenia,  presents for management of osteopenia .  She was treated remotely (over ten years ago)  with alendronate for one year by her GYN .  She No history of fractures, DVT/PE and is up to date on annual mammography. Walks 2-3 miles daily ,  has a healthy diet that contains dairy. Has started taking 2000 ius of D and recently increased her dietary calcium from 1200 mg calcium to 1800 mg daily   Recently evaluated  for left shoulder pain by [facility]:  diagnosed as potential rotator cuff, bursitis or nerve impingement . Has not started mobic due to concerns for gi toxicity  . Plan for steroid injection March 3.    ROS: See pertinent positives and negatives per HPI.  Past Medical History:  Diagnosis Date   Bronchiectasis ([place])    Calcium oxalate crystals in urine 06/13/2019    grand mal    adolescent, on low dose phenobarbital   History of diverticulitis of colon    Hyperlipidemia    MAI (mycobacterium avium-intracellulare) ([facility])    Migraine headache    Osteopenia    Pneumonia    multi-lobe    seasonal rhinitis     Past Surgical History:  Procedure Laterality Date   BREAST BIOPSY Right 2005   Negative- clip placed   BREAST EXCISIONAL BIOPSY Left 1997   Negative --- Pt states this procedure was a lumpect.   BREAST SURGERY     lumpectomy, benign    CARPAL TUNNEL RELEASE  1983   bilateral    CERVICAL DISCECTOMY  August 2012   [place]    Family History  Problem Relation Age of Onset   Heart disease Mother        Atrial fibrillation   Coronary artery disease Mother    Cancer Maternal Grandmother        colon Ca   Cancer Maternal Grandfather        Colon CA   Macular degeneration Father    Congestive Heart Failure Father    Breast cancer Neg Hx     SOCIAL HX:  reports that she has never smoked. She has never used smokeless tobacco. She reports current alcohol use of about 2.0 standard drinks per week. She reports that she does not use drugs.    Current Outpatient Medications:    aspirin EC 81 MG tablet, Take 81 mg by mouth daily. Swallow whole., Disp: ,  Rfl:    Baclofen 5 MG TABS, Take by mouth., Disp: , Rfl:    Biotin 5000 MCG CAPS, Take 1 capsule by mouth. Daily, Disp: , Rfl:    cetirizine (ZYRTEC) 10 MG tablet, Take 10 mg by mouth daily., Disp: , Rfl:    Cholecalciferol (VITAMIN D-3 PO), Take 2,000 Units by mouth., Disp: , Rfl:    Cranberry-Vitamin C (CRANBERRY CONCENTRATE/VITAMINC PO), Take 4,200 mg by mouth daily., Disp: , Rfl:    Magnesium 400 MG CAPS, Take 1 capsule by mouth daily., Disp: , Rfl:    meloxicam (MOBIC) 15 MG tablet, Take 15 mg by mouth daily., Disp: , Rfl:    Multiple Vitamins-Minerals (PRESERVISION AREDS 2 PO), Take 1 capsule by mouth., Disp: , Rfl:    ondansetron (ZOFRAN-ODT) 8 MG disintegrating tablet, TAKE 1 TABLET  EVERY 6 HOURS AS NEEDED FOR NAUSEA AND VOMITING, Disp: 30 tablet, Rfl: 2   PHENObarbital (LUMINAL) 60 MG tablet, TAKE 1 TABLET BY MOUTH EVERY DAY, Disp: 90 tablet, Rfl: 2   Probiotic Product (SOLUBLE FIBER/PROBIOTICS PO), Take 1 capsule by mouth daily., Disp: , Rfl:    rizatriptan (MAXALT) 10 MG tablet, TAKE 1 TABLET BY MOUTH AS NEEDED FOR MIGRAINE. MAY REPEAT IN 2 HOURS IF NEEDED, Disp: 9 tablet, Rfl: 4   umeclidinium-vilanterol (ANORO ELLIPTA) 62.5-25 MCG/INH AEPB, Inhale 1 puff into the lungs daily., Disp: 60 each, Rfl: 6   vitamin B-12 (CYANOCOBALAMIN) 1000 MCG tablet, Take 1,000 mcg by mouth daily., Disp: , Rfl:    zolpidem (AMBIEN) 10 MG tablet, TAKE 1 TABLET BY MOUTH EVERY DAY AT BEDTIME AS NEEDED FOR SLEEP, Disp: 30 tablet, Rfl: 5  EXAM:   General impression: alert, cooperative and articulate.  No signs of being in distress  Lungs: speech is fluent sentence length suggests that patient is not short of breath and not punctuated by cough, sneezing or sniffing. Marland Kitchen   Psych: affect normal.  speech is articulate and non pressured .  Denies suicidal thoughts   ASSESSMENT AND PLAN:  Discussed the following assessment and plan:  Osteopenia of necks of both femurs  Osteopenia Progressive,  With T score now -2.4  .  Discussed options of treatment.  Advised to reduce the calcium to 1200 mg daily since she is getting 600 mg through diet.  Discussed Evista .  She will review and decide after her return from [place]     I discussed the assessment and treatment plan with the patient. The patient was provided an opportunity to ask questions and all were answered. The patient agreed with the plan and demonstrated an understanding of the instructions.   The patient was advised to call back or seek an in-person evaluation if the symptoms worsen or if the condition fails to improve as anticipated.   I spent 20 minutes dedicated to the care of this patient on the date of this encounter to include  pre-visit review of his medical history,  non Face-to-face time with the patient , and post visit ordering of testing and therapeutics.    Crecencio Mc, MD

## 2021-05-18 LAB — HM PAP SMEAR: HM Pap smear: NORMAL

## 2021-06-03 LAB — HM MAMMOGRAPHY

## 2021-07-06 ENCOUNTER — Telehealth: Payer: Self-pay | Admitting: Internal Medicine

## 2021-07-06 ENCOUNTER — Encounter: Payer: Self-pay | Admitting: Internal Medicine

## 2021-07-06 ENCOUNTER — Telehealth (INDEPENDENT_AMBULATORY_CARE_PROVIDER_SITE_OTHER): Payer: Medicare Other | Admitting: Internal Medicine

## 2021-07-06 DIAGNOSIS — M85852 Other specified disorders of bone density and structure, left thigh: Secondary | ICD-10-CM

## 2021-07-06 DIAGNOSIS — M79644 Pain in right finger(s): Secondary | ICD-10-CM

## 2021-07-06 DIAGNOSIS — M7582 Other shoulder lesions, left shoulder: Secondary | ICD-10-CM | POA: Diagnosis not present

## 2021-07-06 DIAGNOSIS — J479 Bronchiectasis, uncomplicated: Secondary | ICD-10-CM

## 2021-07-06 DIAGNOSIS — M85851 Other specified disorders of bone density and structure, right thigh: Secondary | ICD-10-CM

## 2021-07-06 DIAGNOSIS — J0110 Acute frontal sinusitis, unspecified: Secondary | ICD-10-CM

## 2021-07-06 DIAGNOSIS — G8929 Other chronic pain: Secondary | ICD-10-CM

## 2021-07-06 MED ORDER — LEVOFLOXACIN 500 MG PO TABS: 500.0000 mg | ORAL_TABLET | Freq: Every day | ORAL | 0 refills | Status: AC

## 2021-07-06 MED ORDER — RALOXIFENE HCL 60 MG PO TABS: 60.0000 mg | ORAL_TABLET | Freq: Every day | ORAL | 5 refills | Status: DC

## 2021-07-06 MED ORDER — PREDNISONE 10 MG PO TABS: ORAL_TABLET | ORAL | 0 refills | Status: DC

## 2021-07-06 NOTE — Assessment & Plan Note (Signed)
Secondary to DJD with clicking and joint derangement noted on exam.  Referral to hand surgeon done ; pain has resolved.  ?

## 2021-07-06 NOTE — Progress Notes (Signed)
Virtual Visit via Caregility Note ? ?This visit type was conducted due to national recommendations for restrictions regarding the COVID-19 pandemic (e.g. social distancing).  This format is felt to be most appropriate for this patient at this time.  All issues noted in this document were discussed and addressed.  No physical exam was performed (except for noted visual exam findings with Video Visits).  ? ?I connected withNAME@ on 07/06/21 at 11:00 AM EDT by a video enabled telemedicine application and verified that I am speaking with the correct person using two identifiers. ?Location patient: home ?Location provider: work or home office ?Persons participating in the virtual visit: patient, provider ? ?I discussed the limitations, risks, security and privacy concerns of performing an evaluation and management service by telephone and the availability of in person appointments. I also discussed with the patient that there may be a patient responsible charge related to this service. The patient expressed understanding and agreed to proceed. ? ? ?Reason for visit: acute sinusitis  ? ?HPI: ? ?71 yr old female with history of allergic rhinitis managed with cetirizine and flonase,  chronic bronchitis with acquired bronchiectasis presents with 5 day history of sinus congestion , facial pain and pressure,   moderate to severe headache without neck pain,  ear fullness,  despite liberal use of decongestants and mucolytics, saline irrigation and steroid nasal spray .  Laryngitis started 2 days ago.  Denies cough, fever, body aches  ? ?Orthopedics follow up:  left shoulder injected with steroids for mgmt of rotator cuff tendonitis,  follow up in May.  Right thumb pain improved  ? ?Osteopeorosis:  ready to start Evista  ? ? ?ROS: See pertinent positives and negatives per HPI. ? ?Past Medical History:  ?Diagnosis Date  ? Bronchiectasis ([place])   ? 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 adolescent, on low dose  phenobarbital  ? History of diverticulitis of colon   ? Hyperlipidemia   ? MAI (mycobacterium avium-intracellulare) ([place])   ? Migraine headache   ? Osteopenia   ? Pneumonia   ? multi-lobe   ? seasonal rhinitis   ? ? ?Past Surgical History:  ?Procedure Laterality Date  ? BREAST BIOPSY Right 2005  ? Negative- clip placed  ? BREAST EXCISIONAL BIOPSY Left 1997  ? Negative --- Pt states this procedure was a lumpect.  ? BREAST SURGERY    ? lumpectomy, benign   ? CARPAL TUNNEL RELEASE  1983  ? bilateral   ? CERVICAL DISCECTOMY  August 2012  ? [facility]  ? ? ?Family History  ?Problem Relation Age of Onset  ? Heart disease Mother   ?     Atrial fibrillation  ? Coronary artery disease Mother   ? Cancer Maternal Grandmother   ?     colon Ca  ? Cancer Maternal Grandfather   ?     Colon CA  ? Macular degeneration Father   ? Congestive Heart Failure Father   ? Breast cancer Neg Hx   ? ? ?SOCIAL HX:  reports that she has never smoked. She has never used smokeless tobacco. She reports current alcohol use of about 2.0 standard drinks per week. She reports that she does not use drugs.  ? ? ?Current Outpatient Medications:  ?  aspirin EC 81 MG tablet, Take 81 mg by mouth daily. Swallow whole., Disp: , Rfl:  ?  Biotin 5000 MCG CAPS, Take 1 capsule by mouth. Daily, Disp: , Rfl:  ?  cetirizine (ZYRTEC) 10 MG tablet, Take 10 mg by mouth daily., Disp: , Rfl:  ?  Cholecalciferol (VITAMIN D-3 PO), Take 2,000 Units by mouth., Disp: , Rfl:  ?  Cranberry-Vitamin C (CRANBERRY CONCENTRATE/VITAMINC PO), Take 4,200 mg by mouth daily., Disp: , Rfl:  ?  levofloxacin (LEVAQUIN) 500 MG tablet, Take 1 tablet (500 mg total) by mouth daily for 7 days., Disp: 7 tablet, Rfl: 0 ?  Magnesium 400 MG CAPS, Take 1 capsule by mouth daily., Disp: , Rfl:  ?  Multiple Vitamins-Minerals (PRESERVISION AREDS 2 PO), Take 1 capsule by mouth., Disp: , Rfl:  ?  ondansetron (ZOFRAN-ODT) 8 MG disintegrating tablet, TAKE 1 TABLET EVERY 6 HOURS AS NEEDED FOR NAUSEA AND VOMITING,  Disp: 30 tablet, Rfl: 2 ?  PHENObarbital (LUMINAL) 60 MG tablet, TAKE 1 TABLET BY MOUTH EVERY DAY, Disp: 90 tablet, Rfl: 2 ?  predniSONE (DELTASONE) 10 MG tablet, 6 tablets on Day 1 , then reduce by 1 tablet daily until gone, Disp: 21 tablet, Rfl: 0 ?  Probiotic Product (SOLUBLE FIBER/PROBIOTICS PO), Take 1 capsule by mouth daily., Disp: , Rfl:  ?  raloxifene (EVISTA) 60 MG tablet, Take 1 tablet (60 mg total) by mouth daily., Disp: 30 tablet, Rfl: 5 ?  rizatriptan (MAXALT) 10 MG tablet, TAKE 1 TABLET BY MOUTH AS NEEDED FOR MIGRAINE. MAY REPEAT IN 2 HOURS IF NEEDED, Disp: 9 tablet, Rfl: 4 ?  umeclidinium-vilanterol (ANORO ELLIPTA) 62.5-25 MCG/INH AEPB, Inhale 1 puff into the lungs daily., Disp: 60 each, Rfl: 6 ?  vitamin B-12 (CYANOCOBALAMIN) 1000 MCG tablet, Take 1,000 mcg by mouth daily., Disp: , Rfl:  ?  zolpidem (AMBIEN) 10 MG tablet, TAKE 1 TABLET BY MOUTH EVERY DAY AT BEDTIME AS NEEDED FOR SLEEP, Disp: 30 tablet, Rfl: 5 ? ?EXAM: ? ?VITALS per patient if applicable: ? ?GENERAL: alert, oriented, appears well and in no acute distress ? ?HEENT: atraumatic, conjunttiva clear, no obvious abnormalities on inspection of external nose and ears ? ?NECK: normal movements of the head and neck ? ?LUNGS: on inspection no signs of respiratory distress, breathing rate appears normal, no obvious gross SOB, gasping or wheezing ? ?CV: no obvious cyanosis ? ?MS: moves all visible extremities without noticeable abnormality ? ?PSYCH/NEURO: pleasant and cooperative, no obvious depression or anxiety, speech and thought processing grossly intact ? ?ASSESSMENT AND PLAN: ? ?Discussed the following assessment and plan: ? ?Acquired bronchiectasis ([place]) ? ?Chronic pain of right thumb ? ?Rotator cuff tendonitis, left ? ?Acute non-recurrent frontal sinusitis ? ?Osteopenia of necks of both femurs ? ?Acquired bronchiectasis ([place]) ?Diagnosed by Dr  Now managed bu Dr Joaquim Nam in [place] and Dr Darral Dash in [place].   Presumed secondary to MAI  , however her  Bronchoscopy was  postponed due to [place] pandemic .  She is using Anoro 3 days per week with good resutls and her new pulmonologist is Dr Darral Dash in [place]. Has doxycycline on file for use ? 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  Referral to hand surgeon done ; pain has resolved.  ? ?Rotator cuff tendonitis, left ?Diagnosed and treated with I/A shoulder injection in March by [facility] ? ?Acute sinusitis ?Continue decongestants , antihistamines and irrigataion.  Adding levaquin (PCN allergy) and prednisone taper ? ?Osteopenia ?Progressive,  With T score now -2.4  .plans to start  Evista . \rx sent ? ?  ?I discussed the assessment and treatment plan with the patient. The patient was provided an opportunity to  ask questions and all were answered. The patient agreed with the plan and demonstrated an understanding of the instructions. ?  ?The patient was advised to call back or seek an in-person evaluation if the symptoms worsen or if the condition fails to improve as anticipated. ? ? ? ? ?Crecencio Mc, MD  ?

## 2021-07-06 NOTE — Assessment & Plan Note (Signed)
Diagnosed by Dr  Now managed bu Dr Joaquim Nam in [place] and Dr Darral Dash in [place].   Presumed secondary to MAI , however her  Bronchoscopy was  postponed due to [place] pandemic .  She is using Anoro 3 days per week with good resutls and her new pulmonologist is Dr Darral Dash in [place]. Has doxycycline on file for use ?

## 2021-07-06 NOTE — Telephone Encounter (Signed)
Please set up virtual visit 11:00  ?

## 2021-07-06 NOTE — Assessment & Plan Note (Signed)
Progressive,  With T score now -2.4  .plans to start  Evista . \rx sent ?

## 2021-07-06 NOTE — Assessment & Plan Note (Signed)
Diagnosed and treated with I/A shoulder injection in March by [facility] ?

## 2021-07-06 NOTE — Assessment & Plan Note (Signed)
Continue decongestants , antihistamines and irrigataion.  Adding levaquin (PCN allergy) and prednisone taper ?

## 2021-07-06 NOTE — Telephone Encounter (Signed)
Spoke with pt and confirmed with her that she is scheduled for today at 11 am.  Pt gave a verbal understanding.  ?

## 2021-08-13 ENCOUNTER — Encounter: Payer: Self-pay | Admitting: Internal Medicine

## 2021-09-26 ENCOUNTER — Other Ambulatory Visit: Payer: Self-pay | Admitting: Internal Medicine

## 2021-09-26 DIAGNOSIS — G40909 Epilepsy, unspecified, not intractable, without status epilepticus: Secondary | ICD-10-CM

## 2021-09-28 ENCOUNTER — Encounter: Payer: Self-pay | Admitting: Internal Medicine

## 2021-09-28 DIAGNOSIS — G40909 Epilepsy, unspecified, not intractable, without status epilepticus: Secondary | ICD-10-CM

## 2021-09-29 NOTE — Telephone Encounter (Signed)
Refilled: 09/27/2021 but pharmacy is stating that they did not receive the refill request.   Last OV: 07/06/2021 Next OV: not scheduled

## 2021-09-29 NOTE — Telephone Encounter (Signed)
Barbara Odom From [facility] pharmacy called in requesting refill on medication (PHENObarbital (LUMINAL) 60 MG tablet).... Medication to be sent to [place], [place]

## 2021-09-30 MED ORDER — PHENOBARBITAL 60 MG PO TABS: 60.0000 mg | ORAL_TABLET | Freq: Every day | ORAL | 3 refills | Status: DC

## 2021-10-05 ENCOUNTER — Other Ambulatory Visit: Payer: Self-pay | Admitting: Internal Medicine

## 2021-10-06 NOTE — Telephone Encounter (Signed)
Refilled: 11/19/20 Last OV: 05/05/21 Next OV: Not scheduled

## 2021-10-07 ENCOUNTER — Other Ambulatory Visit: Payer: Self-pay | Admitting: Neurosurgery

## 2021-10-07 MED ORDER — BACLOFEN 10 MG PO TABS: 10.0000 mg | ORAL_TABLET | Freq: Three times a day (TID) | ORAL | 0 refills | Status: AC | PRN

## 2021-10-27 ENCOUNTER — Other Ambulatory Visit: Payer: Self-pay | Admitting: Internal Medicine

## 2021-11-03 ENCOUNTER — Encounter: Payer: Self-pay | Admitting: Internal Medicine

## 2021-11-05 NOTE — Telephone Encounter (Signed)
[place] need to be evaluated at either UC or the ED to rule out possible blood clot. Will send a mychart message to pt since it is Friday evening and the office is closed till Monday.

## 2021-11-15 ENCOUNTER — Encounter: Payer: Self-pay | Admitting: Internal Medicine

## 2021-11-15 ENCOUNTER — Telehealth (INDEPENDENT_AMBULATORY_CARE_PROVIDER_SITE_OTHER): Payer: Medicare Other | Admitting: Internal Medicine

## 2021-11-15 VITALS — BP 136/62

## 2021-11-15 DIAGNOSIS — M85852 Other specified disorders of bone density and structure, left thigh: Secondary | ICD-10-CM | POA: Diagnosis not present

## 2021-11-15 DIAGNOSIS — M85851 Other specified disorders of bone density and structure, right thigh: Secondary | ICD-10-CM

## 2021-11-15 DIAGNOSIS — M79662 Pain in left lower leg: Secondary | ICD-10-CM | POA: Insufficient documentation

## 2021-11-15 NOTE — Assessment & Plan Note (Signed)
Pre test probability is moderate only because of use of Evista.  D Dimer ordered;;  If negative ,  DVT is unlikely.

## 2021-11-15 NOTE — Progress Notes (Unsigned)
Virtual Visit via [place]   Note    This format is felt to be most appropriate for this patient at this time.  All issues noted in this document were discussed and addressed.  No physical exam was performed (except for noted visual exam findings with Video Visits).   I connected with   Barbara Odom  on 11/16/21 at  4:30 PM EDT by a video enabled telemedicine application  and verified that I am speaking with the correct person using two identifiers. Location patient: home Location provider: work or home office Persons participating in the virtual visit: patient, provider  I discussed the limitations, risks, security and privacy concerns of performing an evaluation and management service by telephone and the availability of in person appointments. I also discussed with the patient that there may be a patient responsible charge related to this service. The patient expressed understanding and agreed to proceed.  Reason for visit: restart of Evista   HPI:  71 yr old female with history of varicose veins and osteoporosis managed recently with Evista presents after developing left calf pain and tenderness in the setting of recent  prolonged travel to [place] for an extended vacation. Visited family at 58 different cities. She was careful to stop every 2 hours and walk/stretch her legs and did quite a bit of walking at each destination . Symptoms of "left calf cramping began on August 7.  No swelling or redness but calf felt tender and slightly warm.  Stopped the Evista on August 15.  Sent mychart message on august 16 with these symptoms and was advised that she needed to have a DVT ruled out , which she did not do.  She has not resumed the Evista.  The calf still cramps but has not become swollen or red.    ROS: See pertinent positives and negatives per HPI.  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   BREAST SURGERY     lumpectomy, benign    CARPAL TUNNEL RELEASE  1983   bilateral    CERVICAL DISCECTOMY  August 2012   [facility]    Family History  Problem Relation Age of Onset   Heart disease Mother        Atrial fibrillation   Coronary artery disease Mother    Cancer Maternal Grandmother        colon Ca   Cancer Maternal Grandfather        Colon CA   Macular degeneration Father    Congestive Heart Failure Father    Breast cancer Neg Hx     SOCIAL HX:  reports that she has never smoked. She has never used smokeless tobacco. She reports current alcohol use of about 2.0 standard drinks of alcohol per week. She reports that she does not use drugs.    Current Outpatient Medications:    aspirin EC 81 MG tablet, Take 81  mg by mouth daily. Swallow whole., Disp: , Rfl:    Biotin 5000 MCG CAPS, Take 1 capsule by mouth. Daily, Disp: , Rfl:    Cholecalciferol (VITAMIN D-3 PO), Take 2,000 Units by mouth., Disp: , Rfl:    Cranberry-Vitamin C (CRANBERRY CONCENTRATE/VITAMINC PO), Take 4,200 mg by mouth daily., Disp: , Rfl:    Magnesium 400 MG CAPS, Take 1 capsule by mouth daily., Disp: , Rfl:    Multiple Vitamins-Minerals (PRESERVISION AREDS 2 PO), Take 1 capsule by mouth., Disp: , Rfl:    ondansetron (ZOFRAN-ODT) 8 MG disintegrating tablet, TAKE 1 TABLET EVERY 6 HOURS AS NEEDED FOR NAUSEA AND VOMITING, Disp: 30 tablet, Rfl: 2   PHENObarbital (LUMINAL) 60 MG tablet, Take 1 tablet (60 mg total) by mouth daily., Disp: 90 tablet, Rfl: 3   Probiotic Product (SOLUBLE FIBER/PROBIOTICS PO),  Take 1 capsule by mouth daily., Disp: , Rfl:    rizatriptan (MAXALT) 10 MG tablet, TAKE 1 TABLET BY MOUTH AS NEEDED FOR MIGRAINE. MAY REPEAT IN 2 HOURS IF NEEDED, Disp: 9 tablet, Rfl: 4   umeclidinium-vilanterol (ANORO ELLIPTA) 62.5-25 MCG/INH AEPB, Inhale 1 puff into the lungs daily., Disp: 60 each, Rfl: 6   vitamin B-12 (CYANOCOBALAMIN) 1000 MCG tablet, Take 1,000 mcg by mouth daily., Disp: , Rfl:    zolpidem (AMBIEN) 10 MG tablet, TAKE 1 TABLET BY MOUTH AT BEDTIME AS NEEDED FOR SLEEP, Disp: 30 tablet, Rfl: 5   cetirizine (ZYRTEC) 10 MG tablet, Take 10 mg by mouth daily., Disp: , Rfl:    predniSONE (DELTASONE) 10 MG tablet, 6 tablets on Day 1 , then reduce by 1 tablet daily until gone, Disp: 21 tablet, Rfl: 0   raloxifene (EVISTA) 60 MG tablet, Take 1 tablet (60 mg total) by mouth daily., Disp: 30 tablet, Rfl: 5  EXAM:  VITALS per patient if applicable:  GENERAL: alert, oriented, appears well and in no acute distress  HEENT: atraumatic, conjunttiva clear, no obvious abnormalities on inspection of external nose and ears  NECK: normal movements of the head and neck  LUNGS: on inspection no signs of respiratory distress, breathing rate appears normal, no obvious gross SOB, gasping or wheezing  CV: no obvious cyanosis  Barbara: moves all visible extremities without noticeable abnormality  PSYCH/NEURO: pleasant and cooperative, no obvious depression or anxiety, speech and thought processing grossly intact  ASSESSMENT AND PLAN:  Discussed the following assessment and plan:  Pain of left calf - Plan: D-Dimer, Quantitative, D-Dimer, Quantitative  Osteopenia of necks of both femurs  Pain of left calf Pre test probability is moderate only because of use of Evista in the setting of recent travel. .  D Dimer ordered; If negative ,  DVT is unlikely.   Osteopenia Advised to continue suspension of Evista until  DVT has been ruled out with D Dimer plus/minus  Ultrasound of left calf.      I  discussed the assessment and treatment plan with the patient. The patient was provided an opportunity to ask questions and all were answered. The patient agreed with the plan and demonstrated an understanding of the instructions.   The patient was advised to call back or seek an in-person evaluation if the symptoms worsen or if the condition fails to improve as anticipated.   I spent 20 minutes dedicated to the care of this patient on the date of this encounter to include pre-visit review of his medical history,  Face-to-face time with the patient , and post visit ordering of testing and therapeutics.  Crecencio Mc, MD

## 2021-11-16 NOTE — Assessment & Plan Note (Signed)
Advised to continue suspension of Evista until  DVT has been ruled out with D Dimer plus/minus  Ultrasound of left calf.

## 2021-11-20 LAB — D-DIMER, QUANTITATIVE: D-DIMER: 0.34 mg/L FEU (ref 0.00–0.49)

## 2021-11-21 ENCOUNTER — Encounter: Payer: Self-pay | Admitting: Internal Medicine

## 2021-12-15 ENCOUNTER — Encounter: Payer: Self-pay | Admitting: Internal Medicine

## 2021-12-16 MED ORDER — RSVPREF3 VAC RECOMB ADJUVANTED 120 MCG/0.5ML IM SUSR: 0.5000 mL | Freq: Once | INTRAMUSCULAR | 0 refills | Status: AC

## 2021-12-27 LAB — HM COLONOSCOPY

## 2021-12-30 ENCOUNTER — Other Ambulatory Visit: Payer: Self-pay | Admitting: Internal Medicine

## 2022-01-07 ENCOUNTER — Telehealth: Payer: Self-pay | Admitting: Internal Medicine

## 2022-01-07 NOTE — Telephone Encounter (Signed)
Spoke with patient she was in [facility] will call back 01/17/22

## 2022-01-24 ENCOUNTER — Ambulatory Visit (INDEPENDENT_AMBULATORY_CARE_PROVIDER_SITE_OTHER): Payer: Medicare Other

## 2022-01-24 VITALS — Ht 66.5 in | Wt 134.0 lb

## 2022-01-24 DIAGNOSIS — Z0000 Encounter for general adult medical examination without abnormal findings: Secondary | ICD-10-CM

## 2022-01-24 NOTE — Progress Notes (Addendum)
Subjective:   Barbara Odom is a 71 y.o. female who presents for Medicare Annual (Subsequent) preventive examination.  Review of Systems    No ROS.  Medicare Wellness Virtual Visit.  Visual/audio telehealth visit, UTA vital signs.   See social history for additional risk factors.   Cardiac Risk Factors include: advanced age (>72mn, >>80women)     Objective:    Today's Vitals   01/24/22 1239  Weight: 134 lb (60.8 kg)  Height: 5' 6.5" (1.689 m)   Body mass index is 21.3 kg/m.     01/24/2022   12:37 PM 01/08/2021    1:55 PM 01/08/2020    1:17 PM 12/20/2019    3:44 PM 01/07/2019    1:14 PM 09/29/2017   12:19 PM 10/07/2016    3:18 AM  Advanced Directives  Does Patient Have a Medical Advance Directive? Yes Yes Yes Yes Yes No No  Type of AScientist, physiologicalof AFrankewingLiving will HElroyLiving will HPelicanLiving will    Does patient want to make changes to medical advance directive? No - Patient declined No - Patient declined   No - Patient declined    Copy of HStocktonin Chart?   No - copy requested  No - copy requested    Would patient like information on creating a medical advance directive?       No - Patient declined    Current Medications (verified) Outpatient Encounter Medications as of 01/24/2022  Medication Sig   aspirin EC 81 MG tablet Take 81 mg by mouth daily. Swallow whole.   Biotin 5000 MCG CAPS Take 1 capsule by mouth. Daily   cetirizine (ZYRTEC) 10 MG tablet Take 10 mg by mouth daily.   Cholecalciferol (VITAMIN D-3 PO) Take 2,000 Units by mouth.   Cranberry-Vitamin C (CRANBERRY CONCENTRATE/VITAMINC PO) Take 4,200 mg by mouth daily.   Magnesium 400 MG CAPS Take 1 capsule by mouth daily.   Multiple Vitamins-Minerals (PRESERVISION AREDS 2 PO) Take 1 capsule by mouth.   ondansetron (ZOFRAN-ODT) 8 MG disintegrating tablet TAKE 1 TABLET EVERY 6 HOURS AS NEEDED FOR NAUSEA AND  VOMITING   PHENObarbital (LUMINAL) 60 MG tablet Take 1 tablet (60 mg total) by mouth daily.   predniSONE (DELTASONE) 10 MG tablet 6 tablets on Day 1 , then reduce by 1 tablet daily until gone   Probiotic Product (SOLUBLE FIBER/PROBIOTICS PO) Take 1 capsule by mouth daily.   raloxifene (EVISTA) 60 MG tablet TAKE 1 TABLET BY MOUTH EVERY DAY   rizatriptan (MAXALT) 10 MG tablet TAKE 1 TABLET BY MOUTH AS NEEDED FOR MIGRAINE. MAY REPEAT IN 2 HOURS IF NEEDED   umeclidinium-vilanterol (ANORO ELLIPTA) 62.5-25 MCG/INH AEPB Inhale 1 puff into the lungs daily.   vitamin B-12 (CYANOCOBALAMIN) 1000 MCG tablet Take 1,000 mcg by mouth daily.   zolpidem (AMBIEN) 10 MG tablet TAKE 1 TABLET BY MOUTH AT BEDTIME AS NEEDED FOR SLEEP   No facility-administered encounter medications on file as of 01/24/2022.    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   BREAST SURGERY     lumpectomy, benign    CARPAL TUNNEL RELEASE  1983   bilateral    CERVICAL DISCECTOMY  August 2012   [place]   Family History  Problem Relation Age of Onset   Heart disease Mother        Atrial fibrillation   Coronary artery disease Mother    Cancer Maternal Grandmother        colon Ca   Cancer Maternal Grandfather        Colon CA   Macular degeneration Father    Congestive Heart Failure Father    Breast cancer Neg Hx    Social History   Socioeconomic History    Marital status: Married    Spouse name: Not on file   Number of children: Not on file   Years of education: Not on file   Highest education level: Not on file  Occupational History   Occupation: BEHAVIOR MED    Employer: [employer]  Tobacco Use   Smoking status: Never   Smokeless tobacco: Never  Vaping Use   Vaping Use: Never used  Substance and Sexual Activity   Alcohol use: Yes    Alcohol/week: 2.0 standard drinks of alcohol    Types: 1 Cans of beer, 1 Shots of liquor per week   Drug use: No   Sexual activity: Yes    Partners: Male    Comment: 1st intercourse- 19, partners- 58, married- 51 yrs   Other Topics Concern   Not on file  Social History Narrative   Not on file   Social Determinants of Health   Financial Resource Strain: Low Risk  (01/24/2022)   Overall Financial Resource Strain (CARDIA)    Difficulty of Paying Living Expenses: Not hard at all  Food Insecurity: No Food Insecurity (01/24/2022)   Hunger Vital Sign    Worried About Running Out of Food in the Last Year: Never true    [place] in the Last Year: Never true  Transportation Needs: No Transportation Needs (01/24/2022)   PRAPARE - Hydrologist (Medical): No    Lack of Transportation (Non-Medical): No  Physical Activity: Sufficiently Active (01/24/2022)   Exercise Vital Sign    Days of Exercise per Week: 5 days    Minutes of Exercise per Session: 60 min  Stress: No Stress Concern Present (01/24/2022)   [place]    Feeling of Stress : Not at all  Social Connections: Unknown (01/24/2022)   Social Connection and Isolation Panel [NHANES]    Frequency of Communication with Friends and Family: Not on file    Frequency of Social Gatherings with Friends and Family: Not on file    Attends Religious Services: Not on file    Active Member of Clubs or Organizations: Not on file    Attends Theatre manager Meetings: Not on file    Marital Status: Married    Tobacco Counseling Counseling given: Not Answered   Clinical Intake:  Pre-visit preparation completed: Yes        Diabetes: No  How often do you need to have someone help you when you read instructions, pamphlets, or other  written materials from your doctor or pharmacy?: 1 - Never   Interpreter Needed?: No    Activities of Daily Living    01/24/2022   12:34 PM  In your present state of health, do you have any difficulty performing the following activities:  Hearing? 0  Vision? 0  Difficulty concentrating or making decisions? 0  Walking or climbing stairs? 0  Dressing or bathing? 0  Doing errands, shopping? 0  Preparing Food and eating ? N  Using the Toilet? N  In the past six months, have you accidently leaked urine? N  Do you have problems with loss of bowel control? N  Managing your Medications? N  Managing your Finances? N  Housekeeping or managing your Housekeeping? N    Patient Care Team: Crecencio Mc, MD as PCP - General (Internal Medicine)  Indicate any recent Medical Services you may have received from other than [facility] providers in the past year (date may be approximate).     Assessment:   This is a routine wellness examination for [place].  I connected with  Danie Binder on 01/24/22 by a audio enabled telemedicine application and verified that I am speaking with the correct person using two identifiers.  Patient Location: Home  Provider Location: Office/Clinic  I discussed the limitations of evaluation and management by telemedicine. The patient expressed understanding and agreed to proceed.   Hearing/Vision screen Hearing Screening - Comments:: Patient is able to hear conversational tones without difficulty. No issues reported. Vision Screening - Comments:: Wears readers   Dietary issues and exercise activities discussed: Current Exercise Habits: Home exercise  routine, Type of exercise: stretching;yoga, Time (Minutes): 60, Frequency (Times/Week): 5, Weekly Exercise (Minutes/Week): 300, Intensity: Moderate Healthy diet Good water intake    Goals Addressed             This Visit's Progress    Maintain Healthy Lifestyle   On track    Stay active Healthy diet Stay hydrated       Depression Screen    01/24/2022   12:39 PM 11/15/2021    4:21 PM 02/23/2021    1:31 PM 01/08/2021    1:35 PM 01/08/2020    1:18 PM 07/09/2019    1:32 PM 06/13/2019    3:57 PM  PHQ 2/9 Scores  PHQ - 2 Score 0 0 0 0 0 0 0    Fall Risk    01/24/2022   12:39 PM 11/15/2021    4:21 PM 07/06/2021   10:57 AM 02/23/2021    1:31 PM 01/08/2021    1:37 PM  Fall Risk   Falls in the past year? 0 0 0 0 0  Number falls in past yr: 0 0     Injury with Fall? 0 0     Risk for fall due to :  No Fall Risks No Fall Risks No Fall Risks   Follow up Falls evaluation completed;Falls prevention discussed Falls evaluation completed Falls evaluation completed Falls evaluation completed Falls evaluation completed    FALL RISK PREVENTION PERTAINING TO THE HOME: Home free of loose throw rugs in walkways, pet beds, electrical cords, etc? Yes  Adequate lighting in your home to reduce risk of falls? Yes   ASSISTIVE DEVICES UTILIZED TO PREVENT FALLS: Life alert? No  Use of a cane, [place] or w/c? No  Grab bars in the bathroom? No  Shower chair or bench in shower? No  Elevated toilet seat or a handicapped toilet? No   TIMED UP AND  GO: Was the test performed? No .   Cognitive Function:  Patient is alert and oriented x3.  Denies difficulty with memory loss, making decisions.  Manages her own finances and medications. ] 100% independent.       01/24/2022   12:44 PM 01/07/2019    1:39 PM  6CIT Screen  What Year?  0 points  What month?  0 points  What time?  0 points  Count back from 20  0 points  Months in reverse 0 points 0 points  Repeat phrase  0 points  Total Score  0  points    Immunizations Immunization History  Administered Date(s) Administered   Fluad Quad(high Dose 65+) 01/02/2019, 01/10/2020, 12/24/2020   Influenza Split 02/05/2012, 12/19/2012   Influenza, High Dose Seasonal PF 12/20/2021   Moderna Covid-19 Vaccine Bivalent Booster 26yr & up 01/28/2021   PFIZER(Purple Top)SARS-COV-2 Vaccination 05/03/2019, 05/28/2019, 01/13/2020, 08/06/2020   Pneumococcal Conjugate-13 02/26/2019   Pneumococcal Polysaccharide-23 03/26/2020   Tdap 01/23/2013   Zoster Recombinat (Shingrix) 01/29/2019, 04/24/2020   Screening Tests Health Maintenance  Topic Date Due   Diabetic kidney evaluation - Urine ACR  Never done   Diabetic kidney evaluation - GFR measurement  03/26/2021   COVID-19 Vaccine (6 - Pfizer risk series) 02/09/2022 (Originally 03/25/2021)   MAMMOGRAM  06/04/2022   TETANUS/TDAP  01/24/2023   Medicare Annual Wellness (AWV)  01/25/2023   COLONOSCOPY (Pts 45-484yrInsurance coverage will need to be confirmed)  12/16/2031   Pneumonia Vaccine 6574Years old  Completed   INFLUENZA VACCINE  Completed   DEXA SCAN  Completed   Hepatitis C Screening  Completed   Zoster Vaccines- Shingrix  Completed   HPV VACCINES  Aged Out    Health Maintenance Health Maintenance Due  Topic Date Due   Diabetic kidney evaluation - Urine ACR  Never done   Diabetic kidney evaluation - GFR measurement  03/26/2021   Colonoscopy- completed with [facility].   Lung Cancer Screening: (Low Dose CT Chest recommended if Age 69-80ears, 30 pack-year currently smoking OR have quit w/in 15years.) does not qualify.   Hepatitis C Screening: Completed 2016.  Vision Screening: Recommended annual ophthalmology exams for early detection of glaucoma and other disorders of the eye.  Dental Screening: Recommended annual dental exams for proper oral hygiene  Community Resource Referral / Chronic Care Management: CRR required this visit?  No   CCM required this visit?  No      Plan:      I have personally reviewed and noted the following in the patient's chart:   Medical and social history Use of alcohol, tobacco or illicit drugs  Current medications and supplements including opioid prescriptions. Patient is not currently taking opioid prescriptions. Functional ability and status Nutritional status Physical activity Advanced directives List of other physicians Hospitalizations, surgeries, and ER visits in previous 12 months Vitals Screenings to include cognitive, depression, and falls Referrals and appointments  In addition, I have reviewed and discussed with patient certain preventive protocols, quality metrics, and best practice recommendations. A written personalized care plan for preventive services as well as general preventive health recommendations were provided to patient.     DeCampo BonitoLPN   [id number]    I have reviewed the above information and agree with above.   TeDeborra MedinaMD

## 2022-01-24 NOTE — Patient Instructions (Signed)
Barbara Odom , Thank you for taking time to come for your Medicare Wellness Visit. I appreciate your ongoing commitment to your health goals. Please review the following plan we discussed and let me know if I can assist you in the future.   These are the goals we discussed:  Goals      Maintain Healthy Lifestyle     Stay active Healthy diet Stay hydrated        This is a list of the screening recommended for you and due dates:  Health Maintenance  Topic Date Due   Yearly kidney health urinalysis for diabetes  Never done   Yearly kidney function blood test for diabetes  03/26/2021   COVID-19 Vaccine (6 - Pfizer risk series) 02/09/2022*   Mammogram  06/04/2022   Tetanus Vaccine  01/24/2023   Medicare Annual Wellness Visit  01/25/2023   Colon Cancer Screening  12/16/2031   Pneumonia Vaccine  Completed   Flu Shot  Completed   DEXA scan (bone density measurement)  Completed   Hepatitis C Screening: USPSTF Recommendation to screen - Ages 88-79 yo.  Completed   Zoster (Shingles) Vaccine  Completed   HPV Vaccine  Aged Out  *Topic was postponed. The date shown is not the original due date.   Preventive Care 50 Years and Older, Female Preventive care refers to lifestyle choices and visits with your health care provider that can promote health and wellness. What does preventive care include? A yearly physical exam. This is also called an annual well check. Dental exams once or twice a year. Routine eye exams. Ask your health care provider how often you should have your eyes checked. Personal lifestyle choices, including: Daily care of your teeth and gums. Regular physical activity. Eating a healthy diet. Avoiding tobacco and drug use. Limiting alcohol use. Practicing safe sex. Taking low-dose aspirin every day. Taking vitamin and mineral supplements as recommended by your health care provider. What happens during an annual well check? The services and screenings done by your health  care provider during your annual well check will depend on your age, overall health, lifestyle risk factors, and family history of disease. Counseling  Your health care provider may ask you questions about your: Alcohol use. Tobacco use. Drug use. Emotional well-being. Home and relationship well-being. Sexual activity. Eating habits. History of falls. Memory and ability to understand (cognition). Work and work Statistician. Reproductive health. Screening  You may have the following tests or measurements: Height, weight, and BMI. Blood pressure. Lipid and cholesterol levels. These may be checked every 5 years, or more frequently if you are over 36 years old. Skin check. Lung cancer screening. You may have this screening every year starting at age 48 if you have a 30-pack-year history of smoking and currently smoke or have quit within the past 15 years. Fecal occult blood test (FOBT) of the stool. You may have this test every year starting at age 66. Flexible sigmoidoscopy or colonoscopy. You may have a sigmoidoscopy every 5 years or a colonoscopy every 10 years starting at age 28. Hepatitis C blood test. Hepatitis B blood test. Sexually transmitted disease (STD) testing. Diabetes screening. This is done by checking your blood sugar (glucose) after you have not eaten for a while (fasting). You may have this done every 1-3 years. Bone density scan. This is done to screen for osteoporosis. You may have this done starting at age 31. Mammogram. This may be done every 1-2 years. Talk to your health care  provider about how often you should have regular mammograms. Talk with your health care provider about your test results, treatment options, and if necessary, the need for more tests. Vaccines  Your health care provider may recommend certain vaccines, such as: Influenza vaccine. This is recommended every year. Tetanus, diphtheria, and acellular pertussis (Tdap, Td) vaccine. You may need a Td  booster every 10 years. Zoster vaccine. You may need this after age 54. Pneumococcal 13-valent conjugate (PCV13) vaccine. One dose is recommended after age 9. Pneumococcal polysaccharide (PPSV23) vaccine. One dose is recommended after age 10. Talk to your health care provider about which screenings and vaccines you need and how often you need them. This information is not intended to replace advice given to you by your health care provider. Make sure you discuss any questions you have with your health care provider. Document Released: 04/03/2015 Document Revised: 11/25/2015 Document Reviewed: 01/06/2015 Elsevier Interactive Patient Education  2017 [place] Prevention in the Home Falls can cause injuries. They can happen to people of all ages. There are many things you can do to make your home safe and to help prevent falls. What can I do on the outside of my home? Regularly fix the edges of walkways and driveways and fix any cracks. Remove anything that might make you trip as you walk through a door, such as a raised step or threshold. Trim any bushes or trees on the path to your home. Use bright outdoor lighting. Clear any walking paths of anything that might make someone trip, such as rocks or tools. Regularly check to see if handrails are loose or broken. Make sure that both sides of any steps have handrails. Any raised decks and porches should have guardrails on the edges. Have any leaves, snow, or ice cleared regularly. Use sand or salt on walking paths during winter. Clean up any spills in your garage right away. This includes oil or grease spills. What can I do in the bathroom? Use night lights. Install grab bars by the toilet and in the tub and shower. Do not use towel bars as grab bars. Use non-skid mats or decals in the tub or shower. If you need to sit down in the shower, use a plastic, non-slip stool. Keep the floor dry. Clean up any water that spills on the floor  as soon as it happens. Remove soap buildup in the tub or shower regularly. Attach bath mats securely with double-sided non-slip rug tape. Do not have throw rugs and other things on the floor that can make you trip. What can I do in the bedroom? Use night lights. Make sure that you have a light by your bed that is easy to reach. Do not use any sheets or blankets that are too big for your bed. They should not hang down onto the floor. Have a firm chair that has side arms. You can use this for support while you get dressed. Do not have throw rugs and other things on the floor that can make you trip. What can I do in the kitchen? Clean up any spills right away. Avoid walking on wet floors. Keep items that you use a lot in easy-to-reach places. If you need to reach something above you, use a strong step stool that has a grab bar. Keep electrical cords out of the way. Do not use floor polish or wax that makes floors slippery. If you must use wax, use non-skid floor wax. Do not have throw rugs  and other things on the floor that can make you trip. What can I do with my stairs? Do not leave any items on the stairs. Make sure that there are handrails on both sides of the stairs and use them. Fix handrails that are broken or loose. Make sure that handrails are as long as the stairways. Check any carpeting to make sure that it is firmly attached to the stairs. Fix any carpet that is loose or worn. Avoid having throw rugs at the top or bottom of the stairs. If you do have throw rugs, attach them to the floor with carpet tape. Make sure that you have a light switch at the top of the stairs and the bottom of the stairs. If you do not have them, ask someone to add them for you. What else can I do to help prevent falls? Wear shoes that: Do not have high heels. Have rubber bottoms. Are comfortable and fit you well. Are closed at the toe. Do not wear sandals. If you use a stepladder: Make sure that it is  fully opened. Do not climb a closed stepladder. Make sure that both sides of the stepladder are locked into place. Ask someone to hold it for you, if possible. Clearly mark and make sure that you can see: Any grab bars or handrails. First and last steps. Where the edge of each step is. Use tools that help you move around (mobility aids) if they are needed. These include: Canes. Walkers. Scooters. Crutches. Turn on the lights when you go into a dark area. Replace any light bulbs as soon as they burn out. Set up your furniture so you have a clear path. Avoid moving your furniture around. If any of your floors are uneven, fix them. If there are any pets around you, be aware of where they are. Review your medicines with your doctor. Some medicines can make you feel dizzy. This can increase your chance of falling. Ask your doctor what other things that you can do to help prevent falls. This information is not intended to replace advice given to you by your health care provider. Make sure you discuss any questions you have with your health care provider. Document Released: 01/01/2009 Document Revised: 08/13/2015 Document Reviewed: 04/11/2014 Elsevier Interactive Patient Education  2017 [employer].

## 2022-01-26 ENCOUNTER — Encounter: Payer: Self-pay | Admitting: Internal Medicine

## 2022-02-25 ENCOUNTER — Encounter: Payer: Medicare Other | Admitting: Internal Medicine

## 2022-03-02 NOTE — Telephone Encounter (Signed)
MyChart messgae sent to patient. 

## 2022-03-02 NOTE — Telephone Encounter (Signed)
Error

## 2022-03-07 ENCOUNTER — Encounter: Payer: Self-pay | Admitting: Internal Medicine

## 2022-03-07 ENCOUNTER — Ambulatory Visit (INDEPENDENT_AMBULATORY_CARE_PROVIDER_SITE_OTHER): Payer: Medicare Other | Admitting: Internal Medicine

## 2022-03-07 VITALS — BP 130/76 | HR 77 | Temp 98.3°F | Ht 66.5 in | Wt 135.2 lb

## 2022-03-07 DIAGNOSIS — G40909 Epilepsy, unspecified, not intractable, without status epilepticus: Secondary | ICD-10-CM

## 2022-03-07 DIAGNOSIS — E785 Hyperlipidemia, unspecified: Secondary | ICD-10-CM

## 2022-03-07 DIAGNOSIS — Z1231 Encounter for screening mammogram for malignant neoplasm of breast: Secondary | ICD-10-CM

## 2022-03-07 DIAGNOSIS — R5383 Other fatigue: Secondary | ICD-10-CM

## 2022-03-07 DIAGNOSIS — Z87448 Personal history of other diseases of urinary system: Secondary | ICD-10-CM

## 2022-03-07 DIAGNOSIS — Z79899 Other long term (current) drug therapy: Secondary | ICD-10-CM | POA: Diagnosis not present

## 2022-03-07 DIAGNOSIS — Z0000 Encounter for general adult medical examination without abnormal findings: Secondary | ICD-10-CM

## 2022-03-07 DIAGNOSIS — R030 Elevated blood-pressure reading, without diagnosis of hypertension: Secondary | ICD-10-CM

## 2022-03-07 DIAGNOSIS — I700 Atherosclerosis of aorta: Secondary | ICD-10-CM

## 2022-03-07 DIAGNOSIS — M25511 Pain in right shoulder: Secondary | ICD-10-CM

## 2022-03-07 MED ORDER — MOLNUPIRAVIR EUA 200MG CAPSULE: 4.0000 | ORAL_CAPSULE | Freq: Two times a day (BID) | ORAL | 0 refills | Status: AC

## 2022-03-07 MED ORDER — PREDNISONE 10 MG PO TABS: ORAL_TABLET | ORAL | 0 refills | Status: DC

## 2022-03-07 NOTE — Assessment & Plan Note (Signed)
Benign , by previous urology workup

## 2022-03-07 NOTE — Assessment & Plan Note (Signed)
Managed since adolescence with phenobarbital.  She has been asymptomatic

## 2022-03-07 NOTE — Assessment & Plan Note (Signed)
Untreated due to history of statin intolerance with lipitor .  She was also intolerant of every other day rosuvastatin

## 2022-03-07 NOTE — Progress Notes (Signed)
Patient ID: Barbara Odom, female    DOB: [birth year]  Age: 71 y.o. MRN: [id number]  The patient is here for annual preventive examination and management of other chronic and acute problems.   The risk factors are reflected in the social history.  The roster of all physicians providing medical care to patient - is listed in the Snapshot section of the chart.  Activities of daily living:  The patient is 100% independent in all ADLs: dressing, toileting, feeding as well as independent mobility  Home safety : The patient has smoke detectors in the home. They wear seatbelts.  There are no firearms at home. There is no violence in the home.   There is no risks for hepatitis, STDs or HIV. There is no   history of blood transfusion. They have no travel history to infectious disease endemic areas of the world.  The patient has seen their dentist in the last six month. They have seen their eye doctor in the last year. They admit to slight hearing difficulty with regard to whispered voices and some television programs.  They have deferred audiologic testing in the last year.  They do not  have excessive sun exposure. Discussed the need for sun protection: hats, long sleeves and use of sunscreen if there is significant sun exposure.   Diet: the importance of a healthy diet is discussed. They do have a healthy diet.  The benefits of regular aerobic exercise were discussed. She walks 4 times per week ,  60 minutes.   Depression screen: there are no signs or vegative symptoms of depression- irritability, change in appetite, anhedonia, sadness/tearfullness.  Cognitive assessment: the patient manages all their financial and personal affairs and is actively engaged. They could relate day,date,year and events; recalled 2/3 objects at 3 minutes; performed clock-face test normally.  The following portions of the patient's history were reviewed and updated as appropriate: allergies, current medications, past  family history, past medical history,  past surgical history, past social history  and problem list.  Visual acuity was not assessed per patient preference since she has regular follow up with her ophthalmologist. Hearing and body mass index were assessed and reviewed.   During the course of the visit the patient was educated and counseled about appropriate screening and preventive services including : fall prevention , diabetes screening, nutrition counseling, colorectal cancer screening, and recommended immunizations.    CC: The primary encounter diagnosis was Fatigue, unspecified type. Diagnoses of Hyperlipidemia, unspecified hyperlipidemia type, Long-term use of high-risk medication, Encounter for screening mammogram for malignant neoplasm of breast, Elevated blood pressure reading in office without diagnosis of hypertension, Encounter for preventive health examination, Seizure disorder [facility]), Aortic arch atherosclerosis ([place]), Right shoulder pain, unspecified chronicity, and History of hematuria were also pertinent to this visit.  Colonoscopy Dec 27 2021 [place] by Dr Rosana Berger . No polyps  follow up 10 yrs ( grandparents ).  Left shoulder pain  resolved. Right shoulder pain , new onset.  S/a injection did not help 10 days ago.  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   She has a past surgical history that includes Carpal tunnel release (1983); Breast surgery; Cervical discectomy (  August 2012); Breast excisional biopsy (Left, 1997); and Breast biopsy (Right, 2005).   Her family history includes Cancer in her maternal  grandfather and maternal grandmother; Congestive Heart Failure in her father; Coronary artery disease in her mother; Heart disease in her mother; Macular degeneration in her father.She reports that she has never smoked. She has never used smokeless tobacco. She reports current alcohol use of about 2.0 standard drinks of alcohol per week. She reports that she does not use drugs.  Outpatient Medications Prior to Visit  Medication Sig Dispense Refill   aspirin EC 81 MG tablet Take 81 mg by mouth daily. Swallow whole.     Biotin 5000 MCG CAPS Take 1 capsule by mouth. Daily     Cholecalciferol (VITAMIN D-3 PO) Take 2,000 Units by mouth.     CITRUS BERGAMOT PO      Cranberry-Vitamin C (CRANBERRY CONCENTRATE/VITAMINC PO) Take 4,200 mg by mouth daily.     Magnesium 400 MG CAPS Take 1 capsule by mouth daily.     Multiple Vitamins-Minerals (PRESERVISION AREDS 2 PO) Take 1 capsule by mouth.     naproxen (NAPROSYN) 250 MG tablet Take 250 mg by mouth 2 (two) times daily with a meal.     ondansetron (ZOFRAN-ODT) 8 MG disintegrating tablet TAKE 1 TABLET EVERY 6 HOURS AS NEEDED FOR NAUSEA AND VOMITING 30 tablet 2   PHENObarbital (LUMINAL) 60 MG tablet Take 1 tablet (60 mg total) by mouth daily. 90 tablet 3   Probiotic Product (SOLUBLE FIBER/PROBIOTICS PO) Take 1 capsule by mouth daily.     raloxifene (EVISTA) 60 MG tablet TAKE 1 TABLET BY MOUTH EVERY DAY 90 tablet 1   rizatriptan (MAXALT) 10 MG tablet TAKE 1 TABLET BY MOUTH AS NEEDED FOR MIGRAINE. MAY REPEAT IN 2 HOURS IF NEEDED 9 tablet 4   umeclidinium-vilanterol (ANORO ELLIPTA) 62.5-25 MCG/INH AEPB Inhale 1 puff into the lungs daily. 60 each 6   vitamin B-12 (CYANOCOBALAMIN) 1000 MCG tablet Take 1,000 mcg by mouth daily.     zolpidem (AMBIEN) 10 MG tablet TAKE 1 TABLET BY MOUTH AT BEDTIME AS NEEDED FOR SLEEP 30 tablet 5   cetirizine (ZYRTEC) 10 MG tablet Take 10 mg by mouth daily.     predniSONE (DELTASONE) 10 MG tablet 6 tablets on Day 1 , then reduce by 1  tablet daily until gone 21 tablet 0   No facility-administered medications prior to visit.    Review of Systems  Patient denies headache, fevers, malaise, unintentional weight loss, skin rash, eye pain, sinus congestion and sinus pain, sore throat, dysphagia,  hemoptysis , cough, dyspnea, wheezing, chest pain, palpitations, orthopnea, edema, abdominal pain, nausea, melena, diarrhea, constipation, flank pain, dysuria, hematuria, urinary  Frequency, nocturia, numbness, tingling, seizures,  Focal weakness, Loss of consciousness,  Tremor, insomnia, depression, anxiety, and suicidal ideation.     Objective:  BP 130/76   Pulse 77   Temp 98.3 F (36.8 C) (Oral)   Ht 5' 6.5" (1.689 m)   Wt 135 lb 3.2 oz (61.3 kg)   SpO2 97%   BMI 21.49 kg/m   Physical Exam Vitals reviewed.  Constitutional:      General: She is not in acute distress.    Appearance: Normal appearance. She is well-developed and normal weight. She is not ill-appearing, toxic-appearing or diaphoretic.  HENT:     Head: Normocephalic.     Right Ear: Tympanic membrane, ear canal and external ear normal. There is no impacted cerumen.     Left Ear:  Tympanic membrane, ear canal and external ear normal. There is no impacted cerumen.     Nose: Nose normal.     Mouth/Throat:     Mouth: Mucous membranes are moist.     Pharynx: Oropharynx is clear.  Eyes:     General: No scleral icterus.       Right eye: No discharge.        Left eye: No discharge.     Conjunctiva/sclera: Conjunctivae normal.     Pupils: Pupils are equal, round, and reactive to light.  Neck:     Thyroid: No thyromegaly.     Vascular: No carotid bruit or JVD.  Cardiovascular:     Rate and Rhythm: Normal rate and regular rhythm.     Heart sounds: Normal heart sounds.  Pulmonary:     Effort: Pulmonary effort is normal. No respiratory distress.     Breath sounds: Normal breath sounds.  Chest:  Breasts:    Breasts are symmetrical.     Right: Normal. No  swelling, inverted nipple, mass, nipple discharge, skin change or tenderness.     Left: Normal. No swelling, inverted nipple, mass, nipple discharge, skin change or tenderness.  Abdominal:     General: Bowel sounds are normal.     Palpations: Abdomen is soft. There is no mass.     Tenderness: There is no abdominal tenderness. There is no guarding or rebound.  Musculoskeletal:        General: Normal range of motion.     Cervical back: Normal range of motion and neck supple.  Lymphadenopathy:     Cervical: No cervical adenopathy.     Upper Body:     Right upper body: No supraclavicular, axillary or pectoral adenopathy.     Left upper body: No supraclavicular, axillary or pectoral adenopathy.  Skin:    General: Skin is warm and dry.  Neurological:     General: No focal deficit present.     Mental Status: She is alert and oriented to person, place, and time. Mental status is at baseline.  Psychiatric:        Mood and Affect: Mood normal.        Behavior: Behavior normal.        Thought Content: Thought content normal.        Judgment: Judgment normal.     Assessment & Plan:  Fatigue, unspecified type -     TSH -     CBC with Differential/Platelet  Hyperlipidemia, unspecified hyperlipidemia type -     Lipid panel -     LDL cholesterol, direct  Long-term use of high-risk medication -     TSH -     CBC with Differential/Platelet -     Comprehensive metabolic panel  Encounter for screening mammogram for malignant neoplasm of breast -     3D Screening Mammogram, Left and Right; Future  Elevated blood pressure reading in office without diagnosis of hypertension -     Microalbumin / creatinine urine ratio  Encounter for preventive health examination Assessment & Plan: age appropriate education and counseling updated, referrals for preventative services and immunizations addressed, dietary and smoking counseling addressed, most recent labs reviewed.  I have personally reviewed  and have noted:   1) the patient's medical and social history 2) The pt's use of alcohol, tobacco, and illicit drugs 3) The patient's current medications and supplements 4) Functional ability including ADL's, fall risk, home safety risk, hearing and visual impairment 5) Diet and physical  activities 6) Evidence for depression or mood disorder 7) The patient's height, weight, and BMI have been recorded in the chart  I have made referrals, and provided counseling and education based on review of the above    Seizure disorder [facility]) Assessment & Plan: Managed since adolescence with phenobarbital.  She has been asymptomatic    Aortic arch atherosclerosis ([place]) Assessment & Plan: Untreated due to history of statin intolerance with lipitor .  She was also intolerant of every other day rosuvastatin    Right shoulder pain, unspecified chronicity  History of hematuria Assessment & Plan: Benign , by previous urology workup   Other orders -     predniSONE; 6 tablets daily for 3 days, then reduce by 1 tablet daily until gone  Dispense: 33 tablet; Refill: 0 -     molnupiravir EUA; Take 4 capsules (800 mg total) by mouth 2 (two) times daily for 5 days.  Dispense: 40 capsule; Refill: 0      I provided 40 minutes of  face-to-face time during this encounter reviewing patient's current problems and past surgeries,  recent labs and imaging studies, providing counseling on the above mentioned problems , and coordination  of care .   Follow-up: No follow-ups on file.   Crecencio Mc, MD

## 2022-03-07 NOTE — Patient Instructions (Signed)
I HAVE SENT AN RX FOR MOLNUPIRAVIR (FOR COVID) AND A [facility] [address] TO TAKE WITH YOU TO [place]  YOUR MAMMOGRAM HAS BEEN ORDERED FOR [place] [facility]

## 2022-03-07 NOTE — Assessment & Plan Note (Signed)

## 2022-03-08 LAB — COMPREHENSIVE METABOLIC PANEL
ALT: 13 U/L (ref 0–35)
AST: 20 U/L (ref 0–37)
Albumin: 4.4 g/dL (ref 3.5–5.2)
Alkaline Phosphatase: 47 U/L (ref 39–117)
BUN: 12 mg/dL (ref 6–23)
CO2: 32 mEq/L (ref 19–32)
Calcium: 9.9 mg/dL (ref 8.4–10.5)
Chloride: 99 mEq/L (ref 96–112)
Creatinine, Ser: 0.76 mg/dL (ref 0.40–1.20)
GFR: 79.93 mL/min (ref >60.00)
Glucose, Bld: 92 mg/dL (ref 70–99)
Potassium: 3.9 mEq/L (ref 3.5–5.1)
Sodium: 139 mEq/L (ref 135–145)
Total Bilirubin: 0.5 mg/dL (ref 0.2–1.2)
Total Protein: 7.4 g/dL (ref 6.0–8.3)

## 2022-03-08 LAB — LIPID PANEL
Cholesterol: 228 mg/dL — ABNORMAL HIGH (ref 0–200)
HDL: 83.7 mg/dL (ref >39.00)
LDL Cholesterol: 129 mg/dL — ABNORMAL HIGH (ref 0–99)
NonHDL: 144.25
Total CHOL/HDL Ratio: 3
Triglycerides: 76 mg/dL (ref 0.0–149.0)
VLDL: 15.2 mg/dL (ref 0.0–40.0)

## 2022-03-08 LAB — CBC WITH DIFFERENTIAL/PLATELET
Basophils Absolute: 0 10*3/uL (ref 0.0–0.1)
Basophils Relative: 0.6 % (ref 0.0–3.0)
Eosinophils Absolute: 0.2 10*3/uL (ref 0.0–0.7)
Eosinophils Relative: 2.8 % (ref 0.0–5.0)
HCT: 39.2 % (ref 36.0–46.0)
Hemoglobin: 13.4 g/dL (ref 12.0–15.0)
Lymphocytes Relative: 35.7 % (ref 12.0–46.0)
Lymphs Abs: 2 10*3/uL (ref 0.7–4.0)
MCHC: 34.2 g/dL (ref 30.0–36.0)
MCV: 92.5 fl (ref 78.0–100.0)
Monocytes Absolute: 0.4 10*3/uL (ref 0.1–1.0)
Monocytes Relative: 6.8 % (ref 3.0–12.0)
Neutro Abs: 3 10*3/uL (ref 1.4–7.7)
Neutrophils Relative %: 54.1 % (ref 43.0–77.0)
Platelets: 297 10*3/uL (ref 150.0–400.0)
RBC: 4.24 Mil/uL (ref 3.87–5.11)
RDW: 13.7 % (ref 11.5–15.5)
WBC: 5.6 10*3/uL (ref 4.0–10.5)

## 2022-03-08 LAB — MICROALBUMIN / CREATININE URINE RATIO
Creatinine,U: 78.1 mg/dL
Microalb Creat Ratio: 0.9 mg/g (ref 0.0–30.0)
Microalb, Ur: <0.7 mg/dL (ref 0.0–1.9)

## 2022-03-08 LAB — LDL CHOLESTEROL, DIRECT: Direct LDL: 113 mg/dL

## 2022-03-08 LAB — TSH: TSH: 1.46 u[IU]/mL (ref 0.35–5.50)

## 2022-03-30 ENCOUNTER — Other Ambulatory Visit: Payer: Self-pay | Admitting: Internal Medicine

## 2022-03-30 ENCOUNTER — Encounter: Payer: Self-pay | Admitting: Internal Medicine

## 2022-03-30 DIAGNOSIS — G40909 Epilepsy, unspecified, not intractable, without status epilepticus: Secondary | ICD-10-CM

## 2022-03-30 NOTE — Telephone Encounter (Signed)
Refilled: 09/30/2021 Last OV: 03/07/2022 Next OV: 03/08/2023

## 2022-04-06 ENCOUNTER — Encounter: Payer: Self-pay | Admitting: Internal Medicine

## 2022-04-07 MED ORDER — FLUCONAZOLE 150 MG PO TABS: 150.0000 mg | ORAL_TABLET | Freq: Every day | ORAL | 0 refills | Status: DC

## 2022-04-23 ENCOUNTER — Encounter: Payer: Self-pay | Admitting: Internal Medicine

## 2022-04-23 ENCOUNTER — Other Ambulatory Visit: Payer: Self-pay | Admitting: Internal Medicine

## 2022-04-24 ENCOUNTER — Encounter: Payer: Self-pay | Admitting: Internal Medicine

## 2022-04-25 ENCOUNTER — Encounter: Payer: Self-pay | Admitting: Internal Medicine

## 2022-04-25 ENCOUNTER — Other Ambulatory Visit: Payer: Self-pay | Admitting: Internal Medicine

## 2022-04-25 DIAGNOSIS — J984 Other disorders of lung: Secondary | ICD-10-CM | POA: Insufficient documentation

## 2022-04-25 MED ORDER — ZOLPIDEM TARTRATE 10 MG PO TABS: 10.0000 mg | ORAL_TABLET | Freq: Every evening | ORAL | 5 refills | Status: DC | PRN

## 2022-04-25 MED ORDER — BENZONATATE 200 MG PO CAPS: 200.0000 mg | ORAL_CAPSULE | Freq: Three times a day (TID) | ORAL | 1 refills | Status: DC | PRN

## 2022-04-25 MED ORDER — CHERATUSSIN AC 100-10 MG/5ML PO SOLN: 5.0000 mL | Freq: Three times a day (TID) | ORAL | 0 refills | Status: DC | PRN

## 2022-04-25 NOTE — Assessment & Plan Note (Signed)
Secondary to COVID.  Cough Suppressant sent

## 2022-04-25 NOTE — Progress Notes (Signed)
Medication sent..

## 2022-04-28 NOTE — Telephone Encounter (Signed)
Pt sched for 2/13

## 2022-05-03 ENCOUNTER — Telehealth (INDEPENDENT_AMBULATORY_CARE_PROVIDER_SITE_OTHER): Payer: Medicare Other | Admitting: Internal Medicine

## 2022-05-03 ENCOUNTER — Encounter: Payer: Self-pay | Admitting: Internal Medicine

## 2022-05-03 ENCOUNTER — Telehealth: Payer: Self-pay

## 2022-05-03 VITALS — BP 122/72 | HR 72 | Ht 66.5 in | Wt 136.0 lb

## 2022-05-03 DIAGNOSIS — J984 Other disorders of lung: Secondary | ICD-10-CM

## 2022-05-03 DIAGNOSIS — I517 Cardiomegaly: Secondary | ICD-10-CM | POA: Diagnosis not present

## 2022-05-03 DIAGNOSIS — J209 Acute bronchitis, unspecified: Secondary | ICD-10-CM

## 2022-05-03 DIAGNOSIS — J440 Chronic obstructive pulmonary disease with acute lower respiratory infection: Secondary | ICD-10-CM

## 2022-05-03 MED ORDER — PREDNISONE 10 MG PO TABS: ORAL_TABLET | ORAL | 0 refills | Status: DC

## 2022-05-03 MED ORDER — HYDROCOD POLI-CHLORPHE POLI ER 10-8 MG/5ML PO SUER: 5.0000 mL | Freq: Two times a day (BID) | ORAL | 0 refills | Status: DC | PRN

## 2022-05-03 MED ORDER — DOXYCYCLINE HYCLATE 100 MG PO TABS: 100.0000 mg | ORAL_TABLET | Freq: Two times a day (BID) | ORAL | 0 refills | Status: DC

## 2022-05-03 MED ORDER — RIZATRIPTAN BENZOATE 10 MG PO TABS: ORAL_TABLET | ORAL | 4 refills | Status: DC

## 2022-05-03 MED ORDER — CHERATUSSIN AC 100-10 MG/5ML PO SOLN: 5.0000 mL | Freq: Three times a day (TID) | ORAL | 0 refills | Status: DC | PRN

## 2022-05-03 NOTE — Progress Notes (Signed)
Virtual Visit via [place]   Note   This format is felt to be most appropriate for this patient at this time.  All issues noted in this document were discussed and addressed.  No physical exam was performed (except for noted visual exam findings with Video Visits).   I connected with Gerald Stabs  on 05/03/22 at 11:00 AM EST by a video enabled telemedicine application  and verified that I am speaking with the correct person using two identifiers. Location patient: home Location provider: work or home office Persons participating in the virtual visit: patient, provider  I discussed the limitations, risks, security and privacy concerns of performing an evaluation and management service by telephone and the availability of in person appointments. I also discussed with the patient that there may be a patient responsible charge related to this service. The patient expressed understanding and agreed to proceed.  Reason for visit: 1) Post COVID cough/malaise   2) pneumonitis and cardiomegaly reported by  Urgent Care   HPI:  72 yr old female with bronchiectasis diagnosed with  COVID respiratory infection on Feb 5 presents for follow up .Marland Kitchen  She Was treated by Urgent [facility] on Feb 3 ,  chest x ray was done which noted pneumonitis and cardiomegaly compared to Nov 2023 chest x ray .  Tested positive on feb 5 and still positive.  Initially had fevers to 102, productive cough , chst heaviness. Fevers and heaviness have resolved.  Not short of breah,  cough is productive of mucous plugs and pale yellow sputum  Nov 2023 chest x ray noted persistent RML opacity "as seen on previous exams"  and chest CT was recommended;  this has not been done .  She has a local pulmonologist [place] Darolyn Rua who has ordered chest CT which has been postponed due to her COVID infection.  She has no CHF signs or symptoms and no history of hypertension, confirmed with recent pre COVID home readings.    Currently feels fatigued and  achey but not short of breath.     ROS: See pertinent positives and negatives per HPI.  Past Medical History:  Diagnosis Date   Bronchiectasis ([place])    Calcium oxalate crystals in urine 06/13/2019   grand mal    adolescent, on low dose phenobarbital   History of diverticulitis of colon    Hyperlipidemia    MAI (mycobacterium avium-intracellulare) ([facility])    Migraine headache    Osteopenia    Pneumonia    multi-lobe    seasonal rhinitis     Past Surgical History:  Procedure Laterality Date   BREAST BIOPSY Right 2005   Negative- clip placed   BREAST EXCISIONAL BIOPSY Left 1997   Negative --- Pt states this procedure was a lumpect.   BREAST SURGERY     lumpectomy, benign    CARPAL TUNNEL RELEASE  1983   bilateral    CERVICAL DISCECTOMY  August 2012   [place]    Family History  Problem Relation Age of Onset   Heart disease Mother        Atrial fibrillation   Coronary artery disease Mother    Cancer Maternal Grandmother        colon Ca   Cancer Maternal Grandfather        Colon CA   Macular degeneration Father    Congestive Heart Failure Father    Breast cancer Neg Hx     SOCIAL HX:  reports that she has never smoked. She  has never used smokeless tobacco. She reports current alcohol use of about 2.0 standard drinks of alcohol per week. She reports that she does not use drugs.    Current Outpatient Medications:    aspirin EC 81 MG tablet, Take 81 mg by mouth daily. Swallow whole., Disp: , Rfl:    Biotin 5000 MCG CAPS, Take 1 capsule by mouth. Daily, Disp: , Rfl:    chlorpheniramine-HYDROcodone (TUSSIONEX) 10-8 MG/5ML, Take 5 mLs by mouth every 12 (twelve) hours as needed for cough., Disp: 140 mL, Rfl: 0   Cholecalciferol (VITAMIN D-3 PO), Take 2,000 Units by mouth., Disp: , Rfl:    CITRUS BERGAMOT PO, , Disp: , Rfl:    Cranberry-Vitamin C (CRANBERRY CONCENTRATE/VITAMINC PO), Take 4,200 mg by mouth daily., Disp: , Rfl:    doxycycline (VIBRA-TABS) 100 MG tablet, Take 1  tablet (100 mg total) by mouth 2 (two) times daily., Disp: 20 tablet, Rfl: 0   Magnesium 400 MG CAPS, Take 1 capsule by mouth daily., Disp: , Rfl:    Multiple Vitamins-Minerals (PRESERVISION AREDS 2 PO), Take 1 capsule by mouth., Disp: , Rfl:    naproxen (NAPROSYN) 250 MG tablet, Take 250 mg by mouth 2 (two) times daily with a meal., Disp: , Rfl:    ondansetron (ZOFRAN-ODT) 8 MG disintegrating tablet, TAKE 1 TABLET EVERY 6 HOURS AS NEEDED FOR NAUSEA AND VOMITING, Disp: 30 tablet, Rfl: 2   PHENObarbital (LUMINAL) 60 MG tablet, Take 1 tablet (60 mg total) by mouth daily., Disp: 90 tablet, Rfl: 3   predniSONE (DELTASONE) 10 MG tablet, 6 tablets daily for 3 days, then reduce by 1 tablet daily until gone, Disp: 33 tablet, Rfl: 0   Probiotic Product (SOLUBLE FIBER/PROBIOTICS PO), Take 1 capsule by mouth daily., Disp: , Rfl:    raloxifene (EVISTA) 60 MG tablet, TAKE 1 TABLET BY MOUTH EVERY DAY, Disp: 90 tablet, Rfl: 1   umeclidinium-vilanterol (ANORO ELLIPTA) 62.5-25 MCG/INH AEPB, Inhale 1 puff into the lungs daily., Disp: 60 each, Rfl: 6   vitamin B-12 (CYANOCOBALAMIN) 1000 MCG tablet, Take 1,000 mcg by mouth daily., Disp: , Rfl:    zolpidem (AMBIEN) 10 MG tablet, Take 1 tablet (10 mg total) by mouth at bedtime as needed. for sleep, Disp: 30 tablet, Rfl: 5   rizatriptan (MAXALT) 10 MG tablet, TAKE 1 TABLET BY MOUTH AS NEEDED FOR MIGRAINE. MAY REPEAT IN 2 HOURS IF NEEDED, Disp: 9 tablet, Rfl: 4  EXAM:  VITALS per patient if applicable:  GENERAL: alert, oriented, appears well and in no acute distress  HEENT: atraumatic, conjunttiva clear, no obvious abnormalities on inspection of external nose and ears  NECK: normal movements of the head and neck  LUNGS: on inspection no signs of respiratory distress, breathing rate appears normal, no obvious gross SOB, gasping or wheezing  CV: no obvious cyanosis  MS: moves all visible extremities without noticeable abnormality  PSYCH/NEURO: pleasant and  cooperative, no obvious depression or anxiety, speech and thought processing grossly intact  ASSESSMENT AND PLAN: Pneumonitis Assessment & Plan: Secondary to COVID infection.  She is recovering as expected , making sputum , afebrile and oxygenating well.  Tussionex refilled for cough suppression    Cardiomegaly Assessment & Plan: She has a chest x ray suggesting CM.  She has no risk factors for CHF or LVH and no signs or symptoms .  CT chest has been ordered by her [facility] pulmonologist.    Bronchitis, chronic obstructive w acute bronchitis ([place]) Assessment & Plan: Refilling doxycycline and prednisone  for future use given her condition of bronchiectasis    Other orders -     Rizatriptan Benzoate; TAKE 1 TABLET BY MOUTH AS NEEDED FOR MIGRAINE. MAY REPEAT IN 2 HOURS IF NEEDED  Dispense: 9 tablet; Refill: 4 -     Doxycycline Hyclate; Take 1 tablet (100 mg total) by mouth 2 (two) times daily.  Dispense: 20 tablet; Refill: 0 -     predniSONE; 6 tablets daily for 3 days, then reduce by 1 tablet daily until gone  Dispense: 33 tablet; Refill: 0 -     Hydrocod Poli-Chlorphe Poli ER; Take 5 mLs by mouth every 12 (twelve) hours as needed for cough.  Dispense: 140 mL; Refill: 0      I discussed the assessment and treatment plan with the patient. The patient was provided an opportunity to ask questions and all were answered. The patient agreed with the plan and demonstrated an understanding of the instructions.   The patient was advised to call back or seek an in-person evaluation if the symptoms worsen or if the condition fails to improve as anticipated.   I spent 30 minutes dedicated to the care of this patient on the date of this encounter to include pre-visit review of her medical history, including recent chest x rays,  pulmonary function tests and CT chest from 2021.  Face-to-face time with the patient , and post visit ordering of testing and therapeutics.    Crecencio Mc, MD

## 2022-05-03 NOTE — Addendum Note (Signed)
Addended by: Crecencio Mc on: 05/03/2022 09:02 PM   Modules accepted: Orders

## 2022-05-03 NOTE — Telephone Encounter (Signed)
Received a fax from [facility] that the Hydrocodone that was sent in today is out of stock. They are requesting a alternate medication.

## 2022-05-03 NOTE — Assessment & Plan Note (Signed)
She has a chest x ray suggesting CM.  She has no risk factors for CHF or LVH and no signs or symptoms .  CT chest has been ordered by her [facility] pulmonologist.

## 2022-05-03 NOTE — Assessment & Plan Note (Signed)
Secondary to COVID infection.  She is recovering as expected , making sputum , afebrile and oxygenating well.  Tussionex refilled for cough suppression

## 2022-05-03 NOTE — Assessment & Plan Note (Signed)
Refilling doxycycline and prednisone for future use given her condition of bronchiectasis

## 2022-07-24 LAB — HM MAMMOGRAPHY

## 2022-07-25 ENCOUNTER — Encounter: Payer: Self-pay | Admitting: Internal Medicine

## 2022-08-20 ENCOUNTER — Other Ambulatory Visit: Payer: Self-pay | Admitting: Internal Medicine

## 2022-08-31 ENCOUNTER — Encounter: Payer: Self-pay | Admitting: Internal Medicine

## 2022-09-02 MED ORDER — TETANUS-DIPHTH-ACELL PERTUSSIS 5-2.5-18.5 LF-MCG/0.5 IM SUSP: 0.5000 mL | Freq: Once | INTRAMUSCULAR | 0 refills | Status: AC

## 2022-09-27 ENCOUNTER — Telehealth: Payer: Self-pay | Admitting: Internal Medicine

## 2022-09-27 ENCOUNTER — Other Ambulatory Visit: Payer: Self-pay | Admitting: Internal Medicine

## 2022-09-27 DIAGNOSIS — G40909 Epilepsy, unspecified, not intractable, without status epilepticus: Secondary | ICD-10-CM

## 2022-09-27 MED ORDER — PHENOBARBITAL 60 MG PO TABS
60.0000 mg | ORAL_TABLET | Freq: Every day | ORAL | 3 refills | Status: DC
Start: 2022-09-27 — End: 2022-11-22

## 2022-09-27 NOTE — Telephone Encounter (Signed)
Prescription Request  09/27/2022  LOV: 03/07/2022  What is the name of the medication or equipment? PHENObarbital   Have you contacted your pharmacy to request a refill? Yes   Which pharmacy would you like this sent to?  [id number] - [place], [place] - [address] [place] [address] Phone: [phone] Fax: [phone]    Patient notified that their request is being sent to the clinical staff for review and that they should receive a response within 2 business days.   Please advise at Mobile [phone] (mobile)

## 2022-09-27 NOTE — Telephone Encounter (Signed)
Medication was refilled today.

## 2022-09-28 NOTE — Telephone Encounter (Signed)
Last filled on 05/03/22 #9 tabs/ 4 refills

## 2022-11-08 ENCOUNTER — Other Ambulatory Visit: Payer: Self-pay | Admitting: Internal Medicine

## 2022-11-08 DIAGNOSIS — J479 Bronchiectasis, uncomplicated: Secondary | ICD-10-CM

## 2022-11-08 MED ORDER — LEVOFLOXACIN 500 MG PO TABS: 500.0000 mg | ORAL_TABLET | Freq: Every day | ORAL | 0 refills | Status: AC

## 2022-11-08 NOTE — Assessment & Plan Note (Addendum)
Previously  managed by Dr Jayme Cloud in [place] and Dr Marca Ancona in [place].   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  She is using Anoro 3 days per week    . Treating current c/o purulent sputum with levaquin x 7 days

## 2022-11-22 ENCOUNTER — Encounter: Payer: Self-pay | Admitting: Internal Medicine

## 2022-11-22 DIAGNOSIS — G40909 Epilepsy, unspecified, not intractable, without status epilepticus: Secondary | ICD-10-CM

## 2022-11-22 MED ORDER — PHENOBARBITAL 60 MG PO TABS
60.0000 mg | ORAL_TABLET | Freq: Every day | ORAL | 3 refills | Status: DC
Start: 2022-11-22 — End: 2022-12-07

## 2022-12-03 ENCOUNTER — Other Ambulatory Visit: Payer: Self-pay | Admitting: Internal Medicine

## 2022-12-07 ENCOUNTER — Other Ambulatory Visit: Payer: Self-pay | Admitting: Internal Medicine

## 2022-12-07 DIAGNOSIS — G40909 Epilepsy, unspecified, not intractable, without status epilepticus: Secondary | ICD-10-CM

## 2022-12-07 MED ORDER — PHENOBARBITAL 60 MG PO TABS
60.0000 mg | ORAL_TABLET | Freq: Every day | ORAL | 0 refills | Status: DC
Start: 2022-12-07 — End: 2023-06-12

## 2022-12-08 ENCOUNTER — Telehealth: Payer: Self-pay

## 2022-12-08 NOTE — Telephone Encounter (Signed)
Pt called back and she was able to get her meds refilled

## 2022-12-08 NOTE — Telephone Encounter (Signed)
LMTCB. Need to find out if pt was able to get the Phenobarbital filled today.

## 2022-12-08 NOTE — Telephone Encounter (Signed)
noted 

## 2023-01-19 ENCOUNTER — Telehealth: Payer: Self-pay | Admitting: Internal Medicine

## 2023-01-19 NOTE — Telephone Encounter (Signed)
Copied from CRM [id number]. Topic: Medicare AWV >> Jan 19, 2023 10:35 AM Payton Doughty wrote: Reason for CRM: Called LVM 01/18/2023 to schedule Annual Wellness Visit  Verlee Rossetti; Care Guide Ambulatory Clinical Support [place] [facility] Direct Dial: [phone]

## 2023-01-20 ENCOUNTER — Encounter: Payer: Self-pay | Admitting: Internal Medicine

## 2023-01-20 DIAGNOSIS — E785 Hyperlipidemia, unspecified: Secondary | ICD-10-CM

## 2023-01-20 DIAGNOSIS — R7301 Impaired fasting glucose: Secondary | ICD-10-CM

## 2023-01-20 DIAGNOSIS — R5383 Other fatigue: Secondary | ICD-10-CM

## 2023-01-25 NOTE — Telephone Encounter (Signed)
Orders have been placed printed and put in quick sign folder for signature.

## 2023-02-02 LAB — LAB REPORT - SCANNED
Albumin, Urine POC: <3
Albumin/Creatinine Ratio, Urine, POC: <8
Creatinine, POC: 37.9 mg/dL

## 2023-02-03 ENCOUNTER — Encounter: Payer: Self-pay | Admitting: Internal Medicine

## 2023-02-18 ENCOUNTER — Other Ambulatory Visit: Payer: Self-pay | Admitting: Internal Medicine

## 2023-02-22 ENCOUNTER — Ambulatory Visit: Payer: Medicare Other | Admitting: *Deleted

## 2023-02-22 VITALS — Ht 66.5 in | Wt 136.0 lb

## 2023-02-22 DIAGNOSIS — Z0000 Encounter for general adult medical examination without abnormal findings: Secondary | ICD-10-CM | POA: Diagnosis not present

## 2023-02-22 NOTE — Patient Instructions (Signed)
Barbara Odom , Thank you for taking time to come for your Medicare Wellness Visit. I appreciate your ongoing commitment to your health goals. Please review the following plan we discussed and let me know if I can assist you in the future.   Referrals/Orders/Follow-Ups/Clinician Recommendations: None  This is a list of the screening recommended for you and due dates:  Health Maintenance  Topic Date Due   COVID-19 Vaccine (6 - 2023-24 season) 11/20/2022   Mammogram  07/24/2023   Medicare Annual Wellness Visit  02/22/2024   Colon Cancer Screening  12/28/2031   DTaP/Tdap/Td vaccine (3 - Td or Tdap) 11/23/2032   Pneumonia Vaccine  Completed   Flu Shot  Completed   DEXA scan (bone density measurement)  Completed   Hepatitis C Screening  Completed   Zoster (Shingles) Vaccine  Completed   HPV Vaccine  Aged Out    Advanced directives: (Copy Requested) Please bring a copy of your health care power of attorney and living will to the office to be added to your chart at your convenience.  Next Medicare Annual Wellness Visit scheduled for next year: Yes 02/27/24 @ 3:00

## 2023-02-22 NOTE — Progress Notes (Signed)
Subjective:   Barbara Odom is a 72 y.o. female who presents for Medicare Annual (Subsequent) preventive examination.  Visit Complete: Virtual I connected with  Barbara Odom on 02/22/23 by a audio enabled telemedicine application and verified that I am speaking with the correct person using two identifiers.  Patient Location: Home  Provider Location: Office/Clinic  I discussed the limitations of evaluation and management by telemedicine. The patient expressed understanding and agreed to proceed.  Vital Signs: Because this visit was a virtual/telehealth visit, some criteria may be missing or patient reported. Any vitals not documented were not able to be obtained and vitals that have been documented are patient reported.  Patient Medicare AWV questionnaire was completed by the patient on 02/18/23; I have confirmed that all information answered by patient is correct and no changes since this date.  Cardiac Risk Factors include: advanced age (>41men, >62 women)     Objective:    Today's Vitals   02/22/23 1335  Weight: 136 lb (61.7 kg)  Height: 5' 6.5" (1.689 m)   Body mass index is 21.62 kg/m.     02/22/2023    1:49 PM 01/24/2022   12:37 PM 01/08/2021    1:55 PM 01/08/2020    1:17 PM 12/20/2019    3:44 PM 01/07/2019    1:14 PM 09/29/2017   12:19 PM  Advanced Directives  Does Patient Have a Medical Advance Directive? Yes Yes Yes Yes Yes Yes No  Type of Estate agent of [place];Living will   Healthcare Power of [place];Living will Healthcare Power of [place];Living will Healthcare Power of [place];Living will   Does patient want to make changes to medical advance directive?  No - Patient declined No - Patient declined   No - Patient declined   Copy of Healthcare Power of Attorney in Chart? No - copy requested   No - copy requested  No - copy requested     Current Medications (verified) Outpatient Encounter Medications as of 02/22/2023   Medication Sig   aspirin EC 81 MG tablet Take 81 mg by mouth daily. Swallow whole.   Biotin 5000 MCG CAPS Take 1 capsule by mouth. Daily   budesonide-formoterol (SYMBICORT) 160-4.5 MCG/ACT inhaler Inhale 2 puffs into the lungs 2 (two) times daily.   Cholecalciferol (VITAMIN D-3 PO) Take 2,000 Units by mouth.   CITRUS BERGAMOT PO    Cranberry-Vitamin C (CRANBERRY CONCENTRATE/VITAMINC PO) Take 4,200 mg by mouth daily.   Multiple Vitamins-Minerals (PRESERVISION AREDS 2 PO) Take 1 capsule by mouth.   ondansetron (ZOFRAN-ODT) 8 MG disintegrating tablet TAKE 1 TABLET EVERY 6 HOURS AS NEEDED FOR NAUSEA AND VOMITING   PHENObarbital (LUMINAL) 60 MG tablet Take 1 tablet (60 mg total) by mouth daily.   Probiotic Product (SOLUBLE FIBER/PROBIOTICS PO) Take 1 capsule by mouth daily.   raloxifene (EVISTA) 60 MG tablet TAKE 1 TABLET BY MOUTH EVERY DAY   rizatriptan (MAXALT) 10 MG tablet TAKE 1 TABLET BY MOUTH AS NEEDED FOR MIGRAINE. MAY REPEAT IN 2 HOURS IF NEEDED   vitamin B-12 (CYANOCOBALAMIN) 1000 MCG tablet Take 1,000 mcg by mouth daily.   zolpidem (AMBIEN) 10 MG tablet TAKE 1 TABLET (10 MG TOTAL) BY MOUTH AT BEDTIME AS NEEDED. FOR SLEEP   [DISCONTINUED] chlorpheniramine-HYDROcodone (TUSSIONEX) 10-8 MG/5ML Take 5 mLs by mouth every 12 (twelve) hours as needed for cough.   [DISCONTINUED] doxycycline (VIBRA-TABS) 100 MG tablet Take 1 tablet (100 mg total) by mouth 2 (two) times daily.   [DISCONTINUED] guaiFENesin-codeine (CHERATUSSIN  AC) 100-10 MG/5ML syrup Take 5 mLs by mouth 3 (three) times daily as needed for cough.   [DISCONTINUED] Magnesium 400 MG CAPS Take 1 capsule by mouth daily.   [DISCONTINUED] naproxen (NAPROSYN) 250 MG tablet Take 250 mg by mouth 2 (two) times daily with a meal.   [DISCONTINUED] predniSONE (DELTASONE) 10 MG tablet 6 tablets daily for 3 days, then reduce by 1 tablet daily until gone   [DISCONTINUED] umeclidinium-vilanterol (ANORO ELLIPTA) 62.5-25 MCG/INH AEPB Inhale 1 puff into  the lungs daily.   No facility-administered encounter medications on file as of 02/22/2023.    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   BREAST SURGERY     lumpectomy, benign    CARPAL TUNNEL RELEASE  1983   bilateral    CERVICAL DISCECTOMY  August 2012   [place]   Family History  Problem Relation Age of Onset   Heart disease Mother        Atrial fibrillation   Coronary artery disease Mother    Cancer Maternal Grandmother        colon Ca   Cancer Maternal Grandfather        Colon CA   Macular degeneration Father    Congestive Heart Failure Father    Breast cancer Neg Hx    Social History   Socioeconomic History   Marital status: Married    Spouse name: Not on file   Number of children: Not on file   Years of education: Not on file   Highest education level: Not on file  Occupational History   Occupation: BEHAVIOR MED    Employer: [employer]  Tobacco Use   Smoking status: Never   Smokeless tobacco: Never  Vaping Use   Vaping status: Never Used  Substance and Sexual Activity   Alcohol use: Yes    Alcohol/week: 2.0 standard drinks of alcohol    Types: 1 Cans of beer, 1 Shots of liquor per week   Drug use: No   Sexual activity: Yes    Partners: Male    Comment: 1st intercourse- 89, partners- 1, married- 40 yrs   Other Topics Concern   Not on file  Social History  Narrative   Married   Social Determinants of Health   Financial Resource Strain: Low Risk  (02/18/2023)   Overall Financial Resource Strain (CARDIA)    Difficulty of Paying Living Expenses: Not hard at all  Food Insecurity: No Food Insecurity (02/18/2023)   Hunger Vital Sign    Worried About Running Out of Food in the Last Year: Never true    Ran Out of Food in the Last Year: Never true  Transportation Needs: No Transportation Needs (02/18/2023)   PRAPARE - Administrator, Civil Service (Medical): No    Lack of Transportation (Non-Medical): No  Physical Activity: Sufficiently  Active (02/18/2023)   Exercise Vital Sign    Days of Exercise per Week: 6 days    Minutes of Exercise per Session: 60 min  Stress: No Stress Concern Present (02/18/2023)   [employer] of Occupational Health - Occupational Stress Questionnaire    Feeling of Stress : Not at all  Social Connections: Moderately Integrated (02/18/2023)   Social Connection and Isolation Panel [NHANES]    Frequency of Communication with Friends and Family: More than three times a week    Frequency of Social Gatherings with Friends and Family: More than three times a week    Attends Religious Services: Never    Database administrator or Organizations: Yes    Attends Engineer, structural: More than 4 times per year    Marital Status: Married    Tobacco Counseling Counseling given: Not Answered   Clinical Intake:  Pre-visit preparation completed: Yes  Pain : No/denies pain     BMI - recorded: 21.62 Nutritional Status: BMI of 19-24  Normal Nutritional Risks: None Diabetes: No  How often do you need to have someone help you when you read instructions, pamphlets, or other written materials from your doctor or pharmacy?: 1 - Never  Interpreter Needed?: No  Information entered by :: R. Nicki Gracy LPN   Activities of Daily Living    02/18/2023    7:55 PM  In your present state of health, do you  have any difficulty performing the following activities:  Hearing? 0  Vision? 0  Comment glasses and readers  Difficulty concentrating or making decisions? 0  Walking or climbing stairs? 0  Dressing or bathing? 0  Doing errands, shopping? 0  Preparing Food and eating ? N  Using the Toilet? N  In the past six months, have you accidently leaked urine? N  Do you have problems with loss of bowel control? N  Managing your Medications? N  Managing your Finances? N  Housekeeping or managing your Housekeeping? N    Patient Care Team: Sherlene Shams, MD as PCP - General (Internal Medicine)  Indicate any recent Medical Services you may have received from other than [facility] providers in the past year (date may be approximate).     Assessment:   This is a routine wellness examination for [place].  Hearing/Vision screen Hearing Screening - Comments:: No issues Vision Screening - Comments:: Glasses and readers   Goals Addressed             This Visit's Progress    Patient Stated       Wants to get herself in better shape        Depression Screen    02/22/2023    1:45 PM 05/03/2022   11:01 AM 03/07/2022    1:32 PM 01/24/2022   12:39 PM 11/15/2021    4:21 PM 02/23/2021    1:31 PM 01/08/2021    1:35 PM  PHQ 2/9 Scores  PHQ - 2 Score 0 0 0 0 0 0 0  PHQ- 9 Score 0          Fall Risk    02/18/2023    7:55 PM 05/03/2022   11:01 AM 03/07/2022    1:32 PM 01/24/2022   12:39 PM 11/15/2021    4:21 PM  Fall Risk   Falls in the past year? 0 0 0 0 0  Number falls in past yr: 0 0  0 0  Injury with Fall? 0 0  0 0  Risk for  fall due to : No Fall Risks No Fall Risks No Fall Risks  No Fall Risks  Follow up Falls prevention discussed;Falls evaluation completed Falls evaluation completed Falls evaluation completed Falls evaluation completed;Falls prevention discussed Falls evaluation completed    MEDICARE RISK AT HOME: Medicare Risk at Home Any stairs in or around the home?: Yes If  so, are there any without handrails?: No Home free of loose throw rugs in walkways, pet beds, electrical cords, etc?: Yes Adequate lighting in your home to reduce risk of falls?: Yes Life alert?: No Use of a cane, Dancer or w/c?: No Grab bars in the bathroom?: No Shower chair or bench in shower?: Yes Elevated toilet seat or a handicapped toilet?: Yes  Cognitive Function:        02/22/2023    1:51 PM 02/22/2023    1:49 PM 01/24/2022   12:44 PM 01/07/2019    1:39 PM  6CIT Screen  What Year? 0 points 0 points  0 points  What month? 0 points 0 points  0 points  What time? 0 points 0 points  0 points  Count back from 20 0 points 0 points  0 points  Months in reverse 0 points  0 points 0 points  Repeat phrase 0 points   0 points  Total Score 0 points   0 points    Immunizations Immunization History  Administered Date(s) Administered   Fluad Quad(high Dose 65+) 01/02/2019, 01/10/2020, 12/24/2020   Influenza Split 02/05/2012, 12/19/2012   Influenza, High Dose Seasonal PF 12/20/2021   Moderna Covid-19 Vaccine Bivalent Booster 54yrs & up 01/28/2021   PFIZER(Purple Top)SARS-COV-2 Vaccination 05/03/2019, 05/28/2019, 01/13/2020, 08/06/2020   Pneumococcal Conjugate-13 02/26/2019   Pneumococcal Polysaccharide-23 03/26/2020   Respiratory Syncytial Virus Vaccine,Recomb Aduvanted(Arexvy) 02/15/2022   Tdap 01/23/2013   Zoster Recombinant(Shingrix) 01/29/2019, 04/24/2020    TDAP status: Up to date  Flu Vaccine status: Up to date  Pneumococcal vaccine status: Up to date  Covid-19 vaccine status: Information provided on how to obtain vaccines.   Qualifies for Shingles Vaccine? Yes   Zostavax completed No   Shingrix Completed?: Yes  Screening Tests Health Maintenance  Topic Date Due   COVID-19 Vaccine (6 - 2023-24 season) 11/20/2022   DTaP/Tdap/Td (2 - Td or Tdap) 01/24/2023   Medicare Annual Wellness (AWV)  01/25/2023   MAMMOGRAM  07/24/2023   Colonoscopy  12/28/2031   Pneumonia  Vaccine 68+ Years old  Completed   INFLUENZA VACCINE  Completed   DEXA SCAN  Completed   Hepatitis C Screening  Completed   Zoster Vaccines- Shingrix  Completed   HPV VACCINES  Aged Out    Health Maintenance  Health Maintenance Due  Topic Date Due   COVID-19 Vaccine (6 - 2023-24 season) 11/20/2022   DTaP/Tdap/Td (2 - Td or Tdap) 01/24/2023   Medicare Annual Wellness (AWV)  01/25/2023    Colorectal cancer screening: Type of screening: Colonoscopy. Completed 12/2021. Repeat every 10 years  Mammogram status: Completed 07/2022. Repeat every year  Bone Density status: Completed 04/13/21. Results reflect: Bone density results: OSTEOPENIA. Repeat every 2 years.  Lung Cancer Screening: (Low Dose CT Chest recommended if Age 59-80 years, 20 pack-year currently smoking OR have quit w/in 15years.) does not qualify.   Additional Screening:  Hepatitis C Screening: does qualify; Completed 11/2014  Vision Screening: Recommended annual ophthalmology exams for early detection of glaucoma and other disorders of the eye. Is the patient up to date with their annual eye exam?  Yes  Who is  the provider or what is the name of the office in which the patient attends annual eye exams? [facility] If pt is not established with a provider, would they like to be referred to a provider to establish care? No .   Dental Screening: Recommended annual dental exams for proper oral hygiene   Community Resource Referral / Chronic Care Management: CRR required this visit?  No   CCM required this visit?  No     Plan:     I have personally reviewed and noted the following in the patient's chart:   Medical and social history Use of alcohol, tobacco or illicit drugs  Current medications and supplements including opioid prescriptions. Patient is not currently taking opioid prescriptions. Functional ability and status Nutritional status Physical activity Advanced directives List of other  physicians Hospitalizations, surgeries, and ER visits in previous 12 months Vitals Screenings to include cognitive, depression, and falls Referrals and appointments  In addition, I have reviewed and discussed with patient certain preventive protocols, quality metrics, and best practice recommendations. A written personalized care plan for preventive services as well as general preventive health recommendations were provided to patient.     Sydell Axon, LPN   16/03/958   After Visit Summary: (MyChart) Due to this being a telephonic visit, the after visit summary with patients personalized plan was offered to patient via MyChart   Nurse Notes: None

## 2023-03-08 ENCOUNTER — Ambulatory Visit (INDEPENDENT_AMBULATORY_CARE_PROVIDER_SITE_OTHER): Payer: Medicare Other | Admitting: Internal Medicine

## 2023-03-08 ENCOUNTER — Encounter: Payer: Self-pay | Admitting: Internal Medicine

## 2023-03-08 ENCOUNTER — Encounter: Payer: Medicare Other | Admitting: Internal Medicine

## 2023-03-08 VITALS — BP 122/72 | HR 81 | Ht 66.5 in | Wt 140.4 lb

## 2023-03-08 DIAGNOSIS — Z0000 Encounter for general adult medical examination without abnormal findings: Secondary | ICD-10-CM

## 2023-03-08 DIAGNOSIS — M85851 Other specified disorders of bone density and structure, right thigh: Secondary | ICD-10-CM

## 2023-03-08 DIAGNOSIS — M85852 Other specified disorders of bone density and structure, left thigh: Secondary | ICD-10-CM | POA: Diagnosis not present

## 2023-03-08 DIAGNOSIS — E785 Hyperlipidemia, unspecified: Secondary | ICD-10-CM | POA: Diagnosis not present

## 2023-03-08 NOTE — Assessment & Plan Note (Signed)
10 YR RISK USING THE aha crc IS 7% USING NOV 2024 LIPID PANEL

## 2023-03-08 NOTE — Progress Notes (Unsigned)
Patient ID: Barbara Odom, female    DOB: 1950/07/30  Age: 72 y.o. MRN: [id number]  The patient is here for annual preventive examination and management of other chronic and acute problems.   The risk factors are reflected in the social history.   The roster of all physicians providing medical care to patient - is listed in the Snapshot section of the chart.   Activities of daily living:  The patient is 100% independent in all ADLs: dressing, toileting, feeding as well as independent mobility   Home safety : The patient has smoke detectors in the home. They wear seatbelts.  There are no unsecured firearms at home. There is no violence in the home.    There is no risks for hepatitis, STDs or HIV. There is no   history of blood transfusion. They have no travel history to infectious disease endemic areas of the world.   The patient has seen their dentist in the last six month. They have seen their eye doctor in the last year. The patinet  denies slight hearing difficulty with regard to whispered voices and some television programs.  They have deferred audiologic testing in the last year.  They do not  have excessive sun exposure. Discussed the need for sun protection: hats, long sleeves and use of sunscreen if there is significant sun exposure.    Diet: the importance of a healthy diet is discussed. They do have a healthy diet.   The benefits of regular aerobic exercise were discussed. The patient  exercises  3 to 5 days per week  for  60 minutes.    Depression screen: there are no signs or vegative symptoms of depression- irritability, change in appetite, anhedonia, sadness/tearfullness.   The following portions of the patient's history were reviewed and updated as appropriate: allergies, current medications, past family history, past medical history,  past surgical history, past social history  and problem list.   Visual acuity was not assessed per patient preference since the patient has  regular follow up with an  ophthalmologist. Hearing and body mass index were assessed and reviewed.    During the course of the visit the patient was educated and counseled about appropriate screening and preventive services including : fall prevention , diabetes screening, nutrition counseling, colorectal cancer screening, and recommended immunizations.    Chief Complaint:    none  Review of Symptoms  Patient denies headache, fevers, malaise, unintentional weight loss, skin rash, eye pain, sinus congestion and sinus pain, sore throat, dysphagia,  hemoptysis , cough, dyspnea, wheezing, chest pain, palpitations, orthopnea, edema, abdominal pain, nausea, melena, diarrhea, constipation, flank pain, dysuria, hematuria, urinary  Frequency, nocturia, numbness, tingling, seizures,  Focal weakness, Loss of consciousness,  Tremor, insomnia, depression, anxiety, and suicidal ideation.    Physical Exam:  BP 122/72   Pulse 81   Ht 5' 6.5" (1.689 m)   Wt 140 lb 6.4 oz (63.7 kg)   SpO2 98%   BMI 22.32 kg/m    Physical Exam Vitals reviewed.  Constitutional:      General: She is not in acute distress.    Appearance: Normal appearance. She is normal weight. She is not ill-appearing, toxic-appearing or diaphoretic.  HENT:     Head: Normocephalic.  Eyes:     General: No scleral icterus.       Right eye: No discharge.        Left eye: No discharge.     Conjunctiva/sclera: Conjunctivae normal.  Cardiovascular:  Rate and Rhythm: Normal rate and regular rhythm.     Heart sounds: Normal heart sounds.  Pulmonary:     Effort: Pulmonary effort is normal. No respiratory distress.     Breath sounds: Normal breath sounds.  Musculoskeletal:        General: Normal range of motion.  Skin:    General: Skin is warm and dry.  Neurological:     General: No focal deficit present.     Mental Status: She is alert and oriented to person, place, and time. Mental status is at baseline.  Psychiatric:         Mood and Affect: Mood normal.        Behavior: Behavior normal.        Thought Content: Thought content normal.        Judgment: Judgment normal.    Assessment and Plan: Hyperlipidemia LDL goal <160 Assessment & Plan: 10 YR RISK USING THE aha crc IS 7% USING NOV 2024 LIPID PANEL      Osteopenia of necks of both femurs -     DG Bone Density; Future  Encounter for preventive health examination Assessment & Plan: age appropriate education and counseling updated, referrals for preventative services and immunizations addressed, dietary and smoking counseling addressed, most recent labs reviewed.  I have personally reviewed and have noted:   1) the patient's medical and social history 2) The pt's use of alcohol, tobacco, and illicit drugs 3) The patient's current medications and supplements 4) Functional ability including ADL's, fall risk, home safety risk, hearing and visual impairment 5) Diet and physical activities 6) Evidence for depression or mood disorder 7) The patient's height, weight, and BMI have been recorded in the chart   I have made referrals, and provided counseling and education based on review of the above      No follow-ups on file.  Sherlene Shams, MD

## 2023-03-09 NOTE — Assessment & Plan Note (Signed)

## 2023-04-03 ENCOUNTER — Encounter: Payer: Self-pay | Admitting: Internal Medicine

## 2023-04-03 DIAGNOSIS — E785 Hyperlipidemia, unspecified: Secondary | ICD-10-CM

## 2023-04-06 MED ORDER — ROSUVASTATIN CALCIUM 20 MG PO TABS: 20.0000 mg | ORAL_TABLET | ORAL | 0 refills | Status: DC

## 2023-04-10 NOTE — Addendum Note (Signed)
Addended by: Sherlene Shams on: 04/10/2023 04:49 PM   Modules accepted: Orders

## 2023-04-14 NOTE — Telephone Encounter (Signed)
noted

## 2023-04-24 ENCOUNTER — Encounter: Payer: Medicare Other | Admitting: Internal Medicine

## 2023-04-25 ENCOUNTER — Other Ambulatory Visit: Payer: Self-pay | Admitting: Internal Medicine

## 2023-05-15 ENCOUNTER — Encounter: Payer: Self-pay | Admitting: Internal Medicine

## 2023-05-16 NOTE — Telephone Encounter (Signed)
 I have reprinted orders and placed in quick sign folder

## 2023-05-25 ENCOUNTER — Encounter: Payer: Self-pay | Admitting: Internal Medicine

## 2023-05-26 ENCOUNTER — Other Ambulatory Visit (HOSPITAL_COMMUNITY): Payer: Self-pay

## 2023-05-26 ENCOUNTER — Telehealth: Payer: Self-pay

## 2023-05-26 MED ORDER — DOXYCYCLINE HYCLATE 100 MG PO TABS: 100.0000 mg | ORAL_TABLET | Freq: Two times a day (BID) | ORAL | 0 refills | Status: DC

## 2023-05-26 MED ORDER — SCOPOLAMINE 1 MG/3DAYS TD PT72: 1.0000 | MEDICATED_PATCH | TRANSDERMAL | 0 refills | Status: DC

## 2023-05-26 NOTE — Telephone Encounter (Signed)
 Pharmacy Patient Advocate Encounter   Received notification from CoverMyMeds that prior authorization for Scopolamine 1MG /3DAYS 72 hr patches  is required/requested.   Insurance verification completed.   The patient is insured through [employer] [facility] .   Per test claim: PA required; PA started via CoverMyMeds. KEY [id number]. SENT TO PLAN

## 2023-05-28 ENCOUNTER — Encounter: Payer: Self-pay | Admitting: Internal Medicine

## 2023-05-29 ENCOUNTER — Encounter: Payer: Self-pay | Admitting: Internal Medicine

## 2023-05-30 ENCOUNTER — Other Ambulatory Visit (HOSPITAL_COMMUNITY): Payer: Self-pay

## 2023-05-30 ENCOUNTER — Encounter: Payer: Self-pay | Admitting: Internal Medicine

## 2023-05-30 NOTE — Telephone Encounter (Signed)
 Pharmacy Patient Advocate Encounter  Received notification from  [facility] Medicare that Prior Authorization for Scopolamine 1MG /3DAYS 72 hr patches has been DENIED.  No reason given;  PA #/Case ID/Reference #: [id number] Key: Providence Lanius CPhT Rx Patient Advocate [phone]

## 2023-05-31 ENCOUNTER — Encounter: Payer: Self-pay | Admitting: Internal Medicine

## 2023-06-01 MED ORDER — ONDANSETRON 4 MG PO TBDP: 4.0000 mg | ORAL_TABLET | Freq: Three times a day (TID) | ORAL | 0 refills | Status: DC | PRN

## 2023-06-11 ENCOUNTER — Other Ambulatory Visit: Payer: Self-pay | Admitting: Internal Medicine

## 2023-06-11 DIAGNOSIS — G40909 Epilepsy, unspecified, not intractable, without status epilepticus: Secondary | ICD-10-CM

## 2023-06-12 ENCOUNTER — Other Ambulatory Visit: Payer: Self-pay | Admitting: Internal Medicine

## 2023-06-14 ENCOUNTER — Other Ambulatory Visit: Payer: Self-pay | Admitting: Internal Medicine

## 2023-06-14 DIAGNOSIS — G40909 Epilepsy, unspecified, not intractable, without status epilepticus: Secondary | ICD-10-CM

## 2023-06-14 MED ORDER — PHENOBARBITAL 60 MG PO TABS: 60.0000 mg | ORAL_TABLET | Freq: Every day | ORAL | 1 refills | Status: DC

## 2023-08-02 ENCOUNTER — Encounter: Payer: Self-pay | Admitting: Internal Medicine

## 2023-08-02 ENCOUNTER — Other Ambulatory Visit: Payer: Self-pay | Admitting: Internal Medicine

## 2023-08-02 NOTE — Telephone Encounter (Signed)
 Refilled: 12/06/2022 Last OV: 03/08/2023 Next OV: 04/10/2024

## 2023-08-09 LAB — HM MAMMOGRAPHY

## 2023-09-05 ENCOUNTER — Other Ambulatory Visit: Payer: Self-pay | Admitting: Internal Medicine

## 2023-10-03 ENCOUNTER — Encounter: Payer: Self-pay | Admitting: Internal Medicine

## 2023-10-03 LAB — HM DEXA SCAN

## 2023-10-04 ENCOUNTER — Ambulatory Visit: Payer: Self-pay | Admitting: Internal Medicine

## 2023-10-27 ENCOUNTER — Other Ambulatory Visit: Payer: Self-pay | Admitting: Internal Medicine

## 2023-10-30 ENCOUNTER — Other Ambulatory Visit: Payer: Self-pay | Admitting: Internal Medicine

## 2023-11-01 ENCOUNTER — Ambulatory Visit: Admitting: Internal Medicine

## 2023-11-02 ENCOUNTER — Encounter: Payer: Self-pay | Admitting: Internal Medicine

## 2023-11-02 ENCOUNTER — Telehealth (INDEPENDENT_AMBULATORY_CARE_PROVIDER_SITE_OTHER): Admitting: Internal Medicine

## 2023-11-02 VITALS — BP 122/72 | Ht 66.5 in | Wt 136.0 lb

## 2023-11-02 DIAGNOSIS — G40909 Epilepsy, unspecified, not intractable, without status epilepticus: Secondary | ICD-10-CM | POA: Diagnosis not present

## 2023-11-02 DIAGNOSIS — Z79899 Other long term (current) drug therapy: Secondary | ICD-10-CM | POA: Diagnosis not present

## 2023-11-02 DIAGNOSIS — M816 Localized osteoporosis [Lequesne]: Secondary | ICD-10-CM

## 2023-11-02 DIAGNOSIS — E559 Vitamin D deficiency, unspecified: Secondary | ICD-10-CM

## 2023-11-02 MED ORDER — ALENDRONATE SODIUM 70 MG PO TABS: 70.0000 mg | ORAL_TABLET | ORAL | 11 refills | Status: AC

## 2023-11-02 NOTE — Assessment & Plan Note (Signed)
 Her seizure disorder was diagnosed during childhood and has been Managed since adolescence with phenobarbital .  She has been asymptomatic  and levels have been therapeutic

## 2023-11-02 NOTE — Patient Instructions (Addendum)
 We are changing your treatment for osteoporosis from Evista  to Fosamax    I don't like the idea of combining vitamin K with Evista   so wait until you are taking fosamax  to start the D/K combination   New lab orders will be sent to [facility]    the sleep aid I recommend trying was  Relaxium for insomnia  (as seen on TV commercials) . It is available through [employer] and contains all natural supplements:  Melatonin 5 mg  Chamomile 25 mg Passionflower extract 75 mg GABA 100 mg Ashwaganda extract 125 mg Magnesium citrate, glycinate, oxide (100 mg)  L tryptophan 500 mg Valerest (proprietary  ingredient ; probably valeria root extract)

## 2023-11-02 NOTE — Progress Notes (Signed)
 Virtual Visit via Caregility   Note   This format is felt to be most appropriate for this patient at this time.  All issues noted in this document were discussed and addressed.  No physical exam was performed (except for noted visual exam findings with Video Visits).   I connected with Barbara Odom  on 11/04/23 at  2:00 PM EDT by a video enabled telemedicine application or telephone and verified that I am speaking with the correct person using two identifiers. Location patient: home Location provider: work or home office Persons participating in the virtual visit: patient, provider  I discussed the limitations, risks, security and privacy concerns of performing an evaluation and management service by telephone and the availability of in person appointments. I also discussed with the patient that there may be a patient responsible charge related to this service. The patient expressed understanding and agreed to proceed.   Reason for visit: review of DEXA scan   HPI:  Barbara Odom is a 73 yr old female with a history of seizure disorder since childhood, managed with phenobarbital  for decades without seizure activity,  who presents for review of recent DEXA scan .  She has a history of alendronate  use for 1-2 years over ten years ago for management of osteopenia,  and has been taking Evista   since 2023 for management of osteoporosis without fractures.  She is taking vitmain d, supplementing calcium , and walking daily for exercise.  Her recent DEXA noted continued loss of BMD based on measurement of both hips (spine was excluded due to prior spinal fusion )   ROS: See pertinent positives and negatives per HPI.  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   BREAST SURGERY     lumpectomy, benign    CARPAL TUNNEL RELEASE  1983   bilateral    CERVICAL DISCECTOMY  10/2010   [facility]   EYE SURGERY  Bilateral strabismus surgery age [age]   SPINE SURGERY  Spinal fusion L4-5 Aug 8/22  r sciatic decompression    Family History  Problem Relation Age of Onset   Heart disease Mother        Atrial fibrillation   Coronary artery disease Mother    Hyperlipidemia Mother    Hypertension Mother    Cancer Maternal Grandmother        colon Ca   Stroke Maternal Grandmother    Cancer Maternal Grandfather        Colon CA   COPD Maternal Grandfather    Macular degeneration Father    Congestive Heart Failure Father    Kidney disease Father    Vision loss Father    Breast cancer Neg Hx     SOCIAL HX:  reports that she has never smoked. She has never used smokeless tobacco. She reports current alcohol use of  about 2.0 standard drinks of alcohol per week. She reports that she does not use drugs.    Current Outpatient Medications:    alendronate  (FOSAMAX ) 70 MG tablet, Take 1 tablet (70 mg total) by mouth every 7 (seven) days. Take with a full glass of water on an empty stomach., Disp: 4 tablet, Rfl: 11   aspirin  EC 81 MG tablet, Take 81 mg by mouth daily. Swallow whole., Disp: , Rfl:    budesonide -formoterol  (SYMBICORT ) 160-4.5 MCG/ACT inhaler, Inhale 2 puffs into the lungs 2 (two) times daily., Disp: , Rfl:    Cholecalciferol (VITAMIN D -3 PO), Take 2,000 Units by mouth., Disp: , Rfl:    Cranberry-Vitamin C (CRANBERRY CONCENTRATE/VITAMINC PO), Take 4,200 mg by mouth daily., Disp: , Rfl:    Multiple Vitamins-Minerals  (PRESERVISION AREDS PO), Take 1 tablet by mouth daily., Disp: , Rfl:    PHENObarbital  (LUMINAL) 60 MG tablet, Take 1 tablet (60 mg total) by mouth daily., Disp: 90 tablet, Rfl: 1   Probiotic Product (SOLUBLE FIBER/PROBIOTICS PO), Take 1 capsule by mouth daily., Disp: , Rfl:    raloxifene  (EVISTA ) 60 MG tablet, TAKE 1 TABLET BY MOUTH EVERY DAY, Disp: 90 tablet, Rfl: 1   rizatriptan  (MAXALT ) 10 MG tablet, TAKE 1 TABLET BY MOUTH AS NEEDED FOR MIGRAINE. MAY REPEAT IN 2 HOURS IF NEEDED, Disp: 9 tablet, Rfl: 4   rosuvastatin  (CRESTOR ) 20 MG tablet, TAKE 1 TABLET BY MOUTH EVERY OTHER DAY, Disp: 45 tablet, Rfl: 1   tretinoin (RETIN-A) 0.1 % cream, Apply 1 Application topically at bedtime., Disp: , Rfl:    vitamin B-12 (CYANOCOBALAMIN ) 1000 MCG tablet, Take 1,000 mcg by mouth daily., Disp: , Rfl:    zolpidem  (AMBIEN ) 10 MG tablet, TAKE 1 TABLET (10 MG TOTAL) BY MOUTH AT BEDTIME AS NEEDED. FOR SLEEP, Disp: 30 tablet, Rfl: 0  EXAM:  VITALS per patient if applicable:  GENERAL: alert, oriented, appears well and in no acute distress  HEENT: atraumatic, conjunttiva clear, no obvious abnormalities on inspection of external nose and ears  NECK: normal movements of the head and neck  LUNGS: on inspection no signs of respiratory distress, breathing rate appears normal, no obvious gross SOB, gasping or wheezing  CV: no obvious cyanosis  MS: moves all visible extremities without noticeable abnormality  PSYCH/NEURO: pleasant and cooperative, no obvious depression or anxiety, speech and thought processing grossly intact  ASSESSMENT AND PLAN: Seizure disorder [facility]) Assessment & Plan: Her seizure disorder was diagnosed during childhood and has been Managed since adolescence with phenobarbital .  She has been asymptomatic  and levels have been therapeutic    Vitamin D  deficiency -     VITAMIN D  25 Hydroxy (Vit-D Deficiency, Fractures) -     PTH, intact and calcium   Long-term current use of high risk  medication other than anticoagulant -     Comprehensive metabolic panel with GFR -     Phenobarbital  level  Localized osteoporosis without current pathological fracture Assessment & Plan: Continued loss of BMD by repeat DEXA, likely due to use of phenobarbital .  Changing therapy from Evista  to alendronate .  Checking vitamin D  and PTH    Other orders -     Alendronate  Sodium; Take 1 tablet (70 mg total) by mouth every 7 (seven) days. Take with a full glass of water on an empty stomach.  Dispense: 4 tablet; Refill: 11      I discussed the assessment and treatment plan with the patient. The patient was provided an opportunity to ask questions  and all were answered. The patient agreed with the plan and demonstrated an understanding of the instructions.   The patient was advised to call back or seek an in-person evaluation if the symptoms worsen or if the condition fails to improve as anticipated.   I spent 30 minutes dedicated to the care of this patient on the date of this encounter to include prior DEXA scans,   pre-visit review of patient's medical history,current  imaging studies and labs, face-to-face time with the patient , and post visit ordering of testing and therapeutics.    Verneita LITTIE Kettering, MD

## 2023-11-03 ENCOUNTER — Telehealth: Payer: Self-pay

## 2023-11-03 NOTE — Telephone Encounter (Signed)
 Lab orders faxed to [facility]  Fax: [phone]. Sent patient a mychart message to notify her that the orders were sent.

## 2023-11-03 NOTE — Telephone Encounter (Signed)
 Noted

## 2023-11-04 NOTE — Assessment & Plan Note (Signed)
 Continued loss of BMD by repeat DEXA, likely due to use of phenobarbital .  Changing therapy from Evista  to alendronate .  Checking vitamin D  and PTH

## 2023-11-16 LAB — LAB REPORT - SCANNED: EGFR: 80

## 2023-11-17 ENCOUNTER — Ambulatory Visit: Payer: Self-pay | Admitting: Internal Medicine

## 2023-11-17 ENCOUNTER — Encounter: Payer: Self-pay | Admitting: Internal Medicine

## 2023-11-21 ENCOUNTER — Encounter: Payer: Self-pay | Admitting: Internal Medicine

## 2023-12-04 ENCOUNTER — Other Ambulatory Visit: Payer: Self-pay | Admitting: Internal Medicine

## 2023-12-04 DIAGNOSIS — G40909 Epilepsy, unspecified, not intractable, without status epilepticus: Secondary | ICD-10-CM

## 2023-12-05 ENCOUNTER — Other Ambulatory Visit: Payer: Self-pay | Admitting: Internal Medicine

## 2023-12-26 NOTE — Progress Notes (Signed)
    Assessment & Plan:  1. Bronchiectasis with resolving acute exacerbation (HCC) (Primary)  2. Moderate persistent asthma without complication   Patient Instructions  1.  Prescription for doxycycline  was sent to your pharmacy.  2.  Follow-up in 3 months time.  Continue your same regimen.  Please note: late entry documentation due to logistical difficulties during COVID-19 pandemic. This note is filed for information purposes only, and is not intended to be used for billing, nor does it represent the full scope/nature of the visit in question. Please see any associated scanned media linked to date of encounter for additional pertinent information.  Subjective:    HPI: Barbara Odom is a 73 y.o. female presenting to the pulmonology clinic on 01/03/2019 with report of: Follow-up (F/U regarding Bronchiestasis. Has had a flu shot. She reports her breathing has been at her baseline. No concerns at this time. )     Outpatient Encounter Medications as of 01/03/2019  Medication Sig   Probiotic Product (SOLUBLE FIBER/PROBIOTICS PO) Take 1 capsule by mouth daily.   [DISCONTINUED] albuterol  (PROVENTIL  HFA;VENTOLIN  HFA) 108 (90 Base) MCG/ACT inhaler Inhale 1-2 puffs into the lungs every 4 (four) hours as needed for wheezing or shortness of breath.   [DISCONTINUED] calcium -vitamin D  (OSCAL WITH D) 500-200 MG-UNIT tablet Take 1 tablet by mouth 2 (two) times daily.   [DISCONTINUED] Cholecalciferol (VITAMIN D3) 1000 UNITS CAPS Take by mouth.   [DISCONTINUED] Cranberry 500 MG TABS Take 1 tablet by mouth daily.   [DISCONTINUED] diazepam  (VALIUM ) 5 MG tablet Take 5 mg by mouth every 8 (eight) hours as needed.   [DISCONTINUED] FLOVENT  HFA 110 MCG/ACT inhaler TAKE 2 PUFFS BY MOUTH TWICE A DAY   [DISCONTINUED] Multiple Vitamin (MULTIVITAMIN) tablet Take 1 tablet by mouth daily.   [DISCONTINUED] PHENObarbital  (LUMINAL) 60 MG tablet TAKE 1 TABLET BY MOUTH EVERY DAY   [DISCONTINUED] Respiratory  Therapy Supplies (FLUTTER) DEVI 1 each by Does not apply route 4 (four) times daily.   [DISCONTINUED] rizatriptan  (MAXALT ) 10 MG tablet TAKE 1 TABLET BY MOUTH AS NEEDED FOR MIGRAINE. MAY REPEAT IN 2 HOURS IF NEEDED   [DISCONTINUED] zolpidem  (AMBIEN ) 10 MG tablet TAKE 1/2 TABLET BY MOUTH EVERY DAY AT BEDTIME   [DISCONTINUED] doxycycline  (VIBRAMYCIN ) 100 MG capsule Take 1 capsule (100 mg total) by mouth 2 (two) times daily for 10 days.   [DISCONTINUED] fluconazole  (DIFLUCAN ) 150 MG tablet Take 1 tablet (150 mg total) by mouth daily. (Patient not taking: Reported on 01/03/2019)   [DISCONTINUED] SUMAtriptan  (IMITREX ) 50 MG tablet TAKE 1 TAB EVERY 2HOURS AS NEEDED MIGRAINE. MAY REPEAT IN 2HOURS IF HEADACHE PERSISTS OR RECURS. (Patient not taking: Reported on 01/03/2019)   No facility-administered encounter medications on file as of 01/03/2019.      Objective:   Vitals:   01/03/19 1404  BP: 124/80  Pulse: 85  Temp: 98.9 F (37.2 C)  Height: 5' 7 (1.702 m)  Weight: 131 lb (59.4 kg)  SpO2: 97%  TempSrc: Oral  BMI (Calculated): 20.51     Physical exam documentation is limited by delayed entry of information.

## 2024-01-20 HISTORY — PX: OTHER SURGICAL HISTORY: SHX169

## 2024-02-04 ENCOUNTER — Encounter: Payer: Self-pay | Admitting: Internal Medicine

## 2024-02-14 ENCOUNTER — Encounter: Payer: Self-pay | Admitting: Internal Medicine

## 2024-02-14 DIAGNOSIS — G40909 Epilepsy, unspecified, not intractable, without status epilepticus: Secondary | ICD-10-CM

## 2024-02-14 MED ORDER — PHENOBARBITAL 60 MG PO TABS: 60.0000 mg | ORAL_TABLET | Freq: Every day | ORAL | 1 refills | Status: AC

## 2024-02-20 ENCOUNTER — Other Ambulatory Visit: Payer: Self-pay | Admitting: Internal Medicine

## 2024-02-20 DIAGNOSIS — B9681 Helicobacter pylori [H. pylori] as the cause of diseases classified elsewhere: Secondary | ICD-10-CM | POA: Insufficient documentation

## 2024-02-20 MED ORDER — FLUCONAZOLE 150 MG PO TABS: 150.0000 mg | ORAL_TABLET | Freq: Every day | ORAL | 0 refills | Status: AC

## 2024-02-20 NOTE — Assessment & Plan Note (Signed)
 Treated by ER with levaquin , doxy and PPI .  Probiotic advised  sending fluconazole  for prn use

## 2024-03-20 ENCOUNTER — Encounter: Payer: Self-pay | Admitting: Internal Medicine

## 2024-03-20 NOTE — Telephone Encounter (Signed)
 I have printed the requisitions and placed in quick sign folder.

## 2024-03-23 ENCOUNTER — Other Ambulatory Visit: Payer: Self-pay | Admitting: Internal Medicine

## 2024-03-29 LAB — LAB REPORT - SCANNED: EGFR: 71

## 2024-04-01 ENCOUNTER — Encounter: Payer: Self-pay | Admitting: Internal Medicine

## 2024-04-02 ENCOUNTER — Other Ambulatory Visit: Payer: Self-pay | Admitting: Internal Medicine

## 2024-04-05 ENCOUNTER — Encounter: Payer: Self-pay | Admitting: Orthopedic Surgery

## 2024-04-05 NOTE — Telephone Encounter (Signed)
 That's fine. May need to repeat xrays but can discuss at her visit.

## 2024-04-05 NOTE — Progress Notes (Signed)
 "  Referring Physician:  Marylynn Verneita CROME, MD [address] [place],  [place] [address]  Primary Physician:  Marylynn Verneita CROME, MD  History of Present Illness: 04/10/2024 Ms. Barbara Odom has a history of migraines, cardiomegaly, seizure disorder, osteoporosis, hyperlipidemia, COPD.   History of ACDF C4-C5 in 2012. Also with history of lumbar fusion.   She has 4-5 weeks history of constant left sided neck/shoulder pain. No arm pain. No numbness, tingling, or weakness. She hears cracking when she turns her neck. Some improvement with ice.   She had left shoulder injection 04/09/24- has not had much improvement yet. She had left upper trapezius TPI on 03/06/24- these helped 50-60%.   She sees [facility] in [place] for shoulder pain and had injection as above.   Tobacco use: Does not smoke.   Bowel/Bladder Dysfunction: none  Conservative measures:  Physical therapy: has not participated in recently Multimodal medical therapy including regular antiinflammatories:  Baclofen , Flexeril, Medrol  Pack, Tizanidine , Meloxicam, Tramadol , Gabapentin Injections:  04/10/24- left shoulder injection  03/06/2024- Trigger Point Injection: left upper trapezius 07/31/2017- ESI L4-5 11/10/2015- ESI L4-5 02/03/2015- ESI L4-5 07/20/2010- ESI Right C7-T1 Cervical  01/29/2010- ESI Right C7-T1 Cervical   Past Surgery:  10/26/2020- L4-5 LATERAL LUMBAR INTERBODY FUSION WITH L4-5 POSTERIOR SPINAL FUSION  10/2010- CERVICAL DISCECTOMY /C4-5 ACDF   Barbara Odom has no symptoms of cervical myelopathy.  The symptoms are causing a significant impact on the patient's life.   Review of Systems:  A 10 point review of systems is negative, except for the pertinent positives and negatives detailed in the HPI.  Past Medical History: Past Medical History:  Diagnosis Date   Bronchiectasis (HCC)    Calcium  oxalate crystals in urine 06/13/2019   COPD (chronic obstructive pulmonary disease) (HCC) MAC  dx   grand mal Age [age]   adolescent, on low dose phenobarbital    History of diverticulitis of colon    Hyperlipidemia    MAI (mycobacterium avium-intracellulare) (HCC)    Migraine headache    Osteopenia    Pneumonia    multi-lobe    seasonal rhinitis    Seasonal    Past Surgical History: Past Surgical History:  Procedure Laterality Date   BREAST BIOPSY Right 2005   Negative- clip placed   BREAST EXCISIONAL BIOPSY Left 1997   Negative --- Pt states this procedure was a lumpect.   BREAST SURGERY     lumpectomy, benign    CARPAL TUNNEL RELEASE  1983   bilateral    cataract surger Left 01/2024   CERVICAL DISCECTOMY  10/2010   [facility]   EYE SURGERY  Bilateral strabismus surgery age [age]   SPINE SURGERY  Spinal fusion L4-5 Aug 8/22  r sciatic decompression    Allergies: Allergies as of 04/10/2024 - Review Complete 04/10/2024  Allergen Reaction Noted   Meloxicam Other (See Comments) 08/10/2021   Penicillins Other (See Comments) 05/11/2006   Metoprolol  Rash 05/06/2020    Medications: Outpatient Encounter Medications as of 04/10/2024  Medication Sig   albuterol  (VENTOLIN  HFA) 108 (90 Base) MCG/ACT inhaler 20.1   alendronate  (FOSAMAX ) 70 MG tablet Take 1 tablet (70 mg total) by mouth every 7 (seven) days. Take with a full glass of water on an empty stomach.   aspirin  EC 81 MG tablet Take 81 mg by mouth daily. Swallow whole.   Biotin 10 MG CAPS 0   budesonide -formoterol  (SYMBICORT ) 160-4.5 MCG/ACT inhaler Inhale 2 puffs into the lungs 2 (two) times daily.  Calcium  Carb-Cholecalciferol (CALCIUM  600+D) 600-10 MG-MCG TABS    calcium  carbonate (CALCIUM  600) 600 MG TABS tablet Take 600 mg by mouth 2 (two) times daily with a meal.   Calcium -Vitamin D -Vitamin K 500-200-40 MG-UNT-MCG CHEW 0   cetirizine (ZYRTEC) 10 MG tablet 0   clobetasol ointment (TEMOVATE) 0.05 % Apply 1 Application topically.   Cranberry-Vitamin C (CRANBERRY CONCENTRATE/VITAMINC PO) Take 4,200 mg by mouth  daily.   Methylcobalamin 5000 MCG TBDP 0   Multiple Vitamins-Minerals (PRESERVISION AREDS PO) Take 1 tablet by mouth daily.   NON FORMULARY    PHENObarbital  (LUMINAL) 60 MG tablet Take 1 tablet (60 mg total) by mouth daily.   Probiotic Product (SOLUBLE FIBER/PROBIOTICS PO) Take 1 capsule by mouth daily.   rizatriptan  (MAXALT ) 10 MG tablet TAKE 1 TABLET BY MOUTH AS NEEDED FOR MIGRAINE. MAY REPEAT IN 2 HOURS IF NEEDED   rosuvastatin  (CRESTOR ) 20 MG tablet TAKE 1 TABLET BY MOUTH EVERY OTHER DAY   tretinoin (RETIN-A) 0.1 % cream Apply 1 Application topically at bedtime.   vitamin B-12 (CYANOCOBALAMIN ) 1000 MCG tablet Take 1,000 mcg by mouth daily.   zolpidem  (AMBIEN ) 10 MG tablet TAKE 1 TABLET (10 MG TOTAL) BY MOUTH AT BEDTIME AS NEEDED FOR SLEEP   [DISCONTINUED] Cholecalciferol (VITAMIN D -3 PO) Take 2,000 Units by mouth. (Patient not taking: Reported on 04/08/2024)   [DISCONTINUED] raloxifene  (EVISTA ) 60 MG tablet TAKE 1 TABLET BY MOUTH EVERY DAY (Patient not taking: Reported on 04/08/2024)   No facility-administered encounter medications on file as of 04/10/2024.    Social History: Social History[1]  Family Medical History: Family History  Problem Relation Age of Onset   Heart disease Mother        Atrial fibrillation   Coronary artery disease Mother    Hyperlipidemia Mother    Hypertension Mother    Cancer Maternal Grandmother        colon Ca   Stroke Maternal Grandmother    Cancer Maternal Grandfather        Colon CA   COPD Maternal Grandfather    Macular degeneration Father    Congestive Heart Failure Father    Kidney disease Father    Vision loss Father    Breast cancer Neg Hx     Physical Examination: Vitals:   04/10/24 1306  BP: 130/78    General: Patient is well developed, well nourished, calm, collected, and in no apparent distress. Attention to examination is appropriate.  Respiratory: Patient is breathing without any difficulty.   NEUROLOGICAL:     Awake,  alert, oriented to person, place, and time.  Speech is clear and fluent. Fund of knowledge is appropriate.   Cranial Nerves: Pupils equal round and reactive to light.  Facial tone is symmetric.    No posterior cervical tenderness. Mild tenderness in left side of neck near occipital region.   No abnormal lesions on exposed skin.   Strength: Side Biceps Triceps Deltoid Interossei Grip Wrist Ext. Wrist Flex.  R 5 5 5 5 5 5 5   L 5 5 5 5 5 5 5    Side Iliopsoas Quads Hamstring PF DF EHL  R 5 5 5 5 5 5   L 5 5 5 5 5 5    Reflexes are 2+ and symmetric at the biceps, brachioradialis, patella and achilles.   Hoffman's is absent.  Clonus is not present.   Bilateral upper and lower extremity sensation is intact to light touch.     She has limited ROM of left shoulder with  mild pain. She has good ROM of right shoulder with no pain.   Gait is normal.     Medical Decision Making  Imaging: Cervical xray report dated 03/06/24 (films not available):  Independent visualization of imaging shows prior C4-5  ACDF, significant cervical spondylosis with adjacent segment disease of C5-6  disc degeneration with loss of disc height and osteophyte formation, with loss of cervical lordosis with no instability, no fractures, no pathologic lesions.  Assessment and Plan: Ms. Estelle has a history of ACDF C4-C5 in 2012. Also with history of lumbar fusion.   She has 4-5 weeks history of constant left sided neck/shoulder pain. No arm pain. No numbness, tingling, or weakness.   She had left shoulder injection 04/09/24- has not had much improvement yet. She had left upper trapezius TPI on 03/06/24- these helped 50-60%.   I think a lot of her pain is more myofascial in nature and that some of her pain is also left shoulder mediated.   Treatment options discussed with patient and following plan made:   - Cervical xrays with flex/ext ordered. Will message her with results.  - PT for cervical spine. Orders to  [facility] in [facility] [phone]).  - If no improvement with above, recommend she see pain management for possible repeat TPI.  - She lives 4 hours away. Will do MyChart visit for follow up in 4-6 weeks.   I spent a total of 35 minutes in face-to-face and non-face-to-face activities related to this patient's care today including review of outside records, review of imaging, review of symptoms, physical exam, discussion of differential diagnosis, discussion of treatment options, and documentation.   Thank you for involving me in the care of this patient.   Glade Boys PA-C Dept. of Neurosurgery      [1]  Social History Tobacco Use   Smoking status: Never   Smokeless tobacco: Never  Vaping Use   Vaping status: Never Used  Substance Use Topics   Alcohol use: Yes    Alcohol/week: 2.0 standard drinks of alcohol    Types: 1 Cans of beer, 1 Shots of liquor per week   Drug use: No   "

## 2024-04-08 ENCOUNTER — Ambulatory Visit: Admitting: *Deleted

## 2024-04-08 VITALS — Ht 66.5 in | Wt 132.0 lb

## 2024-04-08 DIAGNOSIS — Z0000 Encounter for general adult medical examination without abnormal findings: Secondary | ICD-10-CM | POA: Diagnosis not present

## 2024-04-08 LAB — LAB REPORT - SCANNED: PTH: 31.8

## 2024-04-08 NOTE — Progress Notes (Signed)
 "  Chief Complaint  Patient presents with   Medicare Wellness     Subjective:   Barbara Odom is a 74 y.o. female who presents for a Medicare Annual Wellness Visit.  Visit info / Clinical Intake: Medicare Wellness Visit Type:: Subsequent Annual Wellness Visit Persons participating in visit and providing information:: patient Medicare Wellness Visit Mode:: Video Since this visit was completed virtually, some vitals may be partially provided or unavailable. Missing vitals are due to the limitations of the virtual format.: Unable to obtain vitals - no equipment If Telephone or Video please confirm:: I connected with patient using audio/video enable telemedicine. I verified patient identity with two identifiers, discussed telehealth limitations, and patient agreed to proceed. Patient Location:: Home Provider Location:: Office/Home Interpreter Needed?: No Pre-visit prep was completed: yes AWV questionnaire completed by patient prior to visit?: yes Date:: 04/06/24 Living arrangements:: lives with spouse/significant other Patient's Overall Health Status Rating: good Typical amount of pain: some (shoulder pain for several years) Does pain affect daily life?: no Are you currently prescribed opioids?: no  Dietary Habits and Nutritional Risks How many meals a day?: 3 Eats fruit and vegetables daily?: yes Most meals are obtained by: preparing own meals In the last 2 weeks, have you had any of the following?: none Diabetic:: no  Functional Status Activities of Daily Living (to include ambulation/medication): Independent Ambulation: Independent Medication Administration: Independent Home Management (perform basic housework or laundry): Independent Manage your own finances?: yes Primary transportation is: driving Concerns about vision?: no *vision screening is required for WTM* Concerns about hearing?: no  Fall Screening Falls in the past year?: 0 Number of falls in past year:  0 Was there an injury with Fall?: 0 Fall Risk Category Calculator: 0 Patient Fall Risk Level: Low Fall Risk  Fall Risk Patient at Risk for Falls Due to: No Fall Risks Fall risk Follow up: Falls evaluation completed; Falls prevention discussed  Home and Transportation Safety: All rugs have non-skid backing?: N/A, no rugs All stairs or steps have railings?: yes Grab bars in the bathtub or shower?: (!) no Have non-skid surface in bathtub or shower?: yes Good home lighting?: (!) no Regular seat belt use?: yes Hospital stays in the last year:: no  Cognitive Assessment Difficulty concentrating, remembering, or making decisions? : no Will 6CIT or Mini Cog be Completed: yes What year is it?: 0 points What month is it?: 0 points Give patient an address phrase to remember (5 components): [address] [place] About what time is it?: 0 points Count backwards from 20 to 1: 0 points Say the months of the year in reverse: 0 points Repeat the address phrase from earlier: 0 points 6 CIT Score: 0 points  Advance Directives (For Healthcare) Does Patient Have a Medical Advance Directive?: Yes Does patient want to make changes to medical advance directive?: No - Patient declined Type of Advance Directive: Healthcare Power of [place]; Living will Copy of Healthcare Power of Attorney in Chart?: No - copy requested Copy of Living Will in Chart?: No - copy requested  Reviewed/Updated  Reviewed/Updated: Reviewed All (Medical, Surgical, Family, Medications, Allergies, Care Teams, Patient Goals)    Allergies (verified) Meloxicam, Penicillins, and Metoprolol    Current Medications (verified) Outpatient Encounter Medications as of 04/08/2024  Medication Sig   alendronate  (FOSAMAX ) 70 MG tablet Take 1 tablet (70 mg total) by mouth every 7 (seven) days. Take with a full glass of water on an empty stomach.   aspirin  EC 81 MG tablet  Take 81 mg by mouth daily. Swallow whole.    budesonide -formoterol  (SYMBICORT ) 160-4.5 MCG/ACT inhaler Inhale 2 puffs into the lungs 2 (two) times daily.   calcium  carbonate (CALCIUM  600) 600 MG TABS tablet Take 600 mg by mouth 2 (two) times daily with a meal.   Cranberry-Vitamin C (CRANBERRY CONCENTRATE/VITAMINC PO) Take 4,200 mg by mouth daily.   Multiple Vitamins-Minerals (PRESERVISION AREDS PO) Take 1 tablet by mouth daily.   NON FORMULARY    PHENObarbital  (LUMINAL) 60 MG tablet Take 1 tablet (60 mg total) by mouth daily.   Probiotic Product (SOLUBLE FIBER/PROBIOTICS PO) Take 1 capsule by mouth daily.   rizatriptan  (MAXALT ) 10 MG tablet TAKE 1 TABLET BY MOUTH AS NEEDED FOR MIGRAINE. MAY REPEAT IN 2 HOURS IF NEEDED   rosuvastatin  (CRESTOR ) 20 MG tablet TAKE 1 TABLET BY MOUTH EVERY OTHER DAY   tretinoin (RETIN-A) 0.1 % cream Apply 1 Application topically at bedtime.   vitamin B-12 (CYANOCOBALAMIN ) 1000 MCG tablet Take 1,000 mcg by mouth daily.   zolpidem  (AMBIEN ) 10 MG tablet TAKE 1 TABLET (10 MG TOTAL) BY MOUTH AT BEDTIME AS NEEDED FOR SLEEP   raloxifene  (EVISTA ) 60 MG tablet TAKE 1 TABLET BY MOUTH EVERY DAY (Patient not taking: Reported on 04/08/2024)   [DISCONTINUED] Cholecalciferol (VITAMIN D -3 PO) Take 2,000 Units by mouth. (Patient not taking: Reported on 04/08/2024)   No facility-administered encounter medications on file as of 04/08/2024.    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   BREAST SURGERY     lumpectomy, benign    CARPAL TUNNEL RELEASE  1983   bilateral    cataract surger Left 01/2024   CERVICAL DISCECTOMY  10/2010   [place]   EYE SURGERY  Bilateral strabismus surgery age [age]   SPINE SURGERY  Spinal fusion L4-5 Aug 8/22  r sciatic decompression   Family History  Problem Relation Age of Onset   Heart disease Mother        Atrial fibrillation   Coronary artery disease Mother    Hyperlipidemia Mother    Hypertension Mother    Cancer Maternal Grandmother        colon Ca   Stroke Maternal Grandmother    Cancer Maternal Grandfather        Colon CA   COPD Maternal Grandfather    Macular degeneration Father    Congestive Heart Failure Father    Kidney disease Father    Vision loss Father    Breast cancer Neg Hx    Social History   Occupational History   Occupation: BEHAVIOR MED    Employer: [employer]  Tobacco Use   Smoking status: Never   Smokeless tobacco: Never  Vaping Use   Vaping status: Never Used  Substance and Sexual Activity   Alcohol use: Yes  Alcohol/week: 2.0 standard drinks of alcohol    Types: 1 Cans of beer, 1 Shots of liquor per week   Drug use: No   Sexual activity: Yes    Partners: Male    Comment: 1st intercourse- 84, partners- 1, married- 40 yrs    Tobacco Counseling Counseling given: Not Answered  SDOH Screenings   Food Insecurity: No Food Insecurity (04/06/2024)  Housing: Low Risk (04/08/2024)  Transportation Needs: No Transportation Needs (04/06/2024)  Utilities: Not At Risk (06/19/2023)   Received from [facility]  Alcohol Screen: Low Risk (04/06/2024)  Depression (PHQ2-9): Low Risk (04/08/2024)  Financial Resource Strain: Low Risk (04/06/2024)  Physical Activity: Sufficiently Active (04/06/2024)  Social Connections: Socially Integrated (04/06/2024)  Stress: No Stress Concern Present (04/06/2024)  Tobacco Use: Low Risk (04/08/2024)   Health Literacy: Adequate Health Literacy (04/08/2024)   See flowsheets for full screening details  Depression Screen PHQ 2 & 9 Depression Scale- Over the past 2 weeks, how often have you been bothered by any of the following problems? Little interest or pleasure in doing things: 0 Feeling down, depressed, or hopeless (PHQ Adolescent also includes...irritable): 0 PHQ-2 Total Score: 0 Trouble falling or staying asleep, or sleeping too much: 1 Feeling tired or having little energy: 0 Poor appetite or overeating (PHQ Adolescent also includes...weight loss): 0 Feeling bad about yourself - or that you are a failure or have let yourself or your family down: 0 Trouble concentrating on things, such as reading the newspaper or watching television (PHQ Adolescent also includes...like school work): 0 Moving or speaking so slowly that other people could have noticed. Or the opposite - being so fidgety or restless that you have been moving around a lot more than usual: 0 Thoughts that you would be better off dead, or of hurting yourself in some way: 0 PHQ-9 Total Score: 1 If you checked off any problems, how difficult have these problems made it for you to do your work, take care of things at home, or get along with other people?: Not difficult at all     Goals Addressed             This Visit's Progress    Patient Stated       Continue to walk to daily and lift weights             Objective:    Today's Vitals   04/08/24 1350  Weight: 132 lb (59.9 kg)  Height: 5' 6.5 (1.689 m)   Body mass index is 20.99 kg/m.  Hearing/Vision screen Hearing Screening - Comments:: No issues Vision Screening - Comments:: Readers, [employer], Up to date Immunizations and Health Maintenance Health Maintenance  Topic Date Due   COVID-19 Vaccine (6 - 2025-26 season) 11/20/2023   Mammogram  08/08/2024   Medicare Annual Wellness (AWV)  04/08/2025   Colonoscopy  12/28/2031   DTaP/Tdap/Td (3 -  Td or Tdap) 11/23/2032   Pneumococcal Vaccine: 50+ Years  Completed   Influenza Vaccine  Completed   Bone Density Scan  Completed   Hepatitis C Screening  Completed   Zoster Vaccines- Shingrix  Completed   Meningococcal B Vaccine  Aged Out        Assessment/Plan:  This is a routine wellness examination for [place].  Patient Care Team: Marylynn Verneita CROME, MD as PCP - General (Internal Medicine)  I have personally reviewed and noted the following in the patients chart:   Medical and social history Use of alcohol, tobacco or illicit drugs  Current medications and supplements including opioid prescriptions. Functional ability and status Nutritional status Physical activity Advanced directives List of other physicians Hospitalizations, surgeries, and ER visits in previous 12 months Vitals Screenings to include cognitive, depression, and falls Referrals and appointments  No orders of the defined types were placed in this encounter.  In addition, I have reviewed and discussed with patient certain preventive protocols, quality metrics, and best practice recommendations. A written personalized care plan for preventive services as well as general preventive health recommendations were provided to patient.   Angeline Fredericks, LPN   8/80/7973   Return in 1 year (on 04/08/2025).  After Visit Summary: (MyChart) Due to this being a telephonic visit, the after visit summary with patients personalized plan was offered to patient via MyChart   Nurse Notes: Patient will consider getting a covid vaccine. "

## 2024-04-08 NOTE — Patient Instructions (Signed)
" ° °  Barbara Odom,  Thank you for taking the time for your Medicare Wellness Visit. I appreciate your continued commitment to your health goals. Please review the care plan we discussed, and feel free to reach out if I can assist you further.  Please note that Annual Wellness Visits do not include a physical exam. Some assessments may be limited, especially if the visit was conducted virtually. If needed, we may recommend an in-person follow-up with your provider.  Ongoing Care Seeing your primary care provider every 3 to 6 months helps us  monitor your health and provide consistent, personalized care.  Consider updating your covid vaccine.  Referrals If a referral was made during today's visit and you haven't received any updates within two weeks, please contact the referred provider directly to check on the status.  Recommended Screenings:  Health Maintenance  Topic Date Due   COVID-19 Vaccine (6 - 2025-26 season) 11/20/2023   Breast Cancer Screening  08/08/2024   Medicare Annual Wellness Visit  04/08/2025   Colon Cancer Screening  12/28/2031   DTaP/Tdap/Td vaccine (3 - Td or Tdap) 11/23/2032   Pneumococcal Vaccine for age over 77  Completed   Flu Shot  Completed   Osteoporosis screening with Bone Density Scan  Completed   Hepatitis C Screening  Completed   Zoster (Shingles) Vaccine  Completed   Meningitis B Vaccine  Aged Out       04/08/2024    2:02 PM  Advanced Directives  Does Patient Have a Medical Advance Directive? Yes  Type of Estate Agent of [place];Living will  Does patient want to make changes to medical advance directive? No - Patient declined  Copy of Healthcare Power of Attorney in Chart? No - copy requested    Vision: Annual vision screenings are recommended for early detection of glaucoma, cataracts, and diabetic retinopathy. These exams can also reveal signs of chronic conditions such as diabetes and high blood pressure.  Dental: Annual  dental screenings help detect early signs of oral cancer, gum disease, and other conditions linked to overall health, including heart disease and diabetes.  Please see the attached documents for additional preventive care recommendations.    "

## 2024-04-10 ENCOUNTER — Ambulatory Visit: Payer: Self-pay | Admitting: Internal Medicine

## 2024-04-10 ENCOUNTER — Encounter: Payer: Self-pay | Admitting: Orthopedic Surgery

## 2024-04-10 ENCOUNTER — Ambulatory Visit: Payer: Medicare Other | Admitting: Internal Medicine

## 2024-04-10 ENCOUNTER — Ambulatory Visit: Admitting: Orthopedic Surgery

## 2024-04-10 ENCOUNTER — Ambulatory Visit

## 2024-04-10 ENCOUNTER — Encounter: Payer: Self-pay | Admitting: Internal Medicine

## 2024-04-10 VITALS — BP 130/78 | Ht 67.0 in | Wt 132.0 lb

## 2024-04-10 VITALS — BP 130/72 | HR 91 | Ht 67.0 in | Wt 135.0 lb

## 2024-04-10 DIAGNOSIS — Z981 Arthrodesis status: Secondary | ICD-10-CM

## 2024-04-10 DIAGNOSIS — Z0000 Encounter for general adult medical examination without abnormal findings: Secondary | ICD-10-CM | POA: Diagnosis not present

## 2024-04-10 DIAGNOSIS — M542 Cervicalgia: Secondary | ICD-10-CM

## 2024-04-10 DIAGNOSIS — M7918 Myalgia, other site: Secondary | ICD-10-CM | POA: Diagnosis not present

## 2024-04-10 DIAGNOSIS — G40909 Epilepsy, unspecified, not intractable, without status epilepticus: Secondary | ICD-10-CM | POA: Diagnosis not present

## 2024-04-10 DIAGNOSIS — E559 Vitamin D deficiency, unspecified: Secondary | ICD-10-CM | POA: Diagnosis not present

## 2024-04-10 DIAGNOSIS — J479 Bronchiectasis, uncomplicated: Secondary | ICD-10-CM

## 2024-04-10 DIAGNOSIS — M47812 Spondylosis without myelopathy or radiculopathy, cervical region: Secondary | ICD-10-CM

## 2024-04-10 DIAGNOSIS — M816 Localized osteoporosis [Lequesne]: Secondary | ICD-10-CM

## 2024-04-10 DIAGNOSIS — M25512 Pain in left shoulder: Secondary | ICD-10-CM | POA: Diagnosis not present

## 2024-04-10 DIAGNOSIS — K760 Fatty (change of) liver, not elsewhere classified: Secondary | ICD-10-CM | POA: Diagnosis not present

## 2024-04-10 DIAGNOSIS — E785 Hyperlipidemia, unspecified: Secondary | ICD-10-CM | POA: Diagnosis not present

## 2024-04-10 DIAGNOSIS — M7582 Other shoulder lesions, left shoulder: Secondary | ICD-10-CM | POA: Diagnosis not present

## 2024-04-10 MED ORDER — ROSUVASTATIN CALCIUM 20 MG PO TABS: 20.0000 mg | ORAL_TABLET | ORAL | 3 refills | Status: AC

## 2024-04-10 NOTE — Progress Notes (Unsigned)
 Patient ID: Barbara Odom, female    DOB: Dec 21, 1950  Age: 74 y.o. MRN: [id number]  The patient is here for annual  wellness examination and management of other chronic and acute problems.   The risk factors are reflected in the social history.  The roster of all physicians providing medical care to patient - is listed in the Snapshot section of the chart.  Activities of daily living:  The patient is 100% independent in all ADLs: dressing, toileting, feeding as well as independent mobility  Home safety : The patient has smoke detectors in the home. They wear seatbelts.  There are no firearms at home. There is no violence in the home.   There is no risks for hepatitis, STDs or HIV. There is no   history of blood transfusion. They have no travel history to infectious disease endemic areas of the world.  The patient has seen their dentist in the last six month. They have seen their eye doctor in the last year. They admit to slight hearing difficulty with regard to whispered voices and some television programs.  They have deferred audiologic testing in the last year.  They do not  have excessive sun exposure. Discussed the need for sun protection: hats, long sleeves and use of sunscreen if there is significant sun exposure.   Diet: the importance of a healthy diet is discussed. They do have a healthy diet.  The benefits of regular aerobic exercise were discussed. She walks 4 times per week ,  20 minutes.   Depression screen: there are no signs or vegative symptoms of depression- irritability, change in appetite, anhedonia, sadness/tearfullness.  Cognitive assessment: the patient manages all their financial and personal affairs and is actively engaged. They could relate day,date,year and events; recalled 2/3 objects at 3 minutes; performed clock-face test normally.  The following portions of the patient's history were reviewed and updated as appropriate: allergies, current medications, past  family history, past medical history,  past surgical history, past social history  and problem list.  Visual acuity was not assessed per patient preference since she has regular follow up with her ophthalmologist. Hearing and body mass index were assessed and reviewed.   During the course of the visit the patient was educated and counseled about appropriate screening and preventive services including : fall prevention , diabetes screening, nutrition counseling, colorectal cancer screening, and recommended immunizations.    CC: The primary encounter diagnosis was Rotator cuff tendonitis, left. Diagnoses of Hepatic steatosis, Hyperlipidemia LDL goal <160, Vitamin D  deficiency, Seizure disorder (HCC), Localized osteoporosis without current pathological fracture, Encounter for preventive health examination, and Acquired bronchiectasis (HCC) were also pertinent to this visit.  1) multiple orthopedic issues : [facility] for left shoulder and neurosurgery for cervical radiculitis. Plain films done today reports pending   2) bronchiectasis with <AI presumed  3) plantar fasciitis right foot: resolved spontaneously in October   4) prolonged N/V  due to gastroenteritis,   started nov 29 required IV fluids .  CT contrast . For duodenitis.  Treated for H pylori  by ER physician .  Had flu on Dec 22 from grandkids..  Treated for bronchitis and costochondritis in Jan 2 by urgent care with 16 day steroid taper and doxycycline  . [facility] PULMONOLOGIST WAS  too busy to see her n  Walking -3 miles every other day going to [employer]        History A[place] has a past medical history of Bronchiectasis (HCC), Calcium  oxalate  crystals in urine (06/13/2019), COPD (chronic obstructive pulmonary disease) (HCC) (MAC dx), grand mal (Age [age]), History of diverticulitis of colon, Hyperlipidemia, MAI (mycobacterium avium-intracellulare) (HCC), Migraine headache, Osteopenia, Pneumonia, and seasonal rhinitis.    She has a past surgical history that includes Carpal tunnel release (1983); Breast surgery; Cervical discectomy (10/2010); Breast excisional biopsy (Left, 1997); Breast biopsy (Right, 2005); Eye surgery (Bilateral strabismus surgery age 68-13); Spine surgery (Spinal fusion L4-5 Aug 8/22  r sciatic decompression); and cataract surger (Left, 01/2024).   Her family history includes COPD in her maternal grandfather; Cancer in her maternal grandfather and maternal grandmother; Congestive Heart Failure in her father; Coronary artery disease in her mother; Heart disease in her mother; Hyperlipidemia in her mother; Hypertension in her mother; Kidney disease in her father; Macular degeneration in her father; Stroke in her maternal grandmother; Vision loss in her father.She reports that she has never smoked. She has never used smokeless tobacco. She reports current alcohol use of about 2.0 standard drinks of alcohol per week. She reports that she does not use drugs.  Outpatient Medications Prior to Visit  Medication Sig Dispense Refill   albuterol  (VENTOLIN  HFA) 108 (90 Base) MCG/ACT inhaler 20.1     alendronate  (FOSAMAX ) 70 MG tablet Take 1 tablet (70 mg total) by mouth every 7 (seven) days. Take with a full glass of water on an empty stomach. 4 tablet 11   aspirin  EC 81 MG tablet Take 81 mg by mouth daily. Swallow whole.     Biotin 10 MG CAPS 0     budesonide -formoterol  (SYMBICORT ) 160-4.5 MCG/ACT inhaler Inhale 2 puffs into the lungs 2 (two) times daily.     calcium  carbonate (CALCIUM  600) 600 MG TABS tablet Take 600 mg by mouth 2 (two) times daily with a meal.     Calcium -Vitamin D -Vitamin K 500-200-40 MG-UNT-MCG CHEW 0     cetirizine (ZYRTEC) 10 MG tablet 0     clobetasol ointment (TEMOVATE) 0.05 % Apply 1 Application topically.     Cranberry-Vitamin C (CRANBERRY CONCENTRATE/VITAMINC PO) Take 4,200 mg by mouth daily.     NON FORMULARY      PHENObarbital  (LUMINAL) 60 MG tablet Take 1 tablet (60 mg  total) by mouth daily. 90 tablet 1   Probiotic Product (SOLUBLE FIBER/PROBIOTICS PO) Take 1 capsule by mouth daily.     rizatriptan  (MAXALT ) 10 MG tablet TAKE 1 TABLET BY MOUTH AS NEEDED FOR MIGRAINE. MAY REPEAT IN 2 HOURS IF NEEDED 9 tablet 4   tretinoin (RETIN-A) 0.1 % cream Apply 1 Application topically at bedtime.     vitamin B-12 (CYANOCOBALAMIN ) 1000 MCG tablet Take 1,000 mcg by mouth daily.     zolpidem  (AMBIEN ) 10 MG tablet TAKE 1 TABLET (10 MG TOTAL) BY MOUTH AT BEDTIME AS NEEDED FOR SLEEP 30 tablet 5   Methylcobalamin 5000 MCG TBDP 0     rosuvastatin  (CRESTOR ) 20 MG tablet TAKE 1 TABLET BY MOUTH EVERY OTHER DAY 45 tablet 1   Calcium  Carb-Cholecalciferol (CALCIUM  600+D) 600-10 MG-MCG TABS  (Patient not taking: Reported on 04/10/2024)     Multiple Vitamins-Minerals (PRESERVISION AREDS PO) Take 1 tablet by mouth daily.     No facility-administered medications prior to visit.    Review of Systems  Objective:  BP 130/72   Pulse 91   Ht 5' 7 (1.702 m)   Wt 135 lb (61.2 kg)   SpO2 95%   BMI 21.14 kg/m   Physical Exam  Assessment & Plan:  Rotator cuff tendonitis, left  Assessment & Plan: Under treatemnt by [facility], last visit yesterday for  Left shoulder impingement vs early adhesive capsulitis. Left shoulder OA .  She was given a CSI awith kenalog  and lidocaine. Patient tolerated procedure well and left ambulating and in NAD. Ice and OTC medications utilized   advised to continue shoulder HEP. F/u in 6 weeks for re-evaluation. Consider MRI if symptoms persist or worsen. Patient understands and agrees with treatment plan.     Hepatic steatosis -     Comprehensive metabolic panel with GFR  Hyperlipidemia LDL goal <160 Assessment & Plan: 10 yr risk assessment for cardiac events  using the AHA risk calculator is 5% , and there is no evidence of atherosclerosis by prior imaging.  No treatment required.     Orders: -     Rosuvastatin  Calcium ; Take 1 tablet (20 mg total) by  mouth every other day.  Dispense: 45 tablet; Refill: 3 -     Lipid panel -     CT CARDIAC SCORING (SELF PAY ONLY); Future  Vitamin D  deficiency -     VITAMIN D  25 Hydroxy (Vit-D Deficiency, Fractures)  Seizure disorder [facility]) Assessment & Plan: Her seizure disorder was diagnosed during childhood and has been Managed since adolescence with phenobarbital .  She has been asymptomatic  and levels have been therapeutic    Localized osteoporosis without current pathological fracture Assessment & Plan: Continued loss of BMD by repeat DEXA, likely due to use of phenobarbital .  Tolerating  alendronate .  Checking vitamin D  and PTH    Encounter for preventive health examination Assessment & Plan: age appropriate education and counseling updated, referrals for preventative services and immunizations addressed, dietary and smoking counseling addressed, most recent labs reviewed.  I have personally reviewed and have noted:   1) the patient's medical and social history 2) The pt's use of alcohol, tobacco, and illicit drugs 3) The patient's current medications and supplements 4) Functional ability including ADL's, fall risk, home safety risk, hearing and visual impairment 5) Diet and physical activities 6) Evidence for depression or mood disorder 7) The patient's height, weight, and BMI have been recorded in the chart.     I have made referrals, and provided counseling and education based on review of the above    Acquired bronchiectasis [facility]) Assessment & Plan: Previously  managed by Dr Tamea in [place] and Dr Nancey in [place].   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  She is using Anoro 3 days per week  .  Recent treatment  by urgent care with oxycodone  and prednisone         Follow-up: No follow-ups on file.   Verneita LITTIE Kettering, MD

## 2024-04-10 NOTE — Assessment & Plan Note (Signed)
 Under treatemnt by [facility], last visit yesterday for  Left shoulder impingement vs early adhesive capsulitis. Left shoulder OA .  She was given a CSI awith kenalog  and lidocaine. Patient tolerated procedure well and left ambulating and in NAD. Ice and OTC medications utilized   advised to continue shoulder HEP. F/u in 6 weeks for re-evaluation. Consider MRI if symptoms persist or worsen. Patient understands and agrees with treatment plan.

## 2024-04-11 LAB — COMPREHENSIVE METABOLIC PANEL WITH GFR
ALT: 14 U/L (ref 3–35)
AST: 18 U/L (ref 5–37)
Albumin: 4.1 g/dL (ref 3.5–5.2)
Alkaline Phosphatase: 40 U/L (ref 39–117)
BUN: 20 mg/dL (ref 6–23)
CO2: 29 meq/L (ref 19–32)
Calcium: 9.5 mg/dL (ref 8.4–10.5)
Chloride: 104 meq/L (ref 96–112)
Creatinine, Ser: 0.82 mg/dL (ref 0.40–1.20)
GFR: 71.9 mL/min
Glucose, Bld: 106 mg/dL — ABNORMAL HIGH (ref 70–99)
Potassium: 4.5 meq/L (ref 3.5–5.1)
Sodium: 140 meq/L (ref 135–145)
Total Bilirubin: 0.3 mg/dL (ref 0.2–1.2)
Total Protein: 7.3 g/dL (ref 6.0–8.3)

## 2024-04-11 LAB — LIPID PANEL
Cholesterol: 165 mg/dL (ref 28–200)
HDL: 86.5 mg/dL
LDL Cholesterol: 69 mg/dL (ref 10–99)
NonHDL: 78.78
Total CHOL/HDL Ratio: 2
Triglycerides: 51 mg/dL (ref 10.0–149.0)
VLDL: 10.2 mg/dL (ref 0.0–40.0)

## 2024-04-11 LAB — VITAMIN D 25 HYDROXY (VIT D DEFICIENCY, FRACTURES): VITD: 58.71 ng/mL (ref 30.00–100.00)

## 2024-04-11 NOTE — Assessment & Plan Note (Signed)
 Continued loss of BMD by repeat DEXA, likely due to use of phenobarbital .  Tolerating  alendronate .  Checking vitamin D  and PTH

## 2024-04-11 NOTE — Assessment & Plan Note (Signed)

## 2024-04-11 NOTE — Assessment & Plan Note (Signed)
 Her seizure disorder was diagnosed during childhood and has been Managed since adolescence with phenobarbital .  She has been asymptomatic  and levels have been therapeutic

## 2024-04-11 NOTE — Assessment & Plan Note (Signed)
 10 yr risk assessment for cardiac events  using the AHA risk calculator is 5% , and there is no evidence of atherosclerosis by prior imaging.  No treatment required.

## 2024-04-11 NOTE — Assessment & Plan Note (Signed)
 Previously  managed by Dr Tamea in [place] and Dr Nancey in [place].   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  She is using Anoro 3 days per week  .  Recent treatment  by urgent care with oxycodone  and prednisone 

## 2024-04-13 ENCOUNTER — Ambulatory Visit: Payer: Self-pay | Admitting: Internal Medicine

## 2024-04-16 ENCOUNTER — Encounter: Payer: Self-pay | Admitting: Orthopedic Surgery

## 2024-04-16 NOTE — Telephone Encounter (Signed)
 Cervical xrays dated 04/10/24:  FINDINGS:   BONES: Vertebral body heights are maintained. Postoperative changes with anterior plate and screw fixation and intervertebral fusion at C4-C5. The fused segment appears intact with normal alignment of the cervical spine. Slight anterior subluxation of C6 on C5 during flexion, which reduces during extension. This could indicate ligamentous laxity. No vertebral compression deformities. No focal bone lesion or bone destruction.   DISCS AND DEGENERATIVE CHANGES: Intervertebral fusion at C4-C5. Mild degenerative changes at C5-C6 with narrowed interspace and small osteophyte formation.   SOFT TISSUES: No prevertebral soft tissue swelling. The visualized lungs appear clear.   IMPRESSION: 1. Slight anterior subluxation of C6 during flexion, which reduces during extension, possibly indicating ligamentous laxity. 2. Postoperative changes with anterior plate and screw fixation and intervertebral fusion at C4-5, with intact fused segment and normal static cervical alignment. 3. Mild degenerative changes at C5-6 with narrowed interspace and small osteophyte formation.   Electronically signed by: Elsie Gravely MD 04/14/2024 08:00 PM EST RP Workstation: [id number]  Above xrays reviewed with Dr. Clois. She may have a little instability at C5-C6 and C6-C7. No change to plan we made at her visit.   Message sent to patient.

## 2024-05-24 ENCOUNTER — Telehealth: Admitting: Orthopedic Surgery

## 2025-04-14 ENCOUNTER — Ambulatory Visit

## 2025-04-14 ENCOUNTER — Encounter: Admitting: Internal Medicine
